# Patient Record
Sex: Male | Born: 1965
Health system: Southern US, Community
[De-identification: ages and names within clinical notes are randomized; demographics above are authoritative.]

## PROBLEM LIST (undated history)

## (undated) DIAGNOSIS — I2510 Atherosclerotic heart disease of native coronary artery without angina pectoris: Secondary | ICD-10-CM

## (undated) DIAGNOSIS — Z720 Tobacco use: Secondary | ICD-10-CM

## (undated) DIAGNOSIS — E119 Type 2 diabetes mellitus without complications: Secondary | ICD-10-CM

## (undated) DIAGNOSIS — E1169 Type 2 diabetes mellitus with other specified complication: Secondary | ICD-10-CM

## (undated) DIAGNOSIS — E785 Hyperlipidemia, unspecified: Secondary | ICD-10-CM

## (undated) HISTORY — DX: Type 2 diabetes mellitus without complications: E11.9

## (undated) HISTORY — DX: Type 2 diabetes mellitus with other specified complication: E11.69

## (undated) HISTORY — DX: Hyperlipidemia, unspecified: E78.5

---

## 2015-08-06 DIAGNOSIS — E785 Hyperlipidemia, unspecified: Secondary | ICD-10-CM

## 2015-08-06 DIAGNOSIS — E1169 Type 2 diabetes mellitus with other specified complication: Secondary | ICD-10-CM

## 2015-08-06 DIAGNOSIS — E119 Type 2 diabetes mellitus without complications: Secondary | ICD-10-CM

## 2015-08-06 HISTORY — DX: Type 2 diabetes mellitus without complications: E11.9

## 2015-08-06 HISTORY — DX: Type 2 diabetes mellitus with other specified complication: E11.69

## 2015-08-06 HISTORY — DX: Hyperlipidemia, unspecified: E78.5

## 2015-08-28 ENCOUNTER — Encounter (HOSPITAL_COMMUNITY)
Admission: EM | Disposition: A | Discharge status: Home / Self Care | Payer: Self-pay | Source: Home / Self Care | Attending: Cardiothoracic Surgery

## 2015-08-28 ENCOUNTER — Inpatient Hospital Stay (HOSPITAL_BASED_OUTPATIENT_CLINIC_OR_DEPARTMENT_OTHER)
Admission: EM | Admit: 2015-08-28 | Discharge: 2015-09-05 | DRG: 234 | Disposition: A | Discharge status: Home / Self Care | Payer: BLUE CROSS/BLUE SHIELD | Attending: Cardiothoracic Surgery | Admitting: Cardiothoracic Surgery

## 2015-08-28 ENCOUNTER — Inpatient Hospital Stay (HOSPITAL_COMMUNITY): Payer: BLUE CROSS/BLUE SHIELD

## 2015-08-28 ENCOUNTER — Encounter (HOSPITAL_BASED_OUTPATIENT_CLINIC_OR_DEPARTMENT_OTHER): Payer: Self-pay | Admitting: Emergency Medicine

## 2015-08-28 ENCOUNTER — Other Ambulatory Visit: Payer: Self-pay | Admitting: *Deleted

## 2015-08-28 ENCOUNTER — Emergency Department (HOSPITAL_BASED_OUTPATIENT_CLINIC_OR_DEPARTMENT_OTHER): Payer: BLUE CROSS/BLUE SHIELD

## 2015-08-28 DIAGNOSIS — J90 Pleural effusion, not elsewhere classified: Secondary | ICD-10-CM | POA: Diagnosis not present

## 2015-08-28 DIAGNOSIS — I209 Angina pectoris, unspecified: Secondary | ICD-10-CM

## 2015-08-28 DIAGNOSIS — R079 Chest pain, unspecified: Secondary | ICD-10-CM

## 2015-08-28 DIAGNOSIS — E8770 Fluid overload, unspecified: Secondary | ICD-10-CM | POA: Diagnosis not present

## 2015-08-28 DIAGNOSIS — D62 Acute posthemorrhagic anemia: Secondary | ICD-10-CM | POA: Diagnosis not present

## 2015-08-28 DIAGNOSIS — I2510 Atherosclerotic heart disease of native coronary artery without angina pectoris: Secondary | ICD-10-CM

## 2015-08-28 DIAGNOSIS — Z720 Tobacco use: Secondary | ICD-10-CM

## 2015-08-28 DIAGNOSIS — R0602 Shortness of breath: Secondary | ICD-10-CM | POA: Diagnosis not present

## 2015-08-28 DIAGNOSIS — R072 Precordial pain: Secondary | ICD-10-CM | POA: Diagnosis not present

## 2015-08-28 DIAGNOSIS — E119 Type 2 diabetes mellitus without complications: Secondary | ICD-10-CM | POA: Diagnosis present

## 2015-08-28 DIAGNOSIS — R0789 Other chest pain: Secondary | ICD-10-CM | POA: Diagnosis not present

## 2015-08-28 DIAGNOSIS — I080 Rheumatic disorders of both mitral and aortic valves: Secondary | ICD-10-CM | POA: Diagnosis not present

## 2015-08-28 DIAGNOSIS — Z8249 Family history of ischemic heart disease and other diseases of the circulatory system: Secondary | ICD-10-CM | POA: Diagnosis not present

## 2015-08-28 DIAGNOSIS — J9811 Atelectasis: Secondary | ICD-10-CM | POA: Diagnosis not present

## 2015-08-28 DIAGNOSIS — Z01818 Encounter for other preprocedural examination: Secondary | ICD-10-CM | POA: Diagnosis not present

## 2015-08-28 DIAGNOSIS — I200 Unstable angina: Secondary | ICD-10-CM | POA: Insufficient documentation

## 2015-08-28 DIAGNOSIS — E1165 Type 2 diabetes mellitus with hyperglycemia: Secondary | ICD-10-CM

## 2015-08-28 DIAGNOSIS — E785 Hyperlipidemia, unspecified: Secondary | ICD-10-CM | POA: Diagnosis not present

## 2015-08-28 DIAGNOSIS — F17210 Nicotine dependence, cigarettes, uncomplicated: Secondary | ICD-10-CM | POA: Diagnosis not present

## 2015-08-28 DIAGNOSIS — J449 Chronic obstructive pulmonary disease, unspecified: Secondary | ICD-10-CM | POA: Diagnosis present

## 2015-08-28 DIAGNOSIS — IMO0002 Reserved for concepts with insufficient information to code with codable children: Secondary | ICD-10-CM

## 2015-08-28 DIAGNOSIS — I252 Old myocardial infarction: Secondary | ICD-10-CM

## 2015-08-28 DIAGNOSIS — Z951 Presence of aortocoronary bypass graft: Secondary | ICD-10-CM

## 2015-08-28 DIAGNOSIS — I214 Non-ST elevation (NSTEMI) myocardial infarction: Secondary | ICD-10-CM | POA: Diagnosis not present

## 2015-08-28 DIAGNOSIS — I517 Cardiomegaly: Secondary | ICD-10-CM | POA: Diagnosis not present

## 2015-08-28 DIAGNOSIS — I25110 Atherosclerotic heart disease of native coronary artery with unstable angina pectoris: Secondary | ICD-10-CM | POA: Diagnosis present

## 2015-08-28 HISTORY — DX: Tobacco use: Z72.0

## 2015-08-28 HISTORY — PX: CARDIAC CATHETERIZATION: SHX172

## 2015-08-28 LAB — COMPREHENSIVE METABOLIC PANEL
ALT: 20 U/L (ref 17–63)
AST: 23 U/L (ref 15–41)
Albumin: 3.5 g/dL (ref 3.5–5.0)
Alkaline Phosphatase: 87 U/L (ref 38–126)
Anion gap: 7 (ref 5–15)
BUN: 13 mg/dL (ref 6–20)
CHLORIDE: 106 mmol/L (ref 101–111)
CO2: 22 mmol/L (ref 22–32)
CREATININE: 0.85 mg/dL (ref 0.61–1.24)
Calcium: 9.2 mg/dL (ref 8.9–10.3)
GFR calc non Af Amer: >60 mL/min (ref >60)
Glucose, Bld: 243 mg/dL — ABNORMAL HIGH (ref 65–99)
POTASSIUM: 3.7 mmol/L (ref 3.5–5.1)
SODIUM: 135 mmol/L (ref 135–145)
Total Bilirubin: 0.7 mg/dL (ref 0.3–1.2)
Total Protein: 6.7 g/dL (ref 6.5–8.1)

## 2015-08-28 LAB — SURGICAL PCR SCREEN
MRSA, PCR: NEGATIVE
Staphylococcus aureus: NEGATIVE

## 2015-08-28 LAB — CBC WITH DIFFERENTIAL/PLATELET
Basophils Absolute: 0.1 10*3/uL (ref 0.0–0.1)
Basophils Relative: 1 %
EOS ABS: 0.5 10*3/uL (ref 0.0–0.7)
Eosinophils Relative: 5 %
HCT: 44.7 % (ref 39.0–52.0)
HEMOGLOBIN: 15.7 g/dL (ref 13.0–17.0)
LYMPHS ABS: 3.2 10*3/uL (ref 0.7–4.0)
LYMPHS PCT: 30 %
MCH: 30.7 pg (ref 26.0–34.0)
MCHC: 35.1 g/dL (ref 30.0–36.0)
MCV: 87.5 fL (ref 78.0–100.0)
MONOS PCT: 7 %
Monocytes Absolute: 0.7 10*3/uL (ref 0.1–1.0)
NEUTROS PCT: 57 %
Neutro Abs: 6.3 10*3/uL (ref 1.7–7.7)
Platelets: 362 10*3/uL (ref 150–400)
RBC: 5.11 MIL/uL (ref 4.22–5.81)
RDW: 13.6 % (ref 11.5–15.5)
WBC: 10.9 10*3/uL — AB (ref 4.0–10.5)

## 2015-08-28 LAB — APTT: APTT: 29 s (ref 24–37)

## 2015-08-28 LAB — ECHOCARDIOGRAM COMPLETE
Height: 65 in
Weight: 3120 oz

## 2015-08-28 LAB — TSH: TSH: 1.742 u[IU]/mL (ref 0.350–4.500)

## 2015-08-28 LAB — URINALYSIS, ROUTINE W REFLEX MICROSCOPIC
Bilirubin Urine: NEGATIVE
Glucose, UA: 500 mg/dL — AB
Hgb urine dipstick: NEGATIVE
Ketones, ur: NEGATIVE mg/dL
Leukocytes, UA: NEGATIVE
Nitrite: NEGATIVE
Protein, ur: NEGATIVE mg/dL
Specific Gravity, Urine: 1.021 (ref 1.005–1.030)
pH: 5.5 (ref 5.0–8.0)

## 2015-08-28 LAB — MAGNESIUM: MAGNESIUM: 1.7 mg/dL (ref 1.7–2.4)

## 2015-08-28 LAB — TROPONIN I
TROPONIN I: 0.31 ng/mL — AB (ref <0.031)
Troponin I: <0.03 ng/mL (ref <0.031)
Troponin I: 0.17 ng/mL — ABNORMAL HIGH (ref <0.031)
Troponin I: 0.18 ng/mL — ABNORMAL HIGH (ref <0.031)

## 2015-08-28 LAB — GLUCOSE, CAPILLARY
GLUCOSE-CAPILLARY: 203 mg/dL — AB (ref 65–99)
Glucose-Capillary: 125 mg/dL — ABNORMAL HIGH (ref 65–99)

## 2015-08-28 LAB — PROTIME-INR
INR: 1.02 (ref 0.00–1.49)
PROTHROMBIN TIME: 13.6 s (ref 11.6–15.2)

## 2015-08-28 LAB — MRSA PCR SCREENING: MRSA by PCR: NEGATIVE

## 2015-08-28 SURGERY — LEFT HEART CATH AND CORONARY ANGIOGRAPHY

## 2015-08-28 MED ORDER — MORPHINE SULFATE (PF) 2 MG/ML IV SOLN: 2.0000 mg | INTRAVENOUS | Status: DC | PRN

## 2015-08-28 MED ORDER — SODIUM CHLORIDE 0.9 % IV SOLN
INTRAVENOUS | Status: DC
  Administered 2015-08-28: 15:00:00 via INTRAVENOUS

## 2015-08-28 MED ORDER — ATORVASTATIN CALCIUM 80 MG PO TABS
80.0000 mg | ORAL_TABLET | Freq: Every day | ORAL | Status: DC
  Administered 2015-08-28 – 2015-09-04 (×7): 80 mg via ORAL
  Filled 2015-08-28 (×7): qty 1

## 2015-08-28 MED ORDER — HEPARIN (PORCINE) IN NACL 100-0.45 UNIT/ML-% IJ SOLN
1600.0000 [IU]/h | INTRAMUSCULAR | Status: DC
  Administered 2015-08-28: 950 [IU]/h via INTRAVENOUS
  Administered 2015-08-29: 1150 [IU]/h via INTRAVENOUS
  Administered 2015-08-30: 1600 [IU]/h via INTRAVENOUS
  Filled 2015-08-28 (×2): qty 250

## 2015-08-28 MED ORDER — SODIUM CHLORIDE 0.9 % IV SOLN
INTRAVENOUS | Status: DC | PRN
  Administered 2015-08-28: 75 mL/h via INTRAVENOUS
  Administered 2015-08-28: 250 mL

## 2015-08-28 MED ORDER — HEPARIN SODIUM (PORCINE) 1000 UNIT/ML IJ SOLN
INTRAMUSCULAR | Status: DC | PRN
  Administered 2015-08-28: 4000 [IU] via INTRAVENOUS

## 2015-08-28 MED ORDER — SODIUM CHLORIDE 0.9% FLUSH
3.0000 mL | Freq: Two times a day (BID) | INTRAVENOUS | Status: DC
  Administered 2015-08-28 – 2015-08-29 (×3): 3 mL via INTRAVENOUS

## 2015-08-28 MED ORDER — ALPRAZOLAM 0.5 MG PO TABS
1.0000 mg | ORAL_TABLET | Freq: Three times a day (TID) | ORAL | Status: DC | PRN
  Administered 2015-08-28 – 2015-08-29 (×2): 1 mg via ORAL
  Filled 2015-08-28 (×3): qty 2

## 2015-08-28 MED ORDER — VERAPAMIL HCL 2.5 MG/ML IV SOLN
INTRA_ARTERIAL | Status: DC | PRN
  Administered 2015-08-28: 3 mL via INTRA_ARTERIAL

## 2015-08-28 MED ORDER — ALBUTEROL SULFATE (2.5 MG/3ML) 0.083% IN NEBU
3.0000 mL | INHALATION_SOLUTION | RESPIRATORY_TRACT | Status: DC
  Administered 2015-08-28 – 2015-08-30 (×8): 3 mL via RESPIRATORY_TRACT
  Filled 2015-08-28 (×8): qty 3

## 2015-08-28 MED ORDER — FENTANYL CITRATE (PF) 100 MCG/2ML IJ SOLN
INTRAMUSCULAR | Status: DC | PRN
  Administered 2015-08-28: 25 ug via INTRAVENOUS

## 2015-08-28 MED ORDER — ACETAMINOPHEN 325 MG PO TABS
650.0000 mg | ORAL_TABLET | Freq: Once | ORAL | Status: AC
  Administered 2015-08-28: 650 mg via ORAL

## 2015-08-28 MED ORDER — SODIUM CHLORIDE 0.9 % IV BOLUS (SEPSIS)
1000.0000 mL | Freq: Once | INTRAVENOUS | Status: AC
  Administered 2015-08-28: 1000 mL via INTRAVENOUS

## 2015-08-28 MED ORDER — FLUTICASONE FUROATE-VILANTEROL 200-25 MCG/INH IN AEPB
1.0000 | INHALATION_SPRAY | Freq: Every day | RESPIRATORY_TRACT | Status: DC
  Administered 2015-08-29: 1 via RESPIRATORY_TRACT
  Filled 2015-08-28 (×2): qty 28

## 2015-08-28 MED ORDER — NITROGLYCERIN 0.4 MG SL SUBL: 0.4000 mg | SUBLINGUAL_TABLET | SUBLINGUAL | Status: DC | PRN

## 2015-08-28 MED ORDER — SODIUM CHLORIDE 0.9% FLUSH: 3.0000 mL | INTRAVENOUS | Status: DC | PRN

## 2015-08-28 MED ORDER — HEPARIN BOLUS VIA INFUSION
4000.0000 [IU] | Freq: Once | INTRAVENOUS | Status: AC
Start: 2015-08-28 — End: 2015-08-28
  Administered 2015-08-28: 4000 [IU] via INTRAVENOUS

## 2015-08-28 MED ORDER — NITROGLYCERIN IN D5W 200-5 MCG/ML-% IV SOLN
0.0000 ug/min | Freq: Once | INTRAVENOUS | Status: AC
  Administered 2015-08-28: 5 ug/min via INTRAVENOUS
  Filled 2015-08-28: qty 250

## 2015-08-28 MED ORDER — ACETAMINOPHEN 325 MG PO TABS
ORAL_TABLET | ORAL | Status: AC
  Filled 2015-08-28: qty 2

## 2015-08-28 MED ORDER — INSULIN ASPART 100 UNIT/ML ~~LOC~~ SOLN
0.0000 [IU] | Freq: Every day | SUBCUTANEOUS | Status: DC
  Administered 2015-08-28: 2 [IU] via SUBCUTANEOUS

## 2015-08-28 MED ORDER — ONDANSETRON HCL 4 MG/2ML IJ SOLN: 4.0000 mg | Freq: Four times a day (QID) | INTRAMUSCULAR | Status: DC | PRN

## 2015-08-28 MED ORDER — ASPIRIN EC 81 MG PO TBEC
81.0000 mg | DELAYED_RELEASE_TABLET | Freq: Every day | ORAL | Status: DC
  Administered 2015-08-29: 81 mg via ORAL
  Filled 2015-08-28: qty 1

## 2015-08-28 MED ORDER — NITROGLYCERIN 0.4 MG SL SUBL
0.4000 mg | SUBLINGUAL_TABLET | SUBLINGUAL | Status: DC | PRN
  Administered 2015-08-28 (×2): 0.4 mg via SUBLINGUAL
  Filled 2015-08-28: qty 1

## 2015-08-28 MED ORDER — HEPARIN SODIUM (PORCINE) 1000 UNIT/ML IJ SOLN
INTRAMUSCULAR | Status: AC
  Filled 2015-08-28: qty 1

## 2015-08-28 MED ORDER — VERAPAMIL HCL 2.5 MG/ML IV SOLN
INTRAVENOUS | Status: AC
  Filled 2015-08-28: qty 2

## 2015-08-28 MED ORDER — MIDAZOLAM HCL 2 MG/2ML IJ SOLN
INTRAMUSCULAR | Status: DC | PRN
  Administered 2015-08-28: 1 mg via INTRAVENOUS

## 2015-08-28 MED ORDER — HEPARIN (PORCINE) IN NACL 2-0.9 UNIT/ML-% IJ SOLN
INTRAMUSCULAR | Status: DC | PRN
  Administered 2015-08-28: 1500 mL

## 2015-08-28 MED ORDER — NITROGLYCERIN IN D5W 200-5 MCG/ML-% IV SOLN: 0.0000 ug/min | INTRAVENOUS | Status: DC

## 2015-08-28 MED ORDER — ACETAMINOPHEN 325 MG PO TABS: 650.0000 mg | ORAL_TABLET | ORAL | Status: DC | PRN

## 2015-08-28 MED ORDER — HEPARIN (PORCINE) IN NACL 100-0.45 UNIT/ML-% IJ SOLN
950.0000 [IU]/h | INTRAMUSCULAR | Status: DC
  Administered 2015-08-28: 950 [IU]/h via INTRAVENOUS

## 2015-08-28 MED ORDER — HEPARIN (PORCINE) IN NACL 2-0.9 UNIT/ML-% IJ SOLN
INTRAMUSCULAR | Status: AC
  Filled 2015-08-28: qty 1500

## 2015-08-28 MED ORDER — INSULIN ASPART 100 UNIT/ML ~~LOC~~ SOLN
0.0000 [IU] | Freq: Three times a day (TID) | SUBCUTANEOUS | Status: DC
  Administered 2015-08-29 (×3): 3 [IU] via SUBCUTANEOUS

## 2015-08-28 MED ORDER — ALUM & MAG HYDROXIDE-SIMETH 200-200-20 MG/5ML PO SUSP: 30.0000 mL | ORAL | Status: DC | PRN

## 2015-08-28 MED ORDER — SODIUM CHLORIDE 0.9 % WEIGHT BASED INFUSION
1.0000 mL/kg/h | INTRAVENOUS | Status: DC
  Administered 2015-08-28: 1 mL/kg/h via INTRAVENOUS

## 2015-08-28 MED ORDER — NICOTINE 14 MG/24HR TD PT24
14.0000 mg | MEDICATED_PATCH | Freq: Every day | TRANSDERMAL | Status: DC
  Administered 2015-08-28 – 2015-09-04 (×7): 14 mg via TRANSDERMAL
  Filled 2015-08-28 (×8): qty 1

## 2015-08-28 MED ORDER — MIDAZOLAM HCL 2 MG/2ML IJ SOLN
INTRAMUSCULAR | Status: AC
  Filled 2015-08-28: qty 2

## 2015-08-28 MED ORDER — ASPIRIN 81 MG PO CHEW: 81.0000 mg | CHEWABLE_TABLET | ORAL | Status: DC

## 2015-08-28 MED ORDER — HEPARIN (PORCINE) IN NACL 100-0.45 UNIT/ML-% IJ SOLN
INTRAMUSCULAR | Status: AC
  Filled 2015-08-28: qty 250

## 2015-08-28 MED ORDER — ACETAMINOPHEN 325 MG PO TABS
650.0000 mg | ORAL_TABLET | ORAL | Status: DC | PRN
  Administered 2015-08-28 – 2015-08-29 (×5): 650 mg via ORAL
  Filled 2015-08-28 (×5): qty 2

## 2015-08-28 MED ORDER — LIDOCAINE HCL (PF) 1 % IJ SOLN
INTRAMUSCULAR | Status: AC
  Filled 2015-08-28: qty 30

## 2015-08-28 MED ORDER — SODIUM CHLORIDE 0.9 % IV SOLN: 250.0000 mL | INTRAVENOUS | Status: DC | PRN

## 2015-08-28 MED ORDER — IOPAMIDOL (ISOVUE-370) INJECTION 76%
INTRAVENOUS | Status: AC
  Filled 2015-08-28: qty 100

## 2015-08-28 MED ORDER — SODIUM CHLORIDE 0.9% FLUSH
3.0000 mL | INTRAVENOUS | Status: DC | PRN
Start: 2015-08-28 — End: 2015-08-30

## 2015-08-28 MED ORDER — SODIUM CHLORIDE 0.9 % IV SOLN
250.0000 mL | INTRAVENOUS | Status: DC | PRN
Start: 2015-08-28 — End: 2015-08-30

## 2015-08-28 MED ORDER — FENTANYL CITRATE (PF) 100 MCG/2ML IJ SOLN
INTRAMUSCULAR | Status: AC
  Filled 2015-08-28: qty 2

## 2015-08-28 MED ORDER — ATORVASTATIN CALCIUM 80 MG PO TABS: 80.0000 mg | ORAL_TABLET | Freq: Every day | ORAL | Status: DC

## 2015-08-28 MED ORDER — SODIUM CHLORIDE 0.9% FLUSH: 3.0000 mL | Freq: Two times a day (BID) | INTRAVENOUS | Status: DC

## 2015-08-28 MED ORDER — ASPIRIN 81 MG PO CHEW: 81.0000 mg | CHEWABLE_TABLET | Freq: Every day | ORAL | Status: DC

## 2015-08-28 MED ORDER — ASPIRIN 81 MG PO CHEW
324.0000 mg | CHEWABLE_TABLET | Freq: Once | ORAL | Status: AC
  Administered 2015-08-28: 324 mg via ORAL
  Filled 2015-08-28: qty 4

## 2015-08-28 MED ORDER — IOPAMIDOL (ISOVUE-370) INJECTION 76%
INTRAVENOUS | Status: DC | PRN
  Administered 2015-08-28: 60 mL via INTRAVENOUS

## 2015-08-28 MED ORDER — NITROGLYCERIN 1 MG/10 ML FOR IR/CATH LAB
INTRA_ARTERIAL | Status: AC
  Filled 2015-08-28: qty 10

## 2015-08-28 SURGICAL SUPPLY — 12 items
CATH INFINITI 5 FR JL3.5 (CATHETERS) ×1 IMPLANT
CATH INFINITI 5FR ANG PIGTAIL (CATHETERS) ×1 IMPLANT
CATH OPTITORQUE TIG 4.0 5F (CATHETERS) ×1 IMPLANT
DEVICE RAD COMP TR BAND LRG (VASCULAR PRODUCTS) ×1 IMPLANT
GLIDESHEATH SLEND A-KIT 6F 22G (SHEATH) ×1 IMPLANT
KIT HEART LEFT (KITS) ×2 IMPLANT
PACK CARDIAC CATHETERIZATION (CUSTOM PROCEDURE TRAY) ×2 IMPLANT
SYR MEDRAD MARK V 150ML (SYRINGE) ×2 IMPLANT
TRANSDUCER W/STOPCOCK (MISCELLANEOUS) ×2 IMPLANT
TUBING CIL FLEX 10 FLL-RA (TUBING) ×2 IMPLANT
WIRE HI TORQ VERSACORE-J 145CM (WIRE) ×1 IMPLANT
WIRE SAFE-T 1.5MM-J .035X260CM (WIRE) ×1 IMPLANT

## 2015-08-28 NOTE — ED Notes (Signed)
Justin StanleyLisa (Wife) DESIGNATED health care surrogate :  Cell : [phone]   2nd Benito MccreedyWanda Edmonds (mother in law)\ Cell : [phone]

## 2015-08-28 NOTE — Progress Notes (Signed)
ANTICOAGULATION CONSULT NOTE - Initial Consult  Pharmacy Consult for heparin Indication: chest pain/ACS  No Known Allergies  Patient Measurements: Height: 5\' 4"  (162.6 cm) Weight: 195 lb (88.451 kg) IBW/kg (Calculated) : 59.2 Heparin Dosing Weight: 78.3 kg  Vital Signs: Temp: 97.8 F (36.6 C) (05/23 0733) Temp Source: Oral (05/23 0733) BP: 124/81 mmHg (05/23 0733) Pulse Rate: 71 (05/23 0733)  Labs: No results for input(s): HGB, HCT, PLT, APTT, LABPROT, INR, HEPARINUNFRC, HEPRLOWMOCWT, CREATININE, CKTOTAL, CKMB, TROPONINI in the last 72 hours.  CrCl cannot be calculated (Patient has no serum creatinine result on file.).   Medical History: History reviewed. No pertinent past medical history.   Assessment: 49 yom with CP. Pharmacy consulted to dose heparin for ACS. CBC wnl, no bleed documented. [facility].  Goal of Therapy:  Heparin level 0.3-0.7 units/ml Monitor platelets by anticoagulation protocol: Yes   Plan:  Heparin 4000 unit bolus Start heparin at 950 units/h 6h HL Daily HL/CBC Mon s/sx bleeding   Babs BertinHaley Del Wiseman, PharmD, [facility] CenterBCPS Clinical Pharmacist Pager [phone] 08/28/2015 7:58 AM

## 2015-08-28 NOTE — ED Notes (Signed)
[facility], Dr. Clydene PughKnott is sending and Dr. Allyson SabalBerry is receiving.  [facility] is aware of bed and preparing for transporting patient to [facility].

## 2015-08-28 NOTE — Interval H&P Note (Signed)
Cath Lab Visit (complete for each Cath Lab visit)  Clinical Evaluation Leading to the Procedure:   ACS: Yes.    Non-ACS:    Anginal Classification: CCS III  Anti-ischemic medical therapy: No Therapy  Non-Invasive Test Results: No non-invasive testing performed  Prior CABG: No previous CABG      History and Physical Interval Note:  08/28/2015 3:04 PM  Justin Olson  has presented today for surgery, with the diagnosis of cp  The various methods of treatment have been discussed with the patient and family. After consideration of risks, benefits and other options for treatment, the patient has consented to  Procedure(s): Left Heart Cath and Coronary Angiography (N/A) as a surgical intervention .  The patient's history has been reviewed, patient examined, no change in status, stable for surgery.  I have reviewed the patient's chart and labs.  Questions were answered to the patient's satisfaction.     [name], [name]

## 2015-08-28 NOTE — ED Notes (Signed)
Called Robin--bed placement --no beds.  Recalled Cardio on patient for Dr. Clydene PughKnott

## 2015-08-28 NOTE — Progress Notes (Signed)
Gave the patient the postoperative cardiac surgery booklet.

## 2015-08-28 NOTE — ED Provider Notes (Addendum)
CSN: [id number]     Arrival date & time 08/28/15  4098 History   First MD Initiated Contact with Patient 08/28/15 [phone]     Chief Complaint  Patient presents with  . Chest Pain     (Consider location/radiation/quality/duration/timing/severity/associated sxs/prior Treatment) Patient is a 50 y.o. male presenting with chest pain. The history is provided by the patient.  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 No pertinent past medical history. History reviewed. No pertinent past surgical history. History reviewed. No pertinent family history. Social History  Substance Use Topics  . Smoking status: Current Every Day Smoker -- 1.00 packs/day    Types: Cigarettes  . Smokeless tobacco: None  . Alcohol Use: Yes     Comment: only weekends 3-4 beers entire weekend    Review of Systems  Constitutional: Positive for diaphoresis. Negative for fever.  Respiratory: Positive for shortness of breath (towards end of episode).   Cardiovascular: Positive for chest pain.  Gastrointestinal: Positive for nausea (with gagging). Negative for abdominal pain.  Musculoskeletal: Negative for back pain.  All other systems reviewed and are negative.     Allergies   Review of patient's allergies indicates no known allergies.  Home Medications   Prior to Admission medications   Not on File   BP 124/81 mmHg  Pulse 71  Temp(Src) 97.8 F (36.6 C) (Oral)  Resp 16  Ht 5\' 4"  (1.626 m)  Wt 195 lb (88.451 kg)  BMI 33.46 kg/m2  SpO2 95% Physical Exam  Constitutional: He is oriented to person, place, and time. He appears well-developed and well-nourished. No distress.  HENT:  Head: Normocephalic and atraumatic.  Eyes: Conjunctivae are normal.  Neck: Neck supple. No tracheal deviation present.  Cardiovascular: Normal rate, regular rhythm and normal heart sounds.  Exam reveals no gallop and no friction rub.   No murmur heard. Pulmonary/Chest: Effort normal and breath sounds normal. No respiratory distress. He has no wheezes. He has no rales.  Abdominal: Soft. He exhibits no distension. There is no tenderness.  Neurological: He is alert and oriented to person, place, and time.  Skin: Skin is warm and dry.  Psychiatric: He has a normal mood and affect.  Vitals reviewed.   ED Course  Procedures (including critical care time)  CRITICAL CARE Performed by: Lyndal Pulley Total critical care time: 30 minutes Critical care time was exclusive of separately billable procedures and treating other patients. Critical care was necessary to treat or prevent imminent or life-threatening deterioration. Critical care was time spent personally by me on the following activities: development of treatment plan with patient and/or surrogate as well as nursing, discussions with consultants, evaluation of patient's response to treatment, examination of patient, obtaining history from patient or surrogate, ordering and  performing treatments and interventions, ordering and review of laboratory studies, ordering and review of radiographic studies, pulse oximetry and re-evaluation of patient's condition.   Emergency Focused Ultrasound Exam Limited Ultrasound of the Heart and  Pericardium  Performed and interpreted by Dr. Clydene PughKnott Indication: chest pain Multiple views of the heart, pericardium, and IVC are obtained with a multi frequency probe.  Findings: nml contractility, no anechoic fluid, complete IVC collapse Interpretation: nml ejection fraction, no pericardial effusion, depressed CVP Images archived electronically.  CPT Code: 1610993308   Labs Review Labs Reviewed  COMPREHENSIVE METABOLIC PANEL - Abnormal; Notable for the following:    Glucose, Bld 243 (*)    All other components within normal limits  CBC WITH DIFFERENTIAL/PLATELET - Abnormal; Notable for the following:    WBC 10.9 (*)    All other components within normal limits  TROPONIN I  MAGNESIUM  APTT  PROTIME-INR  TSH  HEPARIN LEVEL (UNFRACTIONATED)    Imaging Review Dg Chest Port 1 View  08/28/2015  CLINICAL DATA:  Chest pain and pressure, heaviness, shortness of breath, numbness LEFT arm, smoker EXAM: PORTABLE CHEST 1 VIEW COMPARISON:  Portable exam 0734 hours without priors for comparison FINDINGS: Normal heart size, mediastinal contours and pulmonary vascularity. Lungs grossly clear. No pleural effusion or pneumothorax. Bones unremarkable. IMPRESSION: No definite acute abnormalities. Electronically Signed   By: Ulyses SouthwardMark  Boles M.D.   On: 08/28/2015 08:01   I have personally reviewed and evaluated these images and lab results as part of my medical decision-making.   EKG Interpretation   Date/Time:  Tuesday Aug 28 2015 07:32:44 EDT Ventricular Rate:  66 PR Interval:  147 QRS Duration: 95 QT Interval:  390 QTC Calculation: 409 R Axis:   83 Text Interpretation:  Sinus rhythm nonspecific repolarization  abnormalities Inferolateral leads Inferior Q waves noted No previous  tracing Confirmed by Dajour Pierpoint MD, Cloria Ciresi [phone]([id number]) on 08/28/2015 7:37:21 AM    EKG Interpretation  Date/Time:  Tuesday Aug 28 2015 08:19:39 EDT Ventricular Rate:  60 PR Interval:  156 QRS Duration: 93 QT  Interval:  417 QTC Calculation: 417 R Axis:   64 Text Interpretation:  Sinus rhythm repolarization abnormalities and inversion in III improved from previous deepening q waves inferiorly compared to previous Confirmed by Hayde Kilgour MD, Johnathin Vanderschaaf ([id number]) on 08/28/2015 8:34:26 AM        MDM   Final diagnoses:  Unstable angina (HCC)    50 y.o. 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 It was described as severe substernal crushing pressure with left arm numbness and radiation to the left neck. He became diaphoretic and starting gagging with nausea. He then became short of breath and when pains persisted he went over to his boss to tell him he did not feel well. After a short rest his symptoms subsided and on arrival he has pain down to a level of 1. Initial EKG is concerning for nonspecific findings without previous EKG available for review. Patient was given aspirin and heparinized with high suspicion for unstable angina pattern and ACS. Initial troponin is negative. After 2 rounds of nitroglycerin he has some dynamic changes on his EKG suggesting recent myocardial injury pattern, cardiology was consulted and accepted the patient in transfer to stepdown on titratable IV nitroglycerin for further evaluation of ACS.  Second troponin is positive at 2 hours of ED course, patient will likely benefit from urgent catheterization.   Lyndal Pulleyaniel Eldon Zietlow, MD  08/28/15 0846  Delay in transport secondary to lack of bed availability. Patient re-triaged to ICU bed to help expedite time to emergent cardiology evaluation.   Lyndal Pulley, MD 08/28/15 [phone]  Prior to transfer  EKG Interpretation  Date/Time:  Tuesday Aug 28 2015 10:12:11 EDT Ventricular Rate:  50 PR Interval:  161 QRS Duration: 107 QT Interval:  439 QTC Calculation: 400 R Axis:   72 Text Interpretation:  Sinus rhythm repolarization abnormalities resolved from previous  Inferior infarct evident from worsening inferior Q waves Confirmed by Samika Vetsch MD, Asriel Westrup ([id number]) on 08/28/2015 10:15:52 AM        Lyndal Pulley, MD 08/28/15 1018  Lyndal Pulley, MD 08/28/15 1021

## 2015-08-28 NOTE — Progress Notes (Signed)
Day of Surgery Procedure(s) (LRB): Left Heart Cath and Coronary Angiography (N/A) Subjective: Patient examined and coronary angiograms reviewed and counseled with patient Stable on iv NTG with heparin to start soon Echo shows good LV fx Carotid dopplers pending Objective: Vital signs in last 24 hours: Temp:  [97.3 F (36.3 C)-97.8 F (36.6 C)] 97.6 F (36.4 C) (05/23 1551) Pulse Rate:  [43-71] 61 (05/23 1900) Cardiac Rhythm:  [-] Sinus bradycardia (05/23 1600) Resp:  [10-21] 19 (05/23 1830) BP: (97-131)/(60-86) 111/75 mmHg (05/23 1900) SpO2:  [92 %-100 %] 97 % (05/23 1900) Weight:  [188 lb 0.8 oz (85.3 kg)-195 lb (88.451 kg)] 188 lb 0.8 oz (85.3 kg) (05/23 1100)  Hemodynamic parameters for last 24 hours:  nsr  Intake/Output from previous day:   Intake/Output this shift:         Exam    General- alert and comfortable   Lungs- clear without rales, wheezes   Cor- regular rate and rhythm, no murmur , gallop   Abdomen- soft, non-tender   Extremities - warm, non-tender, minimal edema   Neuro- oriented, appropriate, no focal weakness   Lab Results:  Recent Labs  08/28/15 0744  WBC 10.9*  HGB 15.7  HCT 44.7  PLT 362   BMET:  Recent Labs  08/28/15 0744  NA 135  K 3.7  CL 106  CO2 22  GLUCOSE 243*  BUN 13  CREATININE 0.85  CALCIUM 9.2    PT/INR:  Recent Labs  08/28/15 0744  LABPROT 13.6  INR 1.02   ABG No results found for: PHART, HCO3, TCO2, ACIDBASEDEF, O2SAT CBG (last 3)   Recent Labs  08/28/15 1117  GLUCAP 125*    Assessment/Plan: S/P Procedure(s) (LRB): Left Heart Cath and Coronary Angiography (N/A) CABG 08-30-15  0700 Procedure discussed with patient  LOS: 0 days    Kathlee Nationseter Van Trigt III 08/28/2015

## 2015-08-28 NOTE — H&P (Signed)
Patient ID: Justin Olson MRN: [id number], DOB/AGE: 08-May-1965   Admit date: 08/28/2015   Primary Physician: No primary care provider on file. Primary Cardiologist: New (Dr. Allyson Sabal)  Pt. Profile:  50 year old Caucasian male with past medical history of heavy tobacco use and family history of CAD but no other known risk factors, transferred from [facility] for unstable angina/non-STEMI.  Problem List  Past Medical History  Diagnosis Date  . Tobacco abuse     History reviewed. No pertinent past surgical history.   Allergies  No Known Allergies  HPI  50 year old Caucasian male with past medical history of heavy tobacco use and family history of CAD but no other known risk factors transferred for [facility] for unstable angina/non-STEMI.   He does not seek routine preventative care. He is not followed by a PCP and has not seen a provider in several years. He is not on any prescription medications. His family history is notable for CAD. His father died from a myocardial infarction. His sister also has CAD and is status post CABG 5. He has been smoking since the age 50. He smokes on average a pack per day.  He was in his usual state of health until earlier this morning when he developed severe substernal chest discomfort. Occurred while he was at work. He was lifting heavy boxes into at truck. He describes the sensation as heaviness/pressure. It felt like "5 elephant sitting on my chest". This was accompanied by diffuse diaphoresis and nausea. Also mild dyspnea. The pain was severe prompting him to present to [facility]. EKG showed nonspecific ST abnormalities. Initial troponin was abnormal at 0.17. Subsequently he was started on IV heparin and IV nitroglycerin and transferred to [facility].  On arrival he is currently chest pain-free. Blood pressure is stable. Telemetry shows sinus bradycardia with a heart rate in the 40s however he is asymptomatic.  He is not on a beta blocker.   Home Medications  Prior to Admission medications   Not on File    Family History  Family History  Problem Relation Age of Onset  . Coronary artery disease Father     MI  . Coronary artery disease Sister     s/p CABG x5    Social History  Social History   Social History  . Marital Status: Single    Spouse Name: N/A  . Number of Children: N/A  . Years of Education: N/A   Occupational History  . Not on file.   Social History Main Topics  . Smoking status: Current Every Day Smoker -- 1.00 packs/day    Types: Cigarettes  . Smokeless tobacco: Not on file  . Alcohol Use: Yes     Comment: only weekends 3-4 beers entire weekend  . Drug Use: No  . Sexual Activity: Not on file   Other Topics Concern  . Not on file   Social History Narrative  . No narrative on file     Review of Systems General:  No chills, fever, night sweats or weight changes.  Cardiovascular:  No chest pain, dyspnea on exertion, edema, orthopnea, palpitations, paroxysmal nocturnal dyspnea. Dermatological: No rash, lesions/masses Respiratory: No cough, dyspnea Urologic: No hematuria, dysuria Abdominal:   No nausea, vomiting, diarrhea, bright red blood per rectum, melena, or hematemesis Neurologic:  No visual changes, wkns, changes in mental status. All other systems reviewed and are otherwise negative except as noted above.  Physical Exam  Blood  pressure 111/86, pulse 50, temperature 97.8 F (36.6 C), temperature source Oral, resp. rate 12, height  (1.626 m), weight 195 lb (88.451 kg), SpO2 99 %.  General: Pleasant, NAD Psych: Normal affect. Neuro: Alert and oriented X 3. Moves all extremities spontaneously. HEENT: Normal  Neck: Supple without bruits or JVD. Lungs:  Resp regular and unlabored, CTA. Heart: RRR no s3, s4, or murmurs. Abdomen: Soft, non-tender, non-distended, BS + x 4.  Extremities: No clubbing, cyanosis or edema. DP/PT/Radials 2+ and equal  bilaterally.  Labs  Troponin (Point of Care Test) No results for input(s): TROPIPOC in the last 72 hours.  Recent Labs  08/28/15 0744 08/28/15 0940  TROPONINI <0.03 0.17*   Lab Results  Component Value Date   WBC 10.9* 08/28/2015   HGB 15.7 08/28/2015   HCT 44.7 08/28/2015   MCV 87.5 08/28/2015   PLT 362 08/28/2015     Recent Labs Lab 08/28/15 0744  NA 135  K 3.7  CL 106  CO2 22  BUN 13  CREATININE 0.85  CALCIUM 9.2  PROT 6.7  BILITOT 0.7  ALKPHOS 87  ALT 20  AST 23  GLUCOSE 243*   No results found for: CHOL, HDL, LDLCALC, TRIG No results found for: DDIMER   Radiology/Studies  Dg Chest Port 1 View  08/28/2015  CLINICAL DATA:  Chest pain and pressure, heaviness, shortness of breath, numbness LEFT arm, smoker EXAM: PORTABLE CHEST 1 VIEW COMPARISON:  Portable exam 0734 hours without priors for comparison FINDINGS: Normal heart size, mediastinal contours and pulmonary vascularity. Lungs grossly clear. No pleural effusion or pneumothorax. Bones unremarkable. IMPRESSION: No definite acute abnormalities. Electronically Signed   By: Ulyses Southward M.D.   On: 08/28/2015 08:01    ECG  NSR with nonspecific ST abnormalities.     ASSESSMENT AND PLAN  1. ACS: Unstable Angina/NSTEMI. Tropoinin is minimally elevated at 0.17. He is currently chest pain-free with IV heparin and IV nitroglycerin. His symptomatology is consistent with acute coronary syndrome and he has multiple cardiac risk factors. We will plan for definitive left heart catheterization. We will try to complete this today. We will make him NPO. If unable to do cardiac catheterization today due to scheduling, we will plan to do in the a.m. Continue IV heparin and IV nitroglycerin. Start high intensity statin with Lipitor 80 mg/nightly. Check fasting lipid panel in the a.m. for baseline assessment of lipids. His heart rate will not tolerate addition of a beta blocker. He is currently sinus bradycardia with heart rate  in the 40s but he is asymptomatic. Blood pressure is also too soft at 106/65 to start ACE inhibitor or ARB. We will also screen for diabetes with a hemoglobin A1c. Smoking cessation was strongly advised. He will need further counseling on this. He will also need to establish care with a PCP following discharge. We will obtain a 2-D echocardiogram to assess LV function wall motion and valve anatomy.   Signed, Robbie Lis, PA-C 08/28/2015, 12:17 PM Agree with note written by Boyce Medici  PAC  50 y/o MWM with + CRF, 50 py tob and fam hx who had symptoms c/w [place] this AM. [facility] to [facility]. Rx with IV hep/NTG. Currently pain free. EKG w/o acute changes. Trop mildly elevated .17. Exam benign. Plan cor angio radially this afternoon. I have reviewed the risks, indications, and alternatives to cardiac catheterization, possible angioplasty, and stenting with the patient. Risks include but are not limited to bleeding, infection, vascular injury, stroke, myocardial  infection, arrhythmia, kidney injury, radiation-related injury in the case of prolonged fluoroscopy use, emergency cardiac surgery, and death. The patient understands the risks of serious complication is 1-2 in 1000 with diagnostic cardiac cath and 1-2% or less with angioplasty/stenting.    [name], Neeya Prigmore 08/28/2015 12:40 PM

## 2015-08-28 NOTE — Progress Notes (Signed)
Echocardiogram 2D Echocardiogram has been performed.  Nolon RodBrown, Tony 08/28/2015, 3:45 PM

## 2015-08-28 NOTE — ED Notes (Signed)
[age] yom smoker presents with c/c chest heaviness with numbness radiating to left arm. 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 He became sweatiness, nauseas and developed shortness of breath with cough.   Symptoms partially resolved after rest. No hx of similar discomfort.

## 2015-08-28 NOTE — Progress Notes (Signed)
   08/28/15 1600  Clinical Encounter Type  Visited With Patient;Family;Patient and family together;Health care provider  Visit Type Initial;Spiritual support;Social support;Critical Care  Referral From Nurse  Spiritual Encounters  Spiritual Needs Prayer;Emotional  Ch was requested by RN to visit pt and family as pt just found out about need for heart surgery; pt has anxiety and is scared and family is worried; CH available to follow-up as needed. Erline LevineMichael I Jammy Stlouis 4:21 PM

## 2015-08-28 NOTE — Progress Notes (Signed)
ANTICOAGULATION CONSULT NOTE - Follow Up Consult  Pharmacy Consult for Heparin Indication: Left main stenosis pending CABG  No Known Allergies  Patient Measurements: Height: 5\' 5"  (165.1 cm) Weight: 188 lb 0.8 oz (85.3 kg) IBW/kg (Calculated) : 61.5 Heparin Dosing Weight: 78kg  Vital Signs: Temp: 97.6 F (36.4 C) (05/23 1551) Temp Source: Oral (05/23 1551) BP: 107/63 mmHg (05/23 1400) Pulse Rate: 54 (05/23 1400)  Labs:  Recent Labs  08/28/15 0744 08/28/15 0940 08/28/15 1558  HGB 15.7  --   --   HCT 44.7  --   --   PLT 362  --   --   APTT 29  --   --   LABPROT 13.6  --   --   INR 1.02  --   --   CREATININE 0.85  --   --   TROPONINI <0.03 0.17* 0.31*    Estimated Creatinine Clearance: 105.6 mL/min (by C-G formula based on Cr of 0.85).  Assessment: 49yom started on IV heparin earlier today for chest pain. Now s/p cath found to have 95% left main stenosis. CVTS consulted for CABG. Heparin to be resumed 2 hours post TR band removal. TR band removed ~1745 per RN. No bleeding complications.  Goal of Therapy:  Heparin level 0.3-0.7 units/ml Monitor platelets by anticoagulation protocol: Yes   Plan:  1) At ~2000, resume heparin at 950 units/hr with no bolus 2) Check 6 hour heparin level 3) Daily heparin level and CBC 4) Follow up CABG plans  Justin Olson, Justin Olson 08/28/2015,6:09 PM

## 2015-08-28 NOTE — Care Management Note (Signed)
Case Management Note  Patient Details  Name: Justin Olson MRN: [id number] Date of Birth: 07-09-1965  Subjective/Objective:      Adm w nstemi              Action/Plan: lives w fam per chart   Expected Discharge Date:                  Expected Discharge Plan:  Home/Self Care  In-House Referral:     Discharge planning Services     Post Acute Care Choice:    Choice offered to:     DME Arranged:    DME Agency:     HH Arranged:    HH Agency:     Status of Service:     Medicare Important Message Given:    Date Medicare IM Given:    Medicare IM give by:    Date Additional Medicare IM Given:    Additional Medicare Important Message give by:     If discussed at Long Length of Stay Meetings, dates discussed:    Additional Comments: ur review done  Hanley HaysDowell, Andie Mortimer T, RN 08/28/2015, 2:33 PM

## 2015-08-29 ENCOUNTER — Encounter (HOSPITAL_COMMUNITY): Payer: Self-pay | Admitting: Cardiovascular Disease

## 2015-08-29 ENCOUNTER — Inpatient Hospital Stay (HOSPITAL_COMMUNITY): Payer: BLUE CROSS/BLUE SHIELD

## 2015-08-29 DIAGNOSIS — I214 Non-ST elevation (NSTEMI) myocardial infarction: Principal | ICD-10-CM

## 2015-08-29 DIAGNOSIS — E119 Type 2 diabetes mellitus without complications: Secondary | ICD-10-CM

## 2015-08-29 DIAGNOSIS — I2510 Atherosclerotic heart disease of native coronary artery without angina pectoris: Secondary | ICD-10-CM

## 2015-08-29 DIAGNOSIS — E1165 Type 2 diabetes mellitus with hyperglycemia: Secondary | ICD-10-CM

## 2015-08-29 DIAGNOSIS — E785 Hyperlipidemia, unspecified: Secondary | ICD-10-CM

## 2015-08-29 DIAGNOSIS — IMO0002 Reserved for concepts with insufficient information to code with codable children: Secondary | ICD-10-CM

## 2015-08-29 LAB — SPIROMETRY WITH GRAPH
FEF 25-75 Pre: 0.96 L/sec
FEF2575-%Pred-Pre: 31 %
FEV1-%Pred-Pre: 47 %
FEV1-Pre: 1.57 L
FEV1FVC-%Pred-Pre: 78 %
FEV6-%Pred-Pre: 61 %
FEV6-Pre: 2.52 L
FEV6FVC-%Pred-Pre: 102 %
FVC-%Pred-Pre: 60 %
FVC-Pre: 2.57 L
Pre FEV1/FVC ratio: 61 %
Pre FEV6/FVC Ratio: 98 %

## 2015-08-29 LAB — GLUCOSE, CAPILLARY
GLUCOSE-CAPILLARY: 172 mg/dL — AB (ref 65–99)
GLUCOSE-CAPILLARY: 163 mg/dL — AB (ref 65–99)
Glucose-Capillary: 156 mg/dL — ABNORMAL HIGH (ref 65–99)
Glucose-Capillary: 163 mg/dL — ABNORMAL HIGH (ref 65–99)

## 2015-08-29 LAB — BASIC METABOLIC PANEL
ANION GAP: 5 (ref 5–15)
BUN: 7 mg/dL (ref 6–20)
CALCIUM: 8.6 mg/dL — AB (ref 8.9–10.3)
CHLORIDE: 110 mmol/L (ref 101–111)
CO2: 24 mmol/L (ref 22–32)
Creatinine, Ser: 0.85 mg/dL (ref 0.61–1.24)
GFR calc non Af Amer: >60 mL/min (ref >60)
Glucose, Bld: 112 mg/dL — ABNORMAL HIGH (ref 65–99)
Potassium: 3.6 mmol/L (ref 3.5–5.1)
SODIUM: 139 mmol/L (ref 135–145)

## 2015-08-29 LAB — CBC
HEMATOCRIT: 39.5 % (ref 39.0–52.0)
HEMOGLOBIN: 13.2 g/dL (ref 13.0–17.0)
MCH: 29.6 pg (ref 26.0–34.0)
MCHC: 33.4 g/dL (ref 30.0–36.0)
MCV: 88.6 fL (ref 78.0–100.0)
Platelets: 328 10*3/uL (ref 150–400)
RBC: 4.46 MIL/uL (ref 4.22–5.81)
RDW: 13.4 % (ref 11.5–15.5)
WBC: 11.7 10*3/uL — AB (ref 4.0–10.5)

## 2015-08-29 LAB — LIPID PANEL
CHOLESTEROL: 189 mg/dL (ref 0–200)
HDL: 30 mg/dL — AB (ref >40)
LDL CALC: 124 mg/dL — AB (ref 0–99)
Total CHOL/HDL Ratio: 6.3 RATIO
Triglycerides: 175 mg/dL — ABNORMAL HIGH (ref <150)
VLDL: 35 mg/dL (ref 0–40)

## 2015-08-29 LAB — COMPREHENSIVE METABOLIC PANEL
ALT: 19 U/L (ref 17–63)
AST: 20 U/L (ref 15–41)
Albumin: 3.1 g/dL — ABNORMAL LOW (ref 3.5–5.0)
Alkaline Phosphatase: 78 U/L (ref 38–126)
Anion gap: 6 (ref 5–15)
BUN: 6 mg/dL (ref 6–20)
CO2: 24 mmol/L (ref 22–32)
Calcium: 8.9 mg/dL (ref 8.9–10.3)
Chloride: 109 mmol/L (ref 101–111)
Creatinine, Ser: 0.82 mg/dL (ref 0.61–1.24)
GFR calc Af Amer: >60 mL/min (ref >60)
GFR calc non Af Amer: >60 mL/min (ref >60)
Glucose, Bld: 175 mg/dL — ABNORMAL HIGH (ref 65–99)
Potassium: 3.7 mmol/L (ref 3.5–5.1)
Sodium: 139 mmol/L (ref 135–145)
Total Bilirubin: 0.4 mg/dL (ref 0.3–1.2)
Total Protein: 6 g/dL — ABNORMAL LOW (ref 6.5–8.1)

## 2015-08-29 LAB — POCT I-STAT 3, ART BLOOD GAS (G3+)
ACID-BASE DEFICIT: 3 mmol/L — AB (ref 0.0–2.0)
BICARBONATE: 21.4 meq/L (ref 20.0–24.0)
O2 SAT: 95 %
PH ART: 7.403 (ref 7.350–7.450)
PO2 ART: 74 mmHg — AB (ref 80.0–100.0)
Patient temperature: 97.5
TCO2: 22 mmol/L (ref 0–100)
pCO2 arterial: 34.1 mmHg — ABNORMAL LOW (ref 35.0–45.0)

## 2015-08-29 LAB — TSH: TSH: 2.224 u[IU]/mL (ref 0.350–4.500)

## 2015-08-29 LAB — HEPARIN LEVEL (UNFRACTIONATED)
Heparin Unfractionated: <0.1 IU/mL — ABNORMAL LOW (ref 0.30–0.70)
Heparin Unfractionated: 0.12 IU/mL — ABNORMAL LOW (ref 0.30–0.70)

## 2015-08-29 LAB — TROPONIN I: Troponin I: 0.09 ng/mL — ABNORMAL HIGH (ref <0.031)

## 2015-08-29 LAB — HEMOGLOBIN A1C
Hgb A1c MFr Bld: 7.9 % — ABNORMAL HIGH (ref 4.8–5.6)
Mean Plasma Glucose: 180 mg/dL

## 2015-08-29 LAB — PREPARE RBC (CROSSMATCH)

## 2015-08-29 LAB — ABO/RH: ABO/RH(D): A POS

## 2015-08-29 MED ORDER — MAGNESIUM SULFATE 50 % IJ SOLN
40.0000 meq | INTRAMUSCULAR | Status: DC
  Filled 2015-08-29: qty 10

## 2015-08-29 MED ORDER — CHLORHEXIDINE GLUCONATE 4 % EX LIQD
60.0000 mL | Freq: Once | CUTANEOUS | Status: AC
  Administered 2015-08-29: 4 via TOPICAL
  Filled 2015-08-29: qty 60

## 2015-08-29 MED ORDER — DEXMEDETOMIDINE HCL IN NACL 400 MCG/100ML IV SOLN
0.1000 ug/kg/h | INTRAVENOUS | Status: AC
  Administered 2015-08-30: .2 ug/kg/h via INTRAVENOUS
  Filled 2015-08-29: qty 100

## 2015-08-29 MED ORDER — PHENYLEPHRINE HCL 10 MG/ML IJ SOLN
30.0000 ug/min | INTRAVENOUS | Status: DC
  Filled 2015-08-29: qty 2

## 2015-08-29 MED ORDER — CHLORHEXIDINE GLUCONATE 4 % EX LIQD
60.0000 mL | Freq: Once | CUTANEOUS | Status: AC
Start: 2015-08-30 — End: 2015-08-30
  Administered 2015-08-30: 4 via TOPICAL
  Filled 2015-08-29: qty 60

## 2015-08-29 MED ORDER — DOPAMINE-DEXTROSE 3.2-5 MG/ML-% IV SOLN
0.0000 ug/kg/min | INTRAVENOUS | Status: DC
  Filled 2015-08-29: qty 250

## 2015-08-29 MED ORDER — DEXTROSE 5 % IV SOLN
750.0000 mg | INTRAVENOUS | Status: DC
  Filled 2015-08-29 (×2): qty 750

## 2015-08-29 MED ORDER — SODIUM CHLORIDE 0.9 % IV SOLN
INTRAVENOUS | Status: DC
  Filled 2015-08-29: qty 30

## 2015-08-29 MED ORDER — POTASSIUM CHLORIDE 2 MEQ/ML IV SOLN
80.0000 meq | INTRAVENOUS | Status: DC
  Filled 2015-08-29: qty 40

## 2015-08-29 MED ORDER — DEXTROSE 5 % IV SOLN
1.5000 g | INTRAVENOUS | Status: AC
  Administered 2015-08-30: .75 g via INTRAVENOUS
  Administered 2015-08-30: 1.5 g via INTRAVENOUS
  Filled 2015-08-29: qty 1.5

## 2015-08-29 MED ORDER — NITROGLYCERIN IN D5W 200-5 MCG/ML-% IV SOLN
2.0000 ug/min | INTRAVENOUS | Status: DC
Start: 2015-08-30 — End: 2015-08-30
  Filled 2015-08-29: qty 250

## 2015-08-29 MED ORDER — BISACODYL 5 MG PO TBEC: 5.0000 mg | DELAYED_RELEASE_TABLET | Freq: Once | ORAL | Status: DC

## 2015-08-29 MED ORDER — VANCOMYCIN HCL 10 G IV SOLR
1500.0000 mg | INTRAVENOUS | Status: AC
  Administered 2015-08-30: 1500 mg via INTRAVENOUS
  Filled 2015-08-29: qty 1500

## 2015-08-29 MED ORDER — SODIUM CHLORIDE 0.9 % IV SOLN
INTRAVENOUS | Status: AC
  Administered 2015-08-30: 69 mL/h via INTRAVENOUS
  Filled 2015-08-29: qty 40

## 2015-08-29 MED ORDER — METOPROLOL TARTRATE 12.5 MG HALF TABLET
12.5000 mg | ORAL_TABLET | Freq: Once | ORAL | Status: AC
  Administered 2015-08-30: 12.5 mg via ORAL
  Filled 2015-08-29: qty 1

## 2015-08-29 MED ORDER — SODIUM CHLORIDE 0.9 % IV SOLN
INTRAVENOUS | Status: AC
  Administered 2015-08-30: 1.6 [IU]/h via INTRAVENOUS
  Filled 2015-08-29: qty 2.5

## 2015-08-29 MED ORDER — TEMAZEPAM 15 MG PO CAPS: 15.0000 mg | ORAL_CAPSULE | Freq: Once | ORAL | Status: DC | PRN

## 2015-08-29 MED ORDER — PLASMA-LYTE 148 IV SOLN
INTRAVENOUS | Status: AC
  Administered 2015-08-30: 500 mL
  Filled 2015-08-29: qty 2.5

## 2015-08-29 MED ORDER — HEPARIN BOLUS VIA INFUSION
2500.0000 [IU] | Freq: Once | INTRAVENOUS | Status: DC
  Filled 2015-08-29: qty 2500

## 2015-08-29 MED ORDER — EPINEPHRINE HCL 1 MG/ML IJ SOLN
0.0000 ug/min | INTRAMUSCULAR | Status: DC
  Filled 2015-08-29: qty 4

## 2015-08-29 MED ORDER — CHLORHEXIDINE GLUCONATE 0.12 % MT SOLN
15.0000 mL | Freq: Once | OROMUCOSAL | Status: AC
  Administered 2015-08-30: 15 mL via OROMUCOSAL
  Filled 2015-08-29: qty 15

## 2015-08-29 MED ORDER — DIAZEPAM 5 MG PO TABS
5.0000 mg | ORAL_TABLET | Freq: Once | ORAL | Status: AC
  Administered 2015-08-30: 5 mg via ORAL
  Filled 2015-08-29: qty 1

## 2015-08-29 NOTE — Progress Notes (Signed)
Pre-op Cardiac Surgery  Carotid Findings:  Bilateral: No significant (1-39%) ICA stenosis. Antegrade vertebral flow.    Upper Extremity Right Left  Brachial Pressures N/A restricted arm 110  Radial Waveforms Tri Tri  Ulnar Waveforms Tri Tri  Palmar Arch (Allen's Test) Decreases >50% with radial compression, normal with ulnar compression Decreases >50% with radial compression, normal with ulnar compression   Findings:  Pedal artery waveforms within normal limits.  Farrel DemarkJill Eunice, RDMS, RVT 08/29/2015

## 2015-08-29 NOTE — Progress Notes (Signed)
ANTICOAGULATION CONSULT NOTE - Follow Up Consult  Pharmacy Consult for Heparin Indication: chest pain/ACS   Assessment: 50yo male on heparin, for CABG 5/25.  Heparin level subtherapeutic at 0.12 on 1350 units/hr.  Per d/w RN, there have been no issues with the pump and it is on the correct rate.  No s/s of bleeding.  Goal of Therapy:  Heparin level 0.3-0.7 units/ml Monitor platelets by anticoagulation protocol: Yes   Plan:  Increase heparin to 1600 units/hr (3 units/kg/hr) Repeat heparin level 6hr   Marisue HumbleKendra Dorian Duval, PharmD Clinical Pharmacist  System- [facility]

## 2015-08-29 NOTE — Progress Notes (Addendum)
ANTICOAGULATION CONSULT NOTE - Follow Up Consult  Pharmacy Consult for heparin Indication: Left main stenosis pending CABG  No Known Allergies  Patient Measurements: Height: 5\' 5"  (165.1 cm) Weight: 188 lb 0.8 oz (85.3 kg) IBW/kg (Calculated) : 61.5 Heparin Dosing Weight: 78 kg  Vital Signs: Temp: 97.5 F (36.4 C) (05/24 0700) Temp Source: Oral (05/24 0700) BP: 91/64 mmHg (05/24 0700) Pulse Rate: 55 (05/24 0700)  Labs:  Recent Labs  08/28/15 0744  08/28/15 1558 08/28/15 2000 08/29/15 0210 08/29/15 0911  HGB 15.7  --   --   --  13.2  --   HCT 44.7  --   --   --  39.5  --   PLT 362  --   --   --  328  --   APTT 29  --   --   --   --   --   LABPROT 13.6  --   --   --   --   --   INR 1.02  --   --   --   --   --   HEPARINUNFRC  --   --   --   --  <0.10* <0.10*  CREATININE 0.85  --   --   --  0.85 0.82  TROPONINI <0.03  < > 0.31* 0.18* 0.09*  --   < > = values in this interval not displayed.  Estimated Creatinine Clearance: 109.4 mL/min (by C-G formula based on Cr of 0.82).  Assessment:  50 y/o M started on IV heparin for CP. S/p cath found to have 95% left main stenosis now awaiting CABG planned for 5/25. Heparin level undetectable after rate increase. No issues with gtt per RN. CBC stable.   Goal of Therapy:  Heparin level 0.3-0.7 units/ml Monitor platelets by anticoagulation protocol: Yes   Plan:  Inc heparin gtt to 1350 units/hr 6 hr HL Daily HL, CBC Monitor for S&S of bleed  Sandi CarneNick Sathvik Tiedt, PharmD Pharmacy Resident Pager: [phone] 08/29/2015,10:39 AM

## 2015-08-29 NOTE — Progress Notes (Signed)
CARDIAC REHAB PHASE I   Pt for surgery tomorrow, significant left main disease, will hold ambulation at this time. Cardiac surgery pre-op education completed with pt, pt son, and wife at bedside. Reviewed IS, sternal precautions, activity progression, cardiac surgery booklet and cardiac surgery guidelines. RN to play cardiac surgery videos (pt in ICU-has no access to room phone). Pt and family verbalized understanding. Pt in bed, call bell within reach. Will follow post-op.  [id number] Joylene GrapesEmily C Berniece Abid, RN, BSN 08/29/2015 11:14 AM

## 2015-08-29 NOTE — Progress Notes (Signed)
Inpatient Diabetes Program Recommendations  AACE/ADA: New Consensus Statement on Inpatient Glycemic Control (2015)  Target Ranges:  Prepandial:   less than 140 mg/dL      Peak postprandial:   less than 180 mg/dL (1-2 hours)      Critically ill patients:  140 - 180 mg/dL   Review of Glycemic Control  Diabetes history: None Outpatient Diabetes medications: None Current orders for Inpatient glycemic control: Novolog moderate tidwc and hs HgbA1C - 7.9%. New diagnosis of DM. For CABG on 5/25. Will be on GlucoStabilizer after surgery Will order Living Well With DM book and diabetes videos.   Will follow and continue to educate on new diagnosis of DM. Will order OP Diabetes Education consult.  Thank you. Ailene Ardshonda Kajuana Shareef, RD, LDN, CDE Inpatient Diabetes Coordinator [phone]

## 2015-08-29 NOTE — Anesthesia Preprocedure Evaluation (Addendum)
Anesthesia Evaluation  Patient identified by MRN, date of birth, ID band Patient awake    Reviewed: Allergy & Precautions, NPO status , Patient's Chart, lab work & pertinent test results  Airway Mallampati: II  TM Distance: >3 FB Neck ROM: Full    Dental  (+) Dental Advisory Given   Pulmonary Current Smoker,    breath sounds clear to auscultation       Cardiovascular + angina + CAD and + Past MI   Rhythm:Regular Rate:Normal     Neuro/Psych negative neurological ROS     GI/Hepatic negative GI ROS, Neg liver ROS,   Endo/Other  diabetes, Type 2  Renal/GU negative Renal ROS     Musculoskeletal   Abdominal   Peds  Hematology negative hematology ROS (+)   Anesthesia Other Findings   Reproductive/Obstetrics                            Lab Results  Component Value Date   WBC 11.7* 08/29/2015   HGB 13.2 08/29/2015   HCT 39.5 08/29/2015   MCV 88.6 08/29/2015   PLT 328 08/29/2015   Lab Results  Component Value Date   CREATININE 0.82 08/29/2015   BUN 6 08/29/2015   NA 139 08/29/2015   K 3.7 08/29/2015   CL 109 08/29/2015   CO2 24 08/29/2015    Anesthesia Physical Anesthesia Plan  ASA: IV  Anesthesia Plan: General   Post-op Pain Management:    Induction: Intravenous  Airway Management Planned: Oral ETT  Additional Equipment: Arterial line, PA Cath, CVP, TEE and Ultrasound Guidance Line Placement  Intra-op Plan:   Post-operative Plan: Post-operative intubation/ventilation  Informed Consent: I have reviewed the patients History and Physical, chart, labs and discussed the procedure including the risks, benefits and alternatives for the proposed anesthesia with the patient or authorized representative who has indicated his/her understanding and acceptance.   Dental advisory given  Plan Discussed with: CRNA  Anesthesia Plan Comments:        Anesthesia Quick Evaluation

## 2015-08-29 NOTE — Progress Notes (Signed)
ANTICOAGULATION CONSULT NOTE - Follow Up Consult  Pharmacy Consult for Heparin Indication: Left main stenosis pending CABG  No Known Allergies  Patient Measurements: Height: 5\' 5"  (165.1 cm) Weight: 188 lb 0.8 oz (85.3 kg) IBW/kg (Calculated) : 61.5 Heparin Dosing Weight: 78kg  Vital Signs: Temp: 98.3 F (36.8 C) (05/24 0000) Temp Source: Oral (05/24 0000) BP: 107/65 mmHg (05/24 0200) Pulse Rate: 56 (05/24 0200)  Labs:  Recent Labs  08/28/15 0744 08/28/15 0940 08/28/15 1558 08/28/15 2000 08/29/15 0210  HGB 15.7  --   --   --  13.2  HCT 44.7  --   --   --  39.5  PLT 362  --   --   --  328  APTT 29  --   --   --   --   LABPROT 13.6  --   --   --   --   INR 1.02  --   --   --   --   HEPARINUNFRC  --   --   --   --  <0.10*  CREATININE 0.85  --   --   --   --   TROPONINI <0.03 0.17* 0.31* 0.18*  --     Estimated Creatinine Clearance: 105.6 mL/min (by C-G formula based on Cr of 0.85).  Assessment: 49yom started on IV heparin earlier today for chest pain. Now s/p cath found to have 95% left main stenosis. CVTS consulted for CABG. Heparin to be resumed 2 hours post TR band removal. TR band removed ~1745 per RN. No bleeding complications.  HL is undetectable on heparin 950 units/hr. Nurse reports no issues with infusion or bleeding.  Goal of Therapy:  Heparin level 0.3-0.7 units/ml Monitor platelets by anticoagulation protocol: Yes   Plan:  Increase heparin to 1150 units/hr Check 6 hour heparin level Daily heparin level and CBC Follow up CABG plans  Arlean Hoppingorey M. Newman PiesBall, PharmD, BCPS Clinical Pharmacist Pager [phone] 08/29/2015,3:08 AM

## 2015-08-29 NOTE — Progress Notes (Signed)
Patient Name: Justin Olson Date of Encounter: 08/29/2015  Hospital Problem List     Principal Problem:   NSTEMI (non-ST elevated myocardial infarction) [facility](HCC) Active Problems:   CAD (coronary artery disease)   Tobacco abuse   Type II diabetes mellitus (HCC)   Family history of early CAD   Dyslipidemia    Subjective   No c/p or sob overnight.  R wrist healing well.  Has h/a from IV NTG.  Inpatient Medications    . albuterol  3 mL Inhalation Q4H  . aspirin EC  81 mg Oral Daily  . atorvastatin  80 mg Oral q1800  . fluticasone furoate-vilanterol  1 puff Inhalation Daily  . insulin aspart  0-15 Units Subcutaneous TID WC  . insulin aspart  0-5 Units Subcutaneous QHS  . nicotine  14 mg Transdermal Daily  . sodium chloride flush  3 mL Intravenous Q12H    Vital Signs    Filed Vitals:   08/29/15 0500 08/29/15 0600 08/29/15 0700 08/29/15 0724  BP: 100/66 110/71 91/64   Pulse: 54 47 55   Temp:    97.5 F (36.4 C)  TempSrc:    Oral  Resp: 14 15 15    Height:      Weight:      SpO2: 92% 94% 92%     Intake/Output Summary (Last 24 hours) at 08/29/15 0756 Last data filed at 08/29/15 0700  Gross per 24 hour  Intake 1628.32 ml  Output   2420 ml  Net -791.68 ml   Filed Weights   08/28/15 0733 08/28/15 1100  Weight: 195 lb (88.451 kg) 188 lb 0.8 oz (85.3 kg)    Physical Exam    General: Pleasant, NAD. Neuro: Alert and oriented X 3. Moves all extremities spontaneously. Psych: Normal affect. HEENT:  Normal  Neck: Supple without bruits or JVD. Lungs:  Resp regular and unlabored, CTA. Heart: RRR no s3, s4, or murmurs. Abdomen: Soft, non-tender, non-distended, BS + x 4.  Extremities: No clubbing, cyanosis or edema. DP/PT/Radials 2+ and equal bilaterally.  R wrist w/o bleeding/bruit/hematoma.  Labs    CBC  Recent Labs  08/28/15 0744 08/29/15 0210  WBC 10.9* 11.7*  NEUTROABS 6.3  --   HGB 15.7 13.2  HCT 44.7 39.5  MCV 87.5 88.6  PLT 362 328   Basic Metabolic  Panel  Recent Labs  16/01/9604/23/17 0744 08/29/15 0210  NA 135 139  K 3.7 3.6  CL 106 110  CO2 22 24  GLUCOSE 243* 112*  BUN 13 7  CREATININE 0.85 0.85  CALCIUM 9.2 8.6*  MG 1.7  --    Liver Function Tests  Recent Labs  08/28/15 0744  AST 23  ALT 20  ALKPHOS 87  BILITOT 0.7  PROT 6.7  ALBUMIN 3.5   Cardiac Enzymes  Recent Labs  08/28/15 1558 08/28/15 2000 08/29/15 0210  TROPONINI 0.31* 0.18* 0.09*   Hemoglobin A1C  Recent Labs  08/28/15 1558  HGBA1C 7.9*   Fasting Lipid Panel  Recent Labs  08/29/15 0210  CHOL 189  HDL 30*  LDLCALC 124*  TRIG 175*  CHOLHDL 6.3   Thyroid Function Tests  Recent Labs  08/29/15 0210  TSH 2.224    Telemetry    Sinus brady    Radiology    Dg Chest 2 View  08/29/2015  CLINICAL DATA:  Preoperative evaluation for open heart surgery, angina pectoris, smoker EXAM: CHEST  2 VIEW COMPARISON:  08/28/2015 FINDINGS: Enlargement of cardiac silhouette. Mediastinal contours  and pulmonary vascularity normal. Bibasilar opacities favoring atelectasis though infiltrate in LEFT lower lobe not excluded. Blunting of posterior costophrenic angles by tiny pleural effusions. Remaining lungs clear. No pneumothorax. Bones unremarkable. IMPRESSION: Enlargement of cardiac silhouette. Probable bibasilar atelectasis and tiny pleural effusions though cannot completely exclude LEFT lower lobe infiltrate. Electronically Signed   By: Ulyses Southward M.D.   On: 08/29/2015 07:53   Dg Chest Port 1 View  08/28/2015  CLINICAL DATA:  Chest pain and pressure, heaviness, shortness of breath, numbness LEFT arm, smoker EXAM: PORTABLE CHEST 1 VIEW COMPARISON:  Portable exam 0734 hours without priors for comparison FINDINGS: Normal heart size, mediastinal contours and pulmonary vascularity. Lungs grossly clear. No pleural effusion or pneumothorax. Bones unremarkable. IMPRESSION: No definite acute abnormalities. Electronically Signed   By: Ulyses Southward M.D.   On: 08/28/2015  08:01  _____________   2D Echocardiogram 5.23.2017  Study Conclusions   - Left ventricle: The cavity size was normal. Wall thickness was   normal. Systolic function was normal. The estimated ejection   fraction was in the range of 50% to 55%. Wall motion was normal;   there were no regional wall motion abnormalities. - Left atrium: The atrium was mildly dilated. - Right ventricle: The cavity size was mildly dilated. Systolic   function was mildly reduced. - Right atrium: The atrium was mildly dilated.   Assessment & Plan    1.  NSTEMI/CAD:  Pt presented from [facility] with c/p and elevated trop.  Cath 5/23 revealed 95% LM dzs and otw nl cors.  Echo showed nl EF.  Seen by TCTS with plan for CABG 5/25.  Cont ASA/high potency statin.  No  blocker 2/2 baseline bradycardia.  Consider P2Y12 inhibitor post-op given presentation with ACS.  2.  Tob Abuse:  Cessation advised.  3.  Type II DM:  New dx.  A1c 7.9.  Cont SSI.  Will need diabetes education post-op.  4.  Dyslipidemia:  LDL 124.  Nl LFT's.  Cont lipitor 80.  Signed, Nicolasa Ducking NP  Agree with note by Ward Givens RNP  POD #1 Cath revealing LM Dx. Trop low. On IV hep/NTG. No CP. Labs OK. For CABG 1st case tomorrow AM with DR PVT.   Runell Gess, M.D., FACP, [facility], [facility] [address] [place], [place]  [address]  [phone] 08/29/2015 8:51 AM

## 2015-08-30 ENCOUNTER — Inpatient Hospital Stay (HOSPITAL_COMMUNITY): Payer: BLUE CROSS/BLUE SHIELD | Admitting: Anesthesiology

## 2015-08-30 ENCOUNTER — Inpatient Hospital Stay (HOSPITAL_COMMUNITY): Payer: BLUE CROSS/BLUE SHIELD

## 2015-08-30 ENCOUNTER — Encounter (HOSPITAL_COMMUNITY)
Admission: EM | Disposition: A | Discharge status: Home / Self Care | Payer: Self-pay | Source: Home / Self Care | Attending: Cardiothoracic Surgery

## 2015-08-30 DIAGNOSIS — I25110 Atherosclerotic heart disease of native coronary artery with unstable angina pectoris: Secondary | ICD-10-CM

## 2015-08-30 HISTORY — PX: TEE WITHOUT CARDIOVERSION: SHX5443

## 2015-08-30 HISTORY — PX: CORONARY ARTERY BYPASS GRAFT: SHX141

## 2015-08-30 LAB — BASIC METABOLIC PANEL
Anion gap: 7 (ref 5–15)
BUN: 7 mg/dL (ref 6–20)
CALCIUM: 9.2 mg/dL (ref 8.9–10.3)
CO2: 26 mmol/L (ref 22–32)
Chloride: 108 mmol/L (ref 101–111)
Creatinine, Ser: 0.74 mg/dL (ref 0.61–1.24)
GFR calc Af Amer: >60 mL/min (ref >60)
GLUCOSE: 146 mg/dL — AB (ref 65–99)
Potassium: 3.5 mmol/L (ref 3.5–5.1)
SODIUM: 141 mmol/L (ref 135–145)

## 2015-08-30 LAB — CBC
HCT: 41.3 % (ref 39.0–52.0)
HCT: 36.9 % — ABNORMAL LOW (ref 39.0–52.0)
HEMATOCRIT: 39.1 % (ref 39.0–52.0)
HEMOGLOBIN: 12.9 g/dL — AB (ref 13.0–17.0)
Hemoglobin: 13.8 g/dL (ref 13.0–17.0)
Hemoglobin: 12.2 g/dL — ABNORMAL LOW (ref 13.0–17.0)
MCH: 29.2 pg (ref 26.0–34.0)
MCH: 29 pg (ref 26.0–34.0)
MCH: 29.2 pg (ref 26.0–34.0)
MCHC: 33.4 g/dL (ref 30.0–36.0)
MCHC: 33 g/dL (ref 30.0–36.0)
MCHC: 33.1 g/dL (ref 30.0–36.0)
MCV: 87.3 fL (ref 78.0–100.0)
MCV: 87.9 fL (ref 78.0–100.0)
MCV: 88.3 fL (ref 78.0–100.0)
PLATELETS: 343 10*3/uL (ref 150–400)
Platelets: 199 10*3/uL (ref 150–400)
Platelets: 227 10*3/uL (ref 150–400)
RBC: 4.73 MIL/uL (ref 4.22–5.81)
RBC: 4.45 MIL/uL (ref 4.22–5.81)
RBC: 4.18 MIL/uL — ABNORMAL LOW (ref 4.22–5.81)
RDW: 13.1 % (ref 11.5–15.5)
RDW: 13.3 % (ref 11.5–15.5)
RDW: 13.6 % (ref 11.5–15.5)
WBC: 12.3 10*3/uL — AB (ref 4.0–10.5)
WBC: 21.7 10*3/uL — ABNORMAL HIGH (ref 4.0–10.5)
WBC: 21 10*3/uL — ABNORMAL HIGH (ref 4.0–10.5)

## 2015-08-30 LAB — RAPID URINE DRUG SCREEN, HOSP PERFORMED
AMPHETAMINES: NOT DETECTED
BARBITURATES: NOT DETECTED
BENZODIAZEPINES: POSITIVE — AB
Cocaine: NOT DETECTED
Opiates: POSITIVE — AB
TETRAHYDROCANNABINOL: NOT DETECTED

## 2015-08-30 LAB — POCT I-STAT, CHEM 8
BUN: 5 mg/dL — ABNORMAL LOW (ref 6–20)
BUN: 4 mg/dL — ABNORMAL LOW (ref 6–20)
BUN: 5 mg/dL — ABNORMAL LOW (ref 6–20)
BUN: 4 mg/dL — ABNORMAL LOW (ref 6–20)
BUN: 5 mg/dL — AB (ref 6–20)
BUN: 5 mg/dL — AB (ref 6–20)
CALCIUM ION: 1.22 mmol/L (ref 1.12–1.23)
CALCIUM ION: 1.22 mmol/L (ref 1.12–1.23)
CHLORIDE: 102 mmol/L (ref 101–111)
CHLORIDE: 105 mmol/L (ref 101–111)
CHLORIDE: 102 mmol/L (ref 101–111)
CHLORIDE: 102 mmol/L (ref 101–111)
CREATININE: 0.5 mg/dL — AB (ref 0.61–1.24)
CREATININE: 0.7 mg/dL (ref 0.61–1.24)
Calcium, Ion: 1.07 mmol/L — ABNORMAL LOW (ref 1.12–1.23)
Calcium, Ion: 1.26 mmol/L — ABNORMAL HIGH (ref 1.12–1.23)
Calcium, Ion: 1.07 mmol/L — ABNORMAL LOW (ref 1.12–1.23)
Calcium, Ion: 1.12 mmol/L (ref 1.12–1.23)
Chloride: 105 mmol/L (ref 101–111)
Chloride: 108 mmol/L (ref 101–111)
Creatinine, Ser: 0.5 mg/dL — ABNORMAL LOW (ref 0.61–1.24)
Creatinine, Ser: 0.6 mg/dL — ABNORMAL LOW (ref 0.61–1.24)
Creatinine, Ser: 0.5 mg/dL — ABNORMAL LOW (ref 0.61–1.24)
Creatinine, Ser: 0.7 mg/dL (ref 0.61–1.24)
GLUCOSE: 141 mg/dL — AB (ref 65–99)
GLUCOSE: 135 mg/dL — AB (ref 65–99)
GLUCOSE: 153 mg/dL — AB (ref 65–99)
GLUCOSE: 192 mg/dL — AB (ref 65–99)
Glucose, Bld: 123 mg/dL — ABNORMAL HIGH (ref 65–99)
Glucose, Bld: 135 mg/dL — ABNORMAL HIGH (ref 65–99)
HCT: 39 % (ref 39.0–52.0)
HCT: 39 % (ref 39.0–52.0)
HEMATOCRIT: 33 % — AB (ref 39.0–52.0)
HEMATOCRIT: 31 % — AB (ref 39.0–52.0)
HEMATOCRIT: 34 % — AB (ref 39.0–52.0)
HEMATOCRIT: 36 % — AB (ref 39.0–52.0)
HEMOGLOBIN: 13.3 g/dL (ref 13.0–17.0)
HEMOGLOBIN: 11.2 g/dL — AB (ref 13.0–17.0)
HEMOGLOBIN: 13.3 g/dL (ref 13.0–17.0)
HEMOGLOBIN: 11.6 g/dL — AB (ref 13.0–17.0)
HEMOGLOBIN: 12.2 g/dL — AB (ref 13.0–17.0)
Hemoglobin: 10.5 g/dL — ABNORMAL LOW (ref 13.0–17.0)
POTASSIUM: 4 mmol/L (ref 3.5–5.1)
POTASSIUM: 4 mmol/L (ref 3.5–5.1)
POTASSIUM: 4.7 mmol/L (ref 3.5–5.1)
POTASSIUM: 3.9 mmol/L (ref 3.5–5.1)
Potassium: 4.2 mmol/L (ref 3.5–5.1)
Potassium: 4.7 mmol/L (ref 3.5–5.1)
SODIUM: 142 mmol/L (ref 135–145)
SODIUM: 141 mmol/L (ref 135–145)
Sodium: 142 mmol/L (ref 135–145)
Sodium: 142 mmol/L (ref 135–145)
Sodium: 136 mmol/L (ref 135–145)
Sodium: 140 mmol/L (ref 135–145)
TCO2: 24 mmol/L (ref 0–100)
TCO2: 26 mmol/L (ref 0–100)
TCO2: 28 mmol/L (ref 0–100)
TCO2: 23 mmol/L (ref 0–100)
TCO2: 23 mmol/L (ref 0–100)
TCO2: 21 mmol/L (ref 0–100)

## 2015-08-30 LAB — GLUCOSE, CAPILLARY
GLUCOSE-CAPILLARY: 80 mg/dL (ref 65–99)
GLUCOSE-CAPILLARY: 130 mg/dL — AB (ref 65–99)
GLUCOSE-CAPILLARY: 126 mg/dL — AB (ref 65–99)
GLUCOSE-CAPILLARY: 138 mg/dL — AB (ref 65–99)
GLUCOSE-CAPILLARY: 139 mg/dL — AB (ref 65–99)
Glucose-Capillary: 95 mg/dL (ref 65–99)
Glucose-Capillary: 114 mg/dL — ABNORMAL HIGH (ref 65–99)
Glucose-Capillary: 120 mg/dL — ABNORMAL HIGH (ref 65–99)
Glucose-Capillary: 144 mg/dL — ABNORMAL HIGH (ref 65–99)

## 2015-08-30 LAB — POCT I-STAT 3, ART BLOOD GAS (G3+)
Acid-base deficit: 1 mmol/L (ref 0.0–2.0)
Acid-base deficit: 3 mmol/L — ABNORMAL HIGH (ref 0.0–2.0)
Acid-base deficit: 4 mmol/L — ABNORMAL HIGH (ref 0.0–2.0)
Acid-base deficit: 4 mmol/L — ABNORMAL HIGH (ref 0.0–2.0)
Acid-base deficit: 5 mmol/L — ABNORMAL HIGH (ref 0.0–2.0)
BICARBONATE: 26.4 meq/L — AB (ref 20.0–24.0)
BICARBONATE: 23.2 meq/L (ref 20.0–24.0)
BICARBONATE: 21.5 meq/L (ref 20.0–24.0)
BICARBONATE: 20.2 meq/L (ref 20.0–24.0)
Bicarbonate: 22.7 mEq/L (ref 20.0–24.0)
O2 SAT: 93 %
O2 SAT: 97 %
O2 Saturation: 100 %
O2 Saturation: 98 %
O2 Saturation: 89 %
PCO2 ART: 54.2 mmHg — AB (ref 35.0–45.0)
PCO2 ART: 47 mmHg — AB (ref 35.0–45.0)
PCO2 ART: 40.5 mmHg (ref 35.0–45.0)
PCO2 ART: 37.7 mmHg (ref 35.0–45.0)
PH ART: 7.296 — AB (ref 7.350–7.450)
PH ART: 7.293 — AB (ref 7.350–7.450)
PH ART: 7.291 — AB (ref 7.350–7.450)
PO2 ART: 409 mmHg — AB (ref 80.0–100.0)
PO2 ART: 112 mmHg — AB (ref 80.0–100.0)
PO2 ART: 96 mmHg (ref 80.0–100.0)
PO2 ART: 60 mmHg — AB (ref 80.0–100.0)
Patient temperature: 36.8
Patient temperature: 37.2
Patient temperature: 37.2
TCO2: 28 mmol/L (ref 0–100)
TCO2: 25 mmol/L (ref 0–100)
TCO2: 24 mmol/L (ref 0–100)
TCO2: 23 mmol/L (ref 0–100)
TCO2: 21 mmol/L (ref 0–100)
pCO2 arterial: 48 mmHg — ABNORMAL HIGH (ref 35.0–45.0)
pH, Arterial: 7.334 — ABNORMAL LOW (ref 7.350–7.450)
pH, Arterial: 7.338 — ABNORMAL LOW (ref 7.350–7.450)
pO2, Arterial: 76 mmHg — ABNORMAL LOW (ref 80.0–100.0)

## 2015-08-30 LAB — MAGNESIUM: Magnesium: 2.4 mg/dL (ref 1.7–2.4)

## 2015-08-30 LAB — HEMOGLOBIN AND HEMATOCRIT, BLOOD
HCT: 31.2 % — ABNORMAL LOW (ref 39.0–52.0)
Hemoglobin: 10.5 g/dL — ABNORMAL LOW (ref 13.0–17.0)

## 2015-08-30 LAB — PLATELET COUNT: Platelets: 254 10*3/uL (ref 150–400)

## 2015-08-30 LAB — HEPARIN LEVEL (UNFRACTIONATED): Heparin Unfractionated: 0.44 IU/mL (ref 0.30–0.70)

## 2015-08-30 LAB — HEMOGLOBIN A1C
Hgb A1c MFr Bld: 7.9 % — ABNORMAL HIGH (ref 4.8–5.6)
Mean Plasma Glucose: 180 mg/dL

## 2015-08-30 LAB — POCT I-STAT 4, (NA,K, GLUC, HGB,HCT)
Glucose, Bld: 117 mg/dL — ABNORMAL HIGH (ref 65–99)
HCT: 39 % (ref 39.0–52.0)
HEMOGLOBIN: 13.3 g/dL (ref 13.0–17.0)
POTASSIUM: 3.5 mmol/L (ref 3.5–5.1)
Sodium: 143 mmol/L (ref 135–145)

## 2015-08-30 LAB — CREATININE, SERUM
Creatinine, Ser: 0.83 mg/dL (ref 0.61–1.24)
GFR calc Af Amer: >60 mL/min (ref >60)
GFR calc non Af Amer: >60 mL/min (ref >60)

## 2015-08-30 LAB — PROTIME-INR
INR: 1.3 (ref 0.00–1.49)
Prothrombin Time: 16.3 seconds — ABNORMAL HIGH (ref 11.6–15.2)

## 2015-08-30 LAB — APTT: APTT: 30 s (ref 24–37)

## 2015-08-30 SURGERY — CORONARY ARTERY BYPASS GRAFTING (CABG)
Anesthesia: General | Site: Chest

## 2015-08-30 MED ORDER — HEPARIN SODIUM (PORCINE) 1000 UNIT/ML IJ SOLN
INTRAMUSCULAR | Status: AC
  Filled 2015-08-30: qty 1

## 2015-08-30 MED ORDER — SODIUM CHLORIDE 0.9 % IV SOLN: 250.0000 mL | INTRAVENOUS | Status: DC

## 2015-08-30 MED ORDER — PROTAMINE SULFATE 10 MG/ML IV SOLN
INTRAVENOUS | Status: DC | PRN
  Administered 2015-08-30: 10 mg via INTRAVENOUS
  Administered 2015-08-30 (×2): 100 mg via INTRAVENOUS
  Administered 2015-08-30: 90 mg via INTRAVENOUS

## 2015-08-30 MED ORDER — FENTANYL CITRATE (PF) 250 MCG/5ML IJ SOLN
INTRAMUSCULAR | Status: DC | PRN
  Administered 2015-08-30 (×2): 250 ug via INTRAVENOUS
  Administered 2015-08-30: 100 ug via INTRAVENOUS
  Administered 2015-08-30: 650 ug via INTRAVENOUS

## 2015-08-30 MED ORDER — NITROGLYCERIN IN D5W 200-5 MCG/ML-% IV SOLN: 0.0000 ug/min | INTRAVENOUS | Status: DC

## 2015-08-30 MED ORDER — DOCUSATE SODIUM 100 MG PO CAPS
200.0000 mg | ORAL_CAPSULE | Freq: Every day | ORAL | Status: DC
  Administered 2015-08-31 – 2015-09-05 (×4): 200 mg via ORAL
  Filled 2015-08-30 (×5): qty 2

## 2015-08-30 MED ORDER — BISACODYL 10 MG RE SUPP
10.0000 mg | Freq: Every day | RECTAL | Status: DC
Start: 2015-08-31 — End: 2015-09-05

## 2015-08-30 MED ORDER — FENTANYL CITRATE (PF) 250 MCG/5ML IJ SOLN
INTRAMUSCULAR | Status: AC
  Filled 2015-08-30: qty 25

## 2015-08-30 MED ORDER — HEMOSTATIC AGENTS (NO CHARGE) OPTIME
TOPICAL | Status: DC | PRN
  Administered 2015-08-30: 1 via TOPICAL

## 2015-08-30 MED ORDER — METOPROLOL TARTRATE 12.5 MG HALF TABLET
12.5000 mg | ORAL_TABLET | Freq: Two times a day (BID) | ORAL | Status: DC
  Administered 2015-09-01: 12.5 mg via ORAL
  Filled 2015-08-30: qty 1

## 2015-08-30 MED ORDER — METOPROLOL TARTRATE 5 MG/5ML IV SOLN: 2.5000 mg | INTRAVENOUS | Status: DC | PRN

## 2015-08-30 MED ORDER — DEXTROSE 5 % IV SOLN
1.5000 g | Freq: Two times a day (BID) | INTRAVENOUS | Status: AC
  Administered 2015-08-30 – 2015-09-01 (×4): 1.5 g via INTRAVENOUS
  Filled 2015-08-30 (×4): qty 1.5

## 2015-08-30 MED ORDER — ANTISEPTIC ORAL RINSE SOLUTION (CORINZ)
7.0000 mL | Freq: Four times a day (QID) | OROMUCOSAL | Status: DC
  Administered 2015-08-30 – 2015-09-01 (×5): 7 mL via OROMUCOSAL

## 2015-08-30 MED ORDER — ACETAMINOPHEN 160 MG/5ML PO SOLN: 650.0000 mg | Freq: Once | ORAL | Status: AC

## 2015-08-30 MED ORDER — HEPARIN SODIUM (PORCINE) 1000 UNIT/ML IJ SOLN
INTRAMUSCULAR | Status: DC | PRN
  Administered 2015-08-30: 3000 [IU] via INTRAVENOUS
  Administered 2015-08-30: 32000 [IU] via INTRAVENOUS
  Administered 2015-08-30: 2000 [IU] via INTRAVENOUS

## 2015-08-30 MED ORDER — POTASSIUM CHLORIDE 10 MEQ/50ML IV SOLN
10.0000 meq | INTRAVENOUS | Status: AC
  Administered 2015-08-30 (×3): 10 meq via INTRAVENOUS

## 2015-08-30 MED ORDER — PROPOFOL 10 MG/ML IV BOLUS
INTRAVENOUS | Status: AC
  Filled 2015-08-30: qty 20

## 2015-08-30 MED ORDER — POTASSIUM CHLORIDE 10 MEQ/50ML IV SOLN
10.0000 meq | Freq: Once | INTRAVENOUS | Status: AC
  Administered 2015-08-30: 10 meq via INTRAVENOUS

## 2015-08-30 MED ORDER — SODIUM CHLORIDE 0.9 % IV SOLN
INTRAVENOUS | Status: DC
  Administered 2015-08-30: 20:00:00 via INTRAVENOUS
  Filled 2015-08-30 (×2): qty 2.5

## 2015-08-30 MED ORDER — ROCURONIUM BROMIDE 50 MG/5ML IV SOLN
INTRAVENOUS | Status: AC
  Filled 2015-08-30: qty 1

## 2015-08-30 MED ORDER — MAGNESIUM SULFATE 4 GM/100ML IV SOLN
4.0000 g | Freq: Once | INTRAVENOUS | Status: AC
  Administered 2015-08-30: 4 g via INTRAVENOUS
  Filled 2015-08-30: qty 100

## 2015-08-30 MED ORDER — ONDANSETRON HCL 4 MG/2ML IJ SOLN: 4.0000 mg | Freq: Four times a day (QID) | INTRAMUSCULAR | Status: DC | PRN

## 2015-08-30 MED ORDER — BUDESONIDE 0.25 MG/2ML IN SUSP
0.2500 mg | Freq: Two times a day (BID) | RESPIRATORY_TRACT | Status: DC
  Administered 2015-08-30 – 2015-09-05 (×12): 0.25 mg via RESPIRATORY_TRACT
  Filled 2015-08-30 (×12): qty 2

## 2015-08-30 MED ORDER — SODIUM CHLORIDE 0.45 % IV SOLN
INTRAVENOUS | Status: DC | PRN
  Administered 2015-08-31: 19:00:00 via INTRAVENOUS

## 2015-08-30 MED ORDER — PROPOFOL 10 MG/ML IV BOLUS
INTRAVENOUS | Status: DC | PRN
  Administered 2015-08-30: 50 mg via INTRAVENOUS

## 2015-08-30 MED ORDER — CHLORHEXIDINE GLUCONATE 0.12% ORAL RINSE (MEDLINE KIT)
15.0000 mL | Freq: Two times a day (BID) | OROMUCOSAL | Status: DC
  Administered 2015-08-30 (×2): 15 mL via OROMUCOSAL

## 2015-08-30 MED ORDER — ROCURONIUM BROMIDE 100 MG/10ML IV SOLN
INTRAVENOUS | Status: DC | PRN
  Administered 2015-08-30 (×4): 50 mg via INTRAVENOUS

## 2015-08-30 MED ORDER — MIDAZOLAM HCL 2 MG/2ML IJ SOLN: 2.0000 mg | INTRAMUSCULAR | Status: DC | PRN

## 2015-08-30 MED ORDER — LIDOCAINE 2% (20 MG/ML) 5 ML SYRINGE
INTRAMUSCULAR | Status: AC
  Filled 2015-08-30: qty 5

## 2015-08-30 MED ORDER — ESMOLOL HCL 100 MG/10ML IV SOLN
INTRAVENOUS | Status: AC
  Filled 2015-08-30: qty 10

## 2015-08-30 MED ORDER — ALBUMIN HUMAN 5 % IV SOLN
INTRAVENOUS | Status: DC | PRN
Start: 2015-08-30 — End: 2015-08-30
  Administered 2015-08-30: 12:00:00 via INTRAVENOUS

## 2015-08-30 MED ORDER — PHENYLEPHRINE HCL 10 MG/ML IJ SOLN
0.0000 ug/min | INTRAVENOUS | Status: DC
  Filled 2015-08-30: qty 2

## 2015-08-30 MED ORDER — SODIUM CHLORIDE 0.9 % IJ SOLN
OROMUCOSAL | Status: DC | PRN
  Administered 2015-08-30: 09:00:00 via TOPICAL

## 2015-08-30 MED ORDER — SUCCINYLCHOLINE CHLORIDE 200 MG/10ML IV SOSY
PREFILLED_SYRINGE | INTRAVENOUS | Status: AC
  Filled 2015-08-30: qty 10

## 2015-08-30 MED ORDER — LEVALBUTEROL HCL 1.25 MG/0.5ML IN NEBU
1.2500 mg | INHALATION_SOLUTION | Freq: Four times a day (QID) | RESPIRATORY_TRACT | Status: DC
  Administered 2015-08-30 – 2015-08-31 (×4): 1.25 mg via RESPIRATORY_TRACT
  Filled 2015-08-30 (×4): qty 0.5

## 2015-08-30 MED ORDER — MORPHINE SULFATE (PF) 2 MG/ML IV SOLN
1.0000 mg | INTRAVENOUS | Status: AC | PRN
  Administered 2015-08-30 (×3): 2 mg via INTRAVENOUS
  Filled 2015-08-30 (×3): qty 1

## 2015-08-30 MED ORDER — SODIUM CHLORIDE 0.9% FLUSH
3.0000 mL | Freq: Two times a day (BID) | INTRAVENOUS | Status: DC
  Administered 2015-08-31: 10:00:00 via INTRAVENOUS
  Administered 2015-09-01: 10 mL via INTRAVENOUS
  Administered 2015-09-01 – 2015-09-02 (×2): 3 mL via INTRAVENOUS

## 2015-08-30 MED ORDER — ASPIRIN EC 325 MG PO TBEC
325.0000 mg | DELAYED_RELEASE_TABLET | Freq: Every day | ORAL | Status: DC
  Administered 2015-08-31 – 2015-09-04 (×5): 325 mg via ORAL
  Filled 2015-08-30 (×6): qty 1

## 2015-08-30 MED ORDER — FAMOTIDINE IN NACL 20-0.9 MG/50ML-% IV SOLN
20.0000 mg | Freq: Two times a day (BID) | INTRAVENOUS | Status: AC
  Administered 2015-08-30: 20 mg via INTRAVENOUS

## 2015-08-30 MED ORDER — ACETAMINOPHEN 500 MG PO TABS
1000.0000 mg | ORAL_TABLET | Freq: Four times a day (QID) | ORAL | Status: AC
  Administered 2015-08-30 – 2015-09-04 (×20): 1000 mg via ORAL
  Filled 2015-08-30 (×19): qty 2

## 2015-08-30 MED ORDER — GLYCOPYRROLATE 0.2 MG/ML IV SOSY
PREFILLED_SYRINGE | INTRAVENOUS | Status: AC
  Filled 2015-08-30: qty 3

## 2015-08-30 MED ORDER — MIDAZOLAM HCL 10 MG/2ML IJ SOLN
INTRAMUSCULAR | Status: AC
Start: 2015-08-30 — End: 2015-08-30
  Filled 2015-08-30: qty 2

## 2015-08-30 MED ORDER — DOPAMINE-DEXTROSE 3.2-5 MG/ML-% IV SOLN: 0.0000 ug/kg/min | INTRAVENOUS | Status: DC

## 2015-08-30 MED ORDER — SODIUM CHLORIDE 0.9 % IV SOLN
INTRAVENOUS | Status: DC
  Administered 2015-08-30: 20 mL/h via INTRAVENOUS

## 2015-08-30 MED ORDER — MORPHINE SULFATE (PF) 2 MG/ML IV SOLN: 2.0000 mg | INTRAVENOUS | Status: DC | PRN

## 2015-08-30 MED ORDER — VANCOMYCIN HCL IN DEXTROSE 1-5 GM/200ML-% IV SOLN
1000.0000 mg | Freq: Once | INTRAVENOUS | Status: DC
  Filled 2015-08-30: qty 200

## 2015-08-30 MED ORDER — MORPHINE SULFATE (PF) 2 MG/ML IV SOLN
2.0000 mg | INTRAVENOUS | Status: DC | PRN
  Administered 2015-08-30 – 2015-08-31 (×5): 2 mg via INTRAVENOUS
  Administered 2015-09-01: 4 mg via INTRAVENOUS
  Administered 2015-09-01 (×3): 2 mg via INTRAVENOUS
  Filled 2015-08-30: qty 1
  Filled 2015-08-30: qty 2
  Filled 2015-08-30 (×2): qty 1
  Filled 2015-08-30: qty 2
  Filled 2015-08-30 (×7): qty 1

## 2015-08-30 MED ORDER — ALBUMIN HUMAN 5 % IV SOLN
250.0000 mL | INTRAVENOUS | Status: AC | PRN
Start: 2015-08-30 — End: 2015-08-31
  Administered 2015-08-30 (×2): 250 mL via INTRAVENOUS

## 2015-08-30 MED ORDER — LACTATED RINGERS IV SOLN: INTRAVENOUS | Status: DC

## 2015-08-30 MED ORDER — DEXTROSE 5 % IV SOLN
10.0000 mg | INTRAVENOUS | Status: DC | PRN
  Administered 2015-08-30: 25 ug/min via INTRAVENOUS

## 2015-08-30 MED ORDER — PANTOPRAZOLE SODIUM 40 MG PO TBEC
40.0000 mg | DELAYED_RELEASE_TABLET | Freq: Every day | ORAL | Status: DC
  Administered 2015-09-01 – 2015-09-05 (×5): 40 mg via ORAL
  Filled 2015-08-30 (×5): qty 1

## 2015-08-30 MED ORDER — BISACODYL 5 MG PO TBEC
10.0000 mg | DELAYED_RELEASE_TABLET | Freq: Every day | ORAL | Status: DC
  Administered 2015-08-31 – 2015-09-05 (×2): 10 mg via ORAL
  Filled 2015-08-30 (×3): qty 2

## 2015-08-30 MED ORDER — VANCOMYCIN HCL IN DEXTROSE 1-5 GM/200ML-% IV SOLN
1000.0000 mg | Freq: Two times a day (BID) | INTRAVENOUS | Status: AC
  Administered 2015-08-30 – 2015-08-31 (×3): 1000 mg via INTRAVENOUS
  Filled 2015-08-30 (×3): qty 200

## 2015-08-30 MED ORDER — CHLORHEXIDINE GLUCONATE 0.12 % MT SOLN
15.0000 mL | OROMUCOSAL | Status: AC
  Administered 2015-08-30: 15 mL via OROMUCOSAL

## 2015-08-30 MED ORDER — ACETAMINOPHEN 160 MG/5ML PO SOLN: 1000.0000 mg | Freq: Four times a day (QID) | ORAL | Status: AC

## 2015-08-30 MED ORDER — INSULIN REGULAR BOLUS VIA INFUSION
0.0000 [IU] | Freq: Three times a day (TID) | INTRAVENOUS | Status: DC
  Filled 2015-08-30: qty 10

## 2015-08-30 MED ORDER — LACTATED RINGERS IV SOLN: 500.0000 mL | Freq: Once | INTRAVENOUS | Status: DC | PRN

## 2015-08-30 MED ORDER — GLYCOPYRROLATE 0.2 MG/ML IJ SOLN
INTRAMUSCULAR | Status: DC | PRN
  Administered 2015-08-30: 0.2 mg via INTRAVENOUS

## 2015-08-30 MED ORDER — ASPIRIN 81 MG PO CHEW
324.0000 mg | CHEWABLE_TABLET | Freq: Every day | ORAL | Status: DC
Start: 2015-08-31 — End: 2015-09-05
  Administered 2015-09-05: 324 mg
  Filled 2015-08-30: qty 4

## 2015-08-30 MED ORDER — LACTATED RINGERS IV SOLN
INTRAVENOUS | Status: DC | PRN
  Administered 2015-08-30 (×5): via INTRAVENOUS

## 2015-08-30 MED ORDER — TRAMADOL HCL 50 MG PO TABS
50.0000 mg | ORAL_TABLET | ORAL | Status: DC | PRN
  Administered 2015-09-01 – 2015-09-04 (×9): 100 mg via ORAL
  Filled 2015-08-30 (×9): qty 2

## 2015-08-30 MED ORDER — MIDAZOLAM HCL 5 MG/5ML IJ SOLN
INTRAMUSCULAR | Status: DC | PRN
  Administered 2015-08-30 (×5): 2 mg via INTRAVENOUS

## 2015-08-30 MED ORDER — SUCCINYLCHOLINE CHLORIDE 20 MG/ML IJ SOLN
INTRAMUSCULAR | Status: DC | PRN
  Administered 2015-08-30: 100 mg via INTRAVENOUS

## 2015-08-30 MED ORDER — SODIUM CHLORIDE 0.9% FLUSH
3.0000 mL | INTRAVENOUS | Status: DC | PRN
  Administered 2015-08-31 (×2): 3 mL via INTRAVENOUS
  Filled 2015-08-30 (×2): qty 3

## 2015-08-30 MED ORDER — DEXMEDETOMIDINE HCL IN NACL 200 MCG/50ML IV SOLN
0.0000 ug/kg/h | INTRAVENOUS | Status: DC
  Filled 2015-08-30 (×2): qty 50

## 2015-08-30 MED ORDER — OXYCODONE HCL 5 MG PO TABS
5.0000 mg | ORAL_TABLET | ORAL | Status: DC | PRN
  Administered 2015-08-31: 5 mg via ORAL
  Administered 2015-08-31: 10 mg via ORAL
  Administered 2015-08-31 (×2): 5 mg via ORAL
  Administered 2015-08-31 – 2015-09-01 (×3): 10 mg via ORAL
  Administered 2015-09-01 (×2): 5 mg via ORAL
  Administered 2015-09-01 – 2015-09-02 (×7): 10 mg via ORAL
  Administered 2015-09-03: 5 mg via ORAL
  Administered 2015-09-03 (×2): 10 mg via ORAL
  Administered 2015-09-03: 5 mg via ORAL
  Administered 2015-09-03 – 2015-09-05 (×6): 10 mg via ORAL
  Filled 2015-08-30 (×6): qty 2
  Filled 2015-08-30: qty 1
  Filled 2015-08-30: qty 2
  Filled 2015-08-30: qty 1
  Filled 2015-08-30 (×2): qty 2
  Filled 2015-08-30: qty 1
  Filled 2015-08-30 (×5): qty 2
  Filled 2015-08-30 (×2): qty 1
  Filled 2015-08-30: qty 2
  Filled 2015-08-30 (×2): qty 1
  Filled 2015-08-30: qty 2
  Filled 2015-08-30: qty 1
  Filled 2015-08-30 (×2): qty 2
  Filled 2015-08-30: qty 1

## 2015-08-30 MED ORDER — EPHEDRINE SULFATE 50 MG/ML IJ SOLN
INTRAMUSCULAR | Status: DC | PRN
  Administered 2015-08-30: 10 mg via INTRAVENOUS

## 2015-08-30 MED ORDER — 0.9 % SODIUM CHLORIDE (POUR BTL) OPTIME
TOPICAL | Status: DC | PRN
  Administered 2015-08-30: 6000 mL
  Administered 2015-08-30: 1000 mL

## 2015-08-30 MED ORDER — HEPARIN SODIUM (PORCINE) 1000 UNIT/ML IJ SOLN
INTRAMUSCULAR | Status: AC
  Filled 2015-08-30: qty 3

## 2015-08-30 MED ORDER — METOPROLOL TARTRATE 25 MG/10 ML ORAL SUSPENSION: 12.5000 mg | Freq: Two times a day (BID) | ORAL | Status: DC

## 2015-08-30 MED ORDER — ACETAMINOPHEN 650 MG RE SUPP
650.0000 mg | Freq: Once | RECTAL | Status: AC
  Administered 2015-08-30: 650 mg via RECTAL

## 2015-08-30 MED FILL — Heparin Sodium (Porcine) Inj 1000 Unit/ML: INTRAMUSCULAR | Qty: 30 | Status: AC

## 2015-08-30 MED FILL — Sodium Bicarbonate IV Soln 8.4%: INTRAVENOUS | Qty: 50 | Status: AC

## 2015-08-30 MED FILL — Potassium Chloride Inj 2 mEq/ML: INTRAVENOUS | Qty: 40 | Status: AC

## 2015-08-30 MED FILL — Lidocaine HCl IV Inj 20 MG/ML: INTRAVENOUS | Qty: 5 | Status: AC

## 2015-08-30 MED FILL — Magnesium Sulfate Inj 50%: INTRAMUSCULAR | Qty: 10 | Status: AC

## 2015-08-30 MED FILL — Electrolyte-R (PH 7.4) Solution: INTRAVENOUS | Qty: 4000 | Status: AC

## 2015-08-30 MED FILL — Mannitol IV Soln 20%: INTRAVENOUS | Qty: 500 | Status: AC

## 2015-08-30 MED FILL — Heparin Sodium (Porcine) Inj 1000 Unit/ML: INTRAMUSCULAR | Qty: 20 | Status: AC

## 2015-08-30 MED FILL — Sodium Chloride IV Soln 0.9%: INTRAVENOUS | Qty: 2000 | Status: AC

## 2015-08-30 SURGICAL SUPPLY — 104 items
ADAPTER CARDIO PERF ANTE/RETRO (ADAPTER) ×4 IMPLANT
ADH SKN CLS APL DERMABOND .7 (GAUZE/BANDAGES/DRESSINGS) ×2
ADPR PRFSN 84XANTGRD RTRGD (ADAPTER) ×2
APL SKNCLS STERI-STRIP NONHPOA (GAUZE/BANDAGES/DRESSINGS) ×2
BAG DECANTER FOR FLEXI CONT (MISCELLANEOUS) ×4 IMPLANT
BANDAGE ELASTIC 4 VELCRO ST LF (GAUZE/BANDAGES/DRESSINGS) ×4 IMPLANT
BANDAGE ELASTIC 6 VELCRO ST LF (GAUZE/BANDAGES/DRESSINGS) ×4 IMPLANT
BASKET HEART  (ORDER IN 25'S) (MISCELLANEOUS) ×1
BASKET HEART (ORDER IN 25'S) (MISCELLANEOUS) ×1
BASKET HEART (ORDER IN 25S) (MISCELLANEOUS) ×2 IMPLANT
BENZOIN TINCTURE PRP APPL 2/3 (GAUZE/BANDAGES/DRESSINGS) ×2 IMPLANT
BLADE STERNUM SYSTEM 6 (BLADE) ×4 IMPLANT
BLADE SURG 11 STRL SS (BLADE) ×2 IMPLANT
BLADE SURG 12 STRL SS (BLADE) ×4 IMPLANT
BLADE SURG ROTATE 9660 (MISCELLANEOUS) ×2 IMPLANT
BNDG GAUZE ELAST 4 BULKY (GAUZE/BANDAGES/DRESSINGS) ×4 IMPLANT
CANISTER SUCTION 2500CC (MISCELLANEOUS) ×4 IMPLANT
CANNULA GUNDRY RCSP 15FR (MISCELLANEOUS) ×4 IMPLANT
CATH CPB KIT VANTRIGT (MISCELLANEOUS) ×4 IMPLANT
CATH ROBINSON RED A/P 18FR (CATHETERS) ×12 IMPLANT
CATH THORACIC 36FR RT ANG (CATHETERS) ×4 IMPLANT
CLIP TI WIDE RED SMALL 24 (CLIP) ×4 IMPLANT
COVER SURGICAL LIGHT HANDLE (MISCELLANEOUS) ×2 IMPLANT
CRADLE DONUT ADULT HEAD (MISCELLANEOUS) ×4 IMPLANT
DERMABOND ADVANCED (GAUZE/BANDAGES/DRESSINGS) ×2
DERMABOND ADVANCED .7 DNX12 (GAUZE/BANDAGES/DRESSINGS) IMPLANT
DRAIN CHANNEL 32F RND 10.7 FF (WOUND CARE) ×4 IMPLANT
DRAPE CARDIOVASCULAR INCISE (DRAPES) ×4
DRAPE SLUSH/WARMER DISC (DRAPES) ×4 IMPLANT
DRAPE SRG 135X102X78XABS (DRAPES) ×2 IMPLANT
DRSG AQUACEL AG ADV 3.5X14 (GAUZE/BANDAGES/DRESSINGS) ×4 IMPLANT
ELECT BLADE 4.0 EZ CLEAN MEGAD (MISCELLANEOUS) ×4
ELECT BLADE 6.5 EXT (BLADE) ×4 IMPLANT
ELECT CAUTERY BLADE 6.4 (BLADE) ×4 IMPLANT
ELECT REM PT RETURN 9FT ADLT (ELECTROSURGICAL) ×8
ELECTRODE BLDE 4.0 EZ CLN MEGD (MISCELLANEOUS) ×2 IMPLANT
ELECTRODE REM PT RTRN 9FT ADLT (ELECTROSURGICAL) ×4 IMPLANT
FELT TEFLON 1X6 (MISCELLANEOUS) ×4 IMPLANT
GAUZE SPONGE 4X4 12PLY STRL (GAUZE/BANDAGES/DRESSINGS) ×8 IMPLANT
GLOVE BIO SURGEON STRL SZ 6 (GLOVE) ×4 IMPLANT
GLOVE BIO SURGEON STRL SZ 6.5 (GLOVE) ×2 IMPLANT
GLOVE BIO SURGEON STRL SZ7.5 (GLOVE) ×12 IMPLANT
GLOVE BIO SURGEONS STRL SZ 6.5 (GLOVE) ×2
GLOVE BIOGEL PI IND STRL 6 (GLOVE) IMPLANT
GLOVE BIOGEL PI IND STRL 7.5 (GLOVE) IMPLANT
GLOVE BIOGEL PI INDICATOR 6 (GLOVE) ×6
GLOVE BIOGEL PI INDICATOR 7.5 (GLOVE) ×6
GOWN STRL REUS W/ TWL LRG LVL3 (GOWN DISPOSABLE) ×8 IMPLANT
GOWN STRL REUS W/TWL LRG LVL3 (GOWN DISPOSABLE) ×32
HEMOSTAT POWDER SURGIFOAM 1G (HEMOSTASIS) ×12 IMPLANT
HEMOSTAT SURGICEL 2X14 (HEMOSTASIS) ×4 IMPLANT
INSERT FOGARTY XLG (MISCELLANEOUS) ×2 IMPLANT
KIT BASIN OR (CUSTOM PROCEDURE TRAY) ×4 IMPLANT
KIT ROOM TURNOVER OR (KITS) ×4 IMPLANT
KIT SUCTION CATH 14FR (SUCTIONS) ×6 IMPLANT
KIT VASOVIEW 6 PRO VH 2400 (KITS) ×4 IMPLANT
LEAD PACING MYOCARDI (MISCELLANEOUS) ×4 IMPLANT
MARKER GRAFT CORONARY BYPASS (MISCELLANEOUS) ×12 IMPLANT
NS IRRIG 1000ML POUR BTL (IV SOLUTION) ×22 IMPLANT
PACK OPEN HEART (CUSTOM PROCEDURE TRAY) ×4 IMPLANT
PAD ARMBOARD 7.5X6 YLW CONV (MISCELLANEOUS) ×8 IMPLANT
PAD ELECT DEFIB RADIOL ZOLL (MISCELLANEOUS) ×4 IMPLANT
PENCIL BUTTON HOLSTER BLD 10FT (ELECTRODE) ×4 IMPLANT
PUNCH AORTIC ROTATE  4.5MM 8IN (MISCELLANEOUS) ×2 IMPLANT
PUNCH AORTIC ROTATE 4.0MM (MISCELLANEOUS) IMPLANT
PUNCH AORTIC ROTATE 4.5MM 8IN (MISCELLANEOUS) IMPLANT
PUNCH AORTIC ROTATE 5MM 8IN (MISCELLANEOUS) IMPLANT
SET CARDIOPLEGIA MPS 5001102 (MISCELLANEOUS) ×2 IMPLANT
SURGIFLO W/THROMBIN 8M KIT (HEMOSTASIS) ×4 IMPLANT
SUT BONE WAX W31G (SUTURE) ×4 IMPLANT
SUT MNCRL AB 4-0 PS2 18 (SUTURE) ×2 IMPLANT
SUT PROLENE 3 0 SH DA (SUTURE) ×4 IMPLANT
SUT PROLENE 3 0 SH1 36 (SUTURE) IMPLANT
SUT PROLENE 4 0 RB 1 (SUTURE) ×4
SUT PROLENE 4 0 SH DA (SUTURE) ×4 IMPLANT
SUT PROLENE 4-0 RB1 .5 CRCL 36 (SUTURE) ×2 IMPLANT
SUT PROLENE 5 0 C 1 36 (SUTURE) IMPLANT
SUT PROLENE 6 0 C 1 30 (SUTURE) ×2 IMPLANT
SUT PROLENE 6 0 CC (SUTURE) ×12 IMPLANT
SUT PROLENE 8 0 BV175 6 (SUTURE) ×4 IMPLANT
SUT PROLENE BLUE 7 0 (SUTURE) ×4 IMPLANT
SUT PROLENE POLY MONO (SUTURE) ×2 IMPLANT
SUT SILK  1 MH (SUTURE)
SUT SILK 1 MH (SUTURE) IMPLANT
SUT SILK 2 0 SH CR/8 (SUTURE) IMPLANT
SUT SILK 3 0 SH CR/8 (SUTURE) ×2 IMPLANT
SUT STEEL 6MS V (SUTURE) ×10 IMPLANT
SUT STEEL SZ 6 DBL 3X14 BALL (SUTURE) ×4 IMPLANT
SUT VIC AB 1 CTX 36 (SUTURE) ×8
SUT VIC AB 1 CTX36XBRD ANBCTR (SUTURE) ×4 IMPLANT
SUT VIC AB 2-0 CT1 27 (SUTURE) ×4
SUT VIC AB 2-0 CT1 TAPERPNT 27 (SUTURE) IMPLANT
SUT VIC AB 2-0 CTX 27 (SUTURE) IMPLANT
SUT VIC AB 3-0 X1 27 (SUTURE) IMPLANT
SUTURE E-PAK OPEN HEART (SUTURE) ×4 IMPLANT
SYSTEM SAHARA CHEST DRAIN ATS (WOUND CARE) ×4 IMPLANT
TAPE CLOTH SURG 4X10 WHT LF (GAUZE/BANDAGES/DRESSINGS) ×2 IMPLANT
TAPE PAPER 2X10 WHT MICROPORE (GAUZE/BANDAGES/DRESSINGS) ×2 IMPLANT
TOWEL OR 17X24 6PK STRL BLUE (TOWEL DISPOSABLE) ×8 IMPLANT
TOWEL OR 17X26 10 PK STRL BLUE (TOWEL DISPOSABLE) ×8 IMPLANT
TRAY FOLEY IC TEMP SENS 16FR (CATHETERS) ×4 IMPLANT
TUBING INSUFFLATION (TUBING) ×4 IMPLANT
UNDERPAD 30X30 INCONTINENT (UNDERPADS AND DIAPERS) ×4 IMPLANT
WATER STERILE IRR 1000ML POUR (IV SOLUTION) ×8 IMPLANT

## 2015-08-30 NOTE — Procedures (Signed)
Extubation Procedure Note  Patient Details:   Name: Justin HongRandy L Proia DOB: September 19, 1965 MRN: [id number]   Airway Documentation:     Evaluation  O2 sats: stable throughout Complications: No apparent complications Patient did tolerate procedure well. Bilateral Breath Sounds: Diminished   Yes   Patient weaned and extubated per SICU protocol. NIF -20 VC 0.95L. Patient able to vocalize and clear secretions. RT to monitor as needed  Lurlean LeydenDick, Lavetta Geier Bailey 08/30/2015, 5:32 PM

## 2015-08-30 NOTE — Brief Op Note (Signed)
08/28/2015 - 08/30/2015  11:31 AM  PATIENT:  Justin Olson  50 y.o. male  PRE-OPERATIVE DIAGNOSIS:  CAD (left main stenosis included)  POST-OPERATIVE DIAGNOSIS:  CAD (left main stenosis included)   PROCEDURE: TRANSESOPHAGEAL ECHOCARDIOGRAM (TEE), MEDIAN STERNOTOMY for CORONARY ARTERY BYPASS GRAFTING (CABG) x 2 (LIMA to LAD, SVG to RAMUS INTERMEDIATE) using GREATER SAPHENOUS VEIN from RIGHT THIGH and LEFT INTERNAL MAMMARY ARTERY   SURGEON:  Surgeon(s) and Role:    * Kerin PernaPeter Van Trigt, MD - Primary  PHYSICIAN ASSISTANT: Doree Fudgeonielle Zimmerman PA-C  ANESTHESIA:   general  EBL:  Total I/O In: 1000 [I.V.:1000] Out: 695 [Urine:695]  DRAINS: Chest tubes placed in the mediastinal and pleural spaces   COUNTS:  YES  DICTATION: .Dragon Dictation  PLAN OF CARE: Admit to inpatient   PATIENT DISPOSITION:  ICU - intubated and hemodynamically stable.   Delay start of Pharmacological VTE agent (>24hrs) due to surgical blood loss or risk of bleeding: yes  BASELINE WEIGHT: 85 kg

## 2015-08-30 NOTE — Anesthesia Procedure Notes (Addendum)
Procedure Name: Intubation Date/Time: 08/30/2015 7:48 AM Performed by: Marena ChancyBECKNER, ZACHARY S Pre-anesthesia Checklist: Patient identified, Emergency Drugs available, Suction available and Patient being monitored Patient Re-evaluated:Patient Re-evaluated prior to inductionOxygen Delivery Method: Circle System Utilized Preoxygenation: Pre-oxygenation with 100% oxygen Intubation Type: IV induction Ventilation: Oral airway inserted - appropriate to patient size and Mask ventilation without difficulty Laryngoscope Size: Glidescope Grade View: Grade I Tube type: Oral Tube size: 8.0 mm Number of attempts: 1 Airway Equipment and Method: Stylet and Oral airway Placement Confirmation: ETT inserted through vocal cords under direct vision,  positive ETCO2 and breath sounds checked- equal and bilateral Secured at: 23 cm Tube secured with: Tape Dental Injury: Teeth and Oropharynx as per pre-operative assessment    Central Venous Catheter Insertion Performed by: anesthesiologist Patient location: Pre-op. Preanesthetic checklist: patient identified, IV checked, site marked, risks and benefits discussed, surgical consent, monitors and equipment checked, pre-op evaluation, timeout performed and anesthesia consent Position: Trendelenburg Lidocaine 1% used for infiltration Landmarks identified and Seldinger technique used Catheter size: 8.5 Fr Central line and PA cath was placed.Sheath introducer Swan type and PA catheter depth:thermodilation and 47PA Cath depth:47 Procedure performed using ultrasound guided technique. Attempts: 1 Following insertion, line sutured and dressing applied. Post procedure assessment: blood return through all ports, free fluid flow and no air. Patient tolerated the procedure well with no immediate complications.

## 2015-08-30 NOTE — Progress Notes (Signed)
  Echocardiogram 2D Echocardiogram has been performed.  Leta JunglingCooper, Fay Swider M 08/30/2015, 8:16 AM

## 2015-08-30 NOTE — Progress Notes (Signed)
ANTICOAGULATION CONSULT NOTE - Follow Up Consult  Pharmacy Consult for Heparin Indication: chest pain/ACS  Assessment: 50yo male on heparin, for CABG 5/25.  Heparin level subtherapeutic at 0.12 on 1350 units/hr.  Per d/w RN, there have been no issues with the pump and it is on the correct rate.  No s/s of bleeding.  Repeat HL is now therapeutic at 0.44 on heparin 1600 units/hr. No issues with infusion or bleeding noted.  Goal of Therapy:  Heparin level 0.3-0.7 units/ml Monitor platelets by anticoagulation protocol: Yes   Plan:  Continue heparin 1600 units/hr Daily HL/CBC Monitor s/sx of bleeding   Arlean Hoppingorey M. Newman PiesBall, PharmD, BCPS Clinical Pharmacist Pager [phone]

## 2015-08-30 NOTE — Anesthesia Postprocedure Evaluation (Signed)
Anesthesia Post Note  Patient: Justin MillersRandy L Dittmar  Procedure(s) Performed: Procedure(s) (LRB): CORONARY ARTERY BYPASS GRAFTING (CABG) x2 using left internal mammary artery and right greater saphenous vein.  (N/A) TRANSESOPHAGEAL ECHOCARDIOGRAM (TEE) (N/A)  Patient location during evaluation: SICU Anesthesia Type: General Level of consciousness: sedated Pain management: pain level controlled Vital Signs Assessment: post-procedure vital signs reviewed and stable Respiratory status: patient remains intubated per anesthesia plan Cardiovascular status: stable Anesthetic complications: no    Last Vitals:  Filed Vitals:   08/30/15 0600 08/30/15 1319  BP: 136/77 118/61  Pulse: 72   Temp:    Resp: 18     Last Pain:  Filed Vitals:   08/30/15 1319  PainSc: 2                  Kennieth RadFitzgerald, Mylee Falin E

## 2015-08-30 NOTE — Progress Notes (Signed)
The patient was examined and preop studies reviewed. There has been no change from the prior exam and the patient is ready for surgery.  plan   CABG on R Mcchesney

## 2015-08-30 NOTE — OR Nursing (Signed)
1st call to SICU to charge nurse @1203 

## 2015-08-30 NOTE — Transfer of Care (Signed)
Immediate Anesthesia Transfer of Care Note  Patient: Justin Olson  Procedure(s) Performed: Procedure(s): CORONARY ARTERY BYPASS GRAFTING (CABG) x2 using left internal mammary artery and right greater saphenous vein.  (N/A) TRANSESOPHAGEAL ECHOCARDIOGRAM (TEE) (N/A)  Patient Location: SICU  Anesthesia Type:General  Level of Consciousness: sedated and unresponsive  Airway & Oxygen Therapy: Patient remains intubated per anesthesia plan and Patient placed on Ventilator (see vital sign flow sheet for setting)  Post-op Assessment: Report given to RN and Post -op Vital signs reviewed and stable  Post vital signs: Reviewed and stable  Last Vitals:  Filed Vitals:   08/30/15 0600 08/30/15 1319  BP: 136/77 118/61  Pulse: 72   Temp:    Resp: 18     Last Pain:  Filed Vitals:   08/30/15 1319  PainSc: 2       Patients Stated Pain Goal: 3 (08/29/15 0600)  Complications: No apparent anesthesia complications

## 2015-08-30 NOTE — Progress Notes (Signed)
Patient ID: Irean HongRandy L Olson, male   DOB: [birth year], 50 y.o.   MRN: [id number]   SICU Evening Rounds:   Hemodynamically stable on low dose neo CI = 2.1  Extubated  Urine output good  CT output low  CBC    Component Value Date/Time   WBC 21.7* 08/30/2015 1315   RBC 4.45 08/30/2015 1315   HGB 13.3 08/30/2015 1322   HCT 39.0 08/30/2015 1322   PLT 199 08/30/2015 1315   MCV 87.9 08/30/2015 1315   MCH 29.0 08/30/2015 1315   MCHC 33.0 08/30/2015 1315   RDW 13.3 08/30/2015 1315   LYMPHSABS 3.2 08/28/2015 0744   MONOABS 0.7 08/28/2015 0744   EOSABS 0.5 08/28/2015 0744   BASOSABS 0.1 08/28/2015 0744     BMET    Component Value Date/Time   NA 143 08/30/2015 1322   K 3.5 08/30/2015 1322   CL 102 08/30/2015 1208   CO2 26 08/30/2015 0109   GLUCOSE 117* 08/30/2015 1322   BUN 5* 08/30/2015 1208   CREATININE 0.70 08/30/2015 1208   CALCIUM 9.2 08/30/2015 0109   GFRNONAA >60 08/30/2015 0109   GFRAA >60 08/30/2015 0109     A/P:  Stable postop course. Continue current plans

## 2015-08-31 ENCOUNTER — Inpatient Hospital Stay (HOSPITAL_COMMUNITY): Payer: BLUE CROSS/BLUE SHIELD

## 2015-08-31 ENCOUNTER — Encounter (HOSPITAL_COMMUNITY): Payer: Self-pay | Admitting: Cardiothoracic Surgery

## 2015-08-31 DIAGNOSIS — Z720 Tobacco use: Secondary | ICD-10-CM

## 2015-08-31 DIAGNOSIS — Z951 Presence of aortocoronary bypass graft: Secondary | ICD-10-CM

## 2015-08-31 DIAGNOSIS — I2510 Atherosclerotic heart disease of native coronary artery without angina pectoris: Secondary | ICD-10-CM

## 2015-08-31 LAB — GLUCOSE, CAPILLARY
GLUCOSE-CAPILLARY: 113 mg/dL — AB (ref 65–99)
GLUCOSE-CAPILLARY: 110 mg/dL — AB (ref 65–99)
GLUCOSE-CAPILLARY: 118 mg/dL — AB (ref 65–99)
GLUCOSE-CAPILLARY: 132 mg/dL — AB (ref 65–99)
GLUCOSE-CAPILLARY: 131 mg/dL — AB (ref 65–99)
GLUCOSE-CAPILLARY: 111 mg/dL — AB (ref 65–99)
GLUCOSE-CAPILLARY: 131 mg/dL — AB (ref 65–99)
GLUCOSE-CAPILLARY: 113 mg/dL — AB (ref 65–99)
GLUCOSE-CAPILLARY: 168 mg/dL — AB (ref 65–99)
Glucose-Capillary: 107 mg/dL — ABNORMAL HIGH (ref 65–99)
Glucose-Capillary: 113 mg/dL — ABNORMAL HIGH (ref 65–99)
Glucose-Capillary: 116 mg/dL — ABNORMAL HIGH (ref 65–99)
Glucose-Capillary: 120 mg/dL — ABNORMAL HIGH (ref 65–99)
Glucose-Capillary: 129 mg/dL — ABNORMAL HIGH (ref 65–99)
Glucose-Capillary: 130 mg/dL — ABNORMAL HIGH (ref 65–99)
Glucose-Capillary: 138 mg/dL — ABNORMAL HIGH (ref 65–99)
Glucose-Capillary: 143 mg/dL — ABNORMAL HIGH (ref 65–99)

## 2015-08-31 LAB — CREATININE, SERUM
Creatinine, Ser: 0.83 mg/dL (ref 0.61–1.24)
GFR calc Af Amer: >60 mL/min (ref >60)
GFR calc non Af Amer: >60 mL/min (ref >60)

## 2015-08-31 LAB — POCT I-STAT, CHEM 8
BUN: 7 mg/dL (ref 6–20)
CALCIUM ION: 1.26 mmol/L — AB (ref 1.12–1.23)
CREATININE: 0.7 mg/dL (ref 0.61–1.24)
Chloride: 102 mmol/L (ref 101–111)
Glucose, Bld: 122 mg/dL — ABNORMAL HIGH (ref 65–99)
HEMATOCRIT: 33 % — AB (ref 39.0–52.0)
Hemoglobin: 11.2 g/dL — ABNORMAL LOW (ref 13.0–17.0)
POTASSIUM: 3.6 mmol/L (ref 3.5–5.1)
SODIUM: 137 mmol/L (ref 135–145)
TCO2: 21 mmol/L (ref 0–100)

## 2015-08-31 LAB — CBC
HCT: 35.6 % — ABNORMAL LOW (ref 39.0–52.0)
HEMATOCRIT: 34.4 % — AB (ref 39.0–52.0)
HEMOGLOBIN: 11.4 g/dL — AB (ref 13.0–17.0)
Hemoglobin: 11.7 g/dL — ABNORMAL LOW (ref 13.0–17.0)
MCH: 29 pg (ref 26.0–34.0)
MCH: 29.3 pg (ref 26.0–34.0)
MCHC: 32.9 g/dL (ref 30.0–36.0)
MCHC: 33.1 g/dL (ref 30.0–36.0)
MCV: 88.1 fL (ref 78.0–100.0)
MCV: 88.4 fL (ref 78.0–100.0)
Platelets: 218 10*3/uL (ref 150–400)
Platelets: 198 10*3/uL (ref 150–400)
RBC: 4.04 MIL/uL — ABNORMAL LOW (ref 4.22–5.81)
RBC: 3.89 MIL/uL — ABNORMAL LOW (ref 4.22–5.81)
RDW: 13.6 % (ref 11.5–15.5)
RDW: 13.6 % (ref 11.5–15.5)
WBC: 19.4 10*3/uL — ABNORMAL HIGH (ref 4.0–10.5)
WBC: 17.7 10*3/uL — ABNORMAL HIGH (ref 4.0–10.5)

## 2015-08-31 LAB — BASIC METABOLIC PANEL
Anion gap: 7 (ref 5–15)
BUN: 6 mg/dL (ref 6–20)
CHLORIDE: 109 mmol/L (ref 101–111)
CO2: 22 mmol/L (ref 22–32)
CREATININE: 0.8 mg/dL (ref 0.61–1.24)
Calcium: 8.5 mg/dL — ABNORMAL LOW (ref 8.9–10.3)
GFR calc non Af Amer: >60 mL/min (ref >60)
Glucose, Bld: 119 mg/dL — ABNORMAL HIGH (ref 65–99)
POTASSIUM: 3.9 mmol/L (ref 3.5–5.1)
SODIUM: 138 mmol/L (ref 135–145)

## 2015-08-31 LAB — MAGNESIUM
MAGNESIUM: 2.1 mg/dL (ref 1.7–2.4)
Magnesium: 2 mg/dL (ref 1.7–2.4)

## 2015-08-31 LAB — TYPE AND SCREEN
ABO/RH(D): A POS
Antibody Screen: NEGATIVE
Unit division: 0
Unit division: 0

## 2015-08-31 MED ORDER — CHLORHEXIDINE GLUCONATE 0.12 % MT SOLN
OROMUCOSAL | Status: AC
  Administered 2015-08-31: 15 mL
  Filled 2015-08-31: qty 15

## 2015-08-31 MED ORDER — FUROSEMIDE 10 MG/ML IJ SOLN
20.0000 mg | Freq: Two times a day (BID) | INTRAMUSCULAR | Status: DC
  Administered 2015-08-31 (×2): 20 mg via INTRAVENOUS
  Filled 2015-08-31 (×2): qty 2

## 2015-08-31 MED ORDER — MORPHINE SULFATE (PF) 2 MG/ML IV SOLN
1.0000 mg | INTRAVENOUS | Status: AC | PRN
  Administered 2015-08-31 (×6): 2 mg via INTRAVENOUS
  Filled 2015-08-31 (×4): qty 1

## 2015-08-31 MED ORDER — POTASSIUM CHLORIDE 10 MEQ/50ML IV SOLN
10.0000 meq | INTRAVENOUS | Status: AC
  Administered 2015-08-31 (×3): 10 meq via INTRAVENOUS
  Filled 2015-08-31 (×3): qty 50

## 2015-08-31 MED ORDER — INSULIN DETEMIR 100 UNIT/ML ~~LOC~~ SOLN
12.0000 [IU] | Freq: Two times a day (BID) | SUBCUTANEOUS | Status: DC
  Administered 2015-08-31 (×2): 12 [IU] via SUBCUTANEOUS
  Filled 2015-08-31 (×3): qty 0.12

## 2015-08-31 MED ORDER — INSULIN ASPART 100 UNIT/ML ~~LOC~~ SOLN
0.0000 [IU] | SUBCUTANEOUS | Status: DC
  Administered 2015-08-31: 4 [IU] via SUBCUTANEOUS
  Administered 2015-08-31 (×2): 2 [IU] via SUBCUTANEOUS

## 2015-08-31 MED ORDER — GUAIFENESIN ER 600 MG PO TB12
600.0000 mg | ORAL_TABLET | Freq: Two times a day (BID) | ORAL | Status: DC
  Administered 2015-08-31 – 2015-09-05 (×11): 600 mg via ORAL
  Filled 2015-08-31 (×10): qty 1

## 2015-08-31 MED ORDER — LEVALBUTEROL HCL 1.25 MG/0.5ML IN NEBU
1.2500 mg | INHALATION_SOLUTION | Freq: Three times a day (TID) | RESPIRATORY_TRACT | Status: DC
  Administered 2015-09-01 – 2015-09-03 (×7): 1.25 mg via RESPIRATORY_TRACT
  Filled 2015-08-31 (×7): qty 0.5

## 2015-08-31 MED ORDER — KETOROLAC TROMETHAMINE 15 MG/ML IJ SOLN
15.0000 mg | Freq: Four times a day (QID) | INTRAMUSCULAR | Status: DC
  Administered 2015-08-31 – 2015-09-01 (×7): 15 mg via INTRAVENOUS
  Filled 2015-08-31 (×7): qty 1

## 2015-08-31 MED ORDER — POTASSIUM CHLORIDE 10 MEQ/50ML IV SOLN
10.0000 meq | INTRAVENOUS | Status: AC
  Administered 2015-08-31 (×2): 10 meq via INTRAVENOUS

## 2015-08-31 MED ORDER — INSULIN ASPART 100 UNIT/ML ~~LOC~~ SOLN
3.0000 [IU] | Freq: Three times a day (TID) | SUBCUTANEOUS | Status: DC
  Administered 2015-09-01 – 2015-09-02 (×5): 3 [IU] via SUBCUTANEOUS

## 2015-08-31 NOTE — Progress Notes (Signed)
      [address]       [place] [id number]             [phone]      POD # 1 CABG x 2  Visiting with friends  BP 105/62 mmHg  Pulse 80  Temp(Src) 98.3 F (36.8 C) (Oral)  Resp 10  Ht 5\' 5"  (1.651 m)  Wt 194 lb 0.1 oz (88 kg)  BMI 32.28 kg/m2  SpO2 95%  Intake/Output Summary (Last 24 hours) at 08/31/15 1811 Last data filed at 08/31/15 1600  Gross per 24 hour  Intake 1616.5 ml  Output   2375 ml  Net -758.5 ml  CBG well controlled K= 3.6- supplement Hct= 34  Doing well POD # 1  Darwin Rothlisberger C. Dorris FetchHendrickson, MD [facility] [phone]

## 2015-08-31 NOTE — Progress Notes (Signed)
1 Day Post-Op Procedure(s) (LRB): CORONARY ARTERY BYPASS GRAFTING (CABG) x2 using left internal mammary artery and right greater saphenous vein.  (N/A) TRANSESOPHAGEAL ECHOCARDIOGRAM (TEE) (N/A) Subjective: Unstable angina with new dx DM CODP Extubated, nsr Objective: Vital signs in last 24 hours: Temp:  [97.9 F (36.6 C)-99.1 F (37.3 C)] 98.2 F (36.8 C) (05/26 0715) Pulse Rate:  [64-100] 80 (05/26 0730) Cardiac Rhythm:  [-] Atrial paced (05/26 0700) Resp:  [9-23] 13 (05/26 0730) BP: (89-144)/(54-83) 106/65 mmHg (05/26 0600) SpO2:  [88 %-100 %] 92 % (05/26 0730) Arterial Line BP: (86-126)/(50-90) 97/50 mmHg (05/26 0730) FiO2 (%):  [40 %-50 %] 40 % (05/25 1704) Weight:  [194 lb 0.1 oz (88 kg)] 194 lb 0.1 oz (88 kg) (05/26 0400)  Hemodynamic parameters for last 24 hours: PAP: (21-71)/(11-44) 32/18 mmHg CO:  [4.1 L/min-6.6 L/min] 6.6 L/min CI:  [2.1 L/min/m2-3.4 L/min/m2] 3.4 L/min/m2  Intake/Output from previous day: 05/25 0701 - 05/26 0700 In: 4878.4 [I.V.:3003.4; Blood:525; IV Piggyback:1350] Out: 4345 [Urine:2850; Blood:1100; Chest Tube:395] Intake/Output this shift:         Exam    General- alert and comfortable   Lungs- clear without rales, wheezes   Cor- regular rate and rhythm, no murmur , gallop   Abdomen- soft, non-tender   Extremities - warm, non-tender, minimal edema   Neuro- oriented, appropriate, no focal weakness   Lab Results:  Recent Labs  08/30/15 2141 08/31/15 0405  WBC 21.0* 19.4*  HGB 12.2* 11.7*  HCT 36.9* 35.6*  PLT 227 218   BMET:  Recent Labs  08/30/15 0109  08/30/15 1953 08/30/15 2141 08/31/15 0405  NA 141  < > 141  --  138  K 3.5  < > 4.7  --  3.9  CL 108  < > 108  --  109  CO2 26  --   --   --  22  GLUCOSE 146*  < > 135*  --  119*  BUN 7  < > 5*  --  6  CREATININE 0.74  < > 0.70 0.83 0.80  CALCIUM 9.2  --   --   --  8.5*  < > = values in this interval not displayed.  PT/INR:  Recent Labs  08/30/15 1315  LABPROT  16.3*  INR 1.30   ABG    Component Value Date/Time   PHART 7.338* 08/30/2015 1838   HCO3 20.2 08/30/2015 1838   TCO2 21 08/30/2015 1953   ACIDBASEDEF 5.0* 08/30/2015 1838   O2SAT 89.0 08/30/2015 1838   CBG (last 3)   Recent Labs  08/30/15 2158 08/30/15 2300 08/31/15 0007  GLUCAP 144* 139* 120*    Assessment/Plan: S/P Procedure(s) (LRB): CORONARY ARTERY BYPASS GRAFTING (CABG) x2 using left internal mammary artery and right greater saphenous vein.  (N/A) TRANSESOPHAGEAL ECHOCARDIOGRAM (TEE) (N/A) Mobilize Diuresis Diabetes control See progression orders   LOS: 3 days    Justin Olson 08/31/2015

## 2015-08-31 NOTE — Progress Notes (Signed)
Subjective:  POD #1 CABG X2 with [place]--> LAD and SVG--> RI for LM Dz  Objective:  Temp:  [97.9 F (36.6 C)-99.1 F (37.3 C)] 98.4 F (36.9 C) (05/26 0830) Pulse Rate:  [64-100] 80 (05/26 0830) Resp:  [9-23] 17 (05/26 0830) BP: (89-144)/(49-83) 96/49 mmHg (05/26 0800) SpO2:  [88 %-100 %] 92 % (05/26 0830) Arterial Line BP: (86-126)/(50-90) 94/52 mmHg (05/26 0830) FiO2 (%):  [40 %-50 %] 40 % (05/25 1704) Weight:  [194 lb 0.1 oz (88 kg)] 194 lb 0.1 oz (88 kg) (05/26 0400) Weight change:   Intake/Output from previous day: 05/25 0701 - 05/26 0700 In: 4878.4 [I.V.:3003.4; Blood:525; IV [id number]:4696] Out: 2952 [Urine:2850; Blood:1100; Chest Tube:395]  Intake/Output from this shift: Total I/O In: 41.2 [I.V.:41.2] Out: 85 [Urine:65; Chest Tube:20]  Physical Exam: General appearance: alert and no distress Neck: no adenopathy, no carotid bruit, no JVD, supple, symmetrical, trachea midline and thyroid not enlarged, symmetric, no tenderness/mass/nodules Lungs: clear to auscultation bilaterally Heart: regular rate and rhythm, S1, S2 normal, no murmur, click, rub or gallop Extremities: extremities normal, atraumatic, no cyanosis or edema  Lab Results: Results for orders placed or performed during the hospital encounter of 08/28/15 (from the past 48 hour(s))  Heparin level (unfractionated)     Status: Abnormal   Collection Time: 08/29/15  9:11 AM  Result Value Ref Range   Heparin Unfractionated <0.10 (L) 0.30 - 0.70 IU/mL    Comment:        IF HEPARIN RESULTS ARE BELOW EXPECTED VALUES, AND PATIENT DOSAGE HAS BEEN CONFIRMED, SUGGEST FOLLOW UP TESTING OF ANTITHROMBIN III LEVELS. SPECIMEN CHECKED FOR CLOTS REPEATED TO VERIFY   Comprehensive metabolic panel     Status: Abnormal   Collection Time: 08/29/15  9:11 AM  Result Value Ref Range   Sodium 139 135 - 145 mmol/L   Potassium 3.7 3.5 - 5.1 mmol/L   Chloride 109 101 - 111 mmol/L   CO2 24 22 - 32 mmol/L   Glucose, Bld  175 (H) 65 - 99 mg/dL   BUN 6 6 - 20 mg/dL   Creatinine, Ser 0.82 0.61 - 1.24 mg/dL   Calcium 8.9 8.9 - 10.3 mg/dL   Total Protein 6.0 (L) 6.5 - 8.1 g/dL   Albumin 3.1 (L) 3.5 - 5.0 g/dL   AST 20 15 - 41 U/L   ALT 19 17 - 63 U/L   Alkaline Phosphatase 78 38 - 126 U/L   Total Bilirubin 0.4 0.3 - 1.2 mg/dL   GFR calc non Af Amer >60 >60 mL/min   GFR calc Af Amer >60 >60 mL/min    Comment: (NOTE) The eGFR has been calculated using the CKD EPI equation. This calculation has not been validated in all clinical situations. eGFR's persistently <60 mL/min signify possible Chronic Kidney Disease.    Anion gap 6 5 - 15  Type and screen [place]     Status: None (Preliminary result)   Collection Time: 08/29/15  9:19 AM  Result Value Ref Range   ABO/RH(D) A POS    Antibody Screen NEG    Sample Expiration 09/01/2015    Unit Number [id number]    Blood Component Type RED CELLS,LR    Unit division 00    Status of Unit ALLOCATED    Transfusion Status OK TO TRANSFUSE    Crossmatch Result Compatible    Unit Number [id number]    Blood Component Type RED CELLS,LR    Unit division 00  Status of Unit ALLOCATED    Transfusion Status OK TO TRANSFUSE    Crossmatch Result Compatible   Prepare RBC (crossmatch)     Status: None   Collection Time: 08/29/15  9:19 AM  Result Value Ref Range   Order Confirmation ORDER PROCESSED BY BLOOD BANK   ABO/Rh     Status: None   Collection Time: 08/29/15  9:19 AM  Result Value Ref Range   ABO/RH(D) A POS   I-STAT 3, arterial blood gas (G3+)     Status: Abnormal   Collection Time: 08/29/15  9:45 AM  Result Value Ref Range   pH, Arterial 7.403 7.350 - 7.450   pCO2 arterial 34.1 (L) 35.0 - 45.0 mmHg   pO2, Arterial 74.0 (L) 80.0 - 100.0 mmHg   Bicarbonate 21.4 20.0 - 24.0 mEq/L   TCO2 22 0 - 100 mmol/L   O2 Saturation 95.0 %   Acid-base deficit 3.0 (H) 0.0 - 2.0 mmol/L   Patient temperature 97.5 F    Collection site RADIAL,  ALLEN'S TEST ACCEPTABLE    Drawn by Operator    Sample type ARTERIAL   Glucose, capillary     Status: Abnormal   Collection Time: 08/29/15  1:12 PM  Result Value Ref Range   Glucose-Capillary 172 (H) 65 - 99 mg/dL   Comment 1 Notify RN   Glucose, capillary     Status: Abnormal   Collection Time: 08/29/15  3:56 PM  Result Value Ref Range   Glucose-Capillary 163 (H) 65 - 99 mg/dL   Comment 1 Capillary Specimen   Heparin level (unfractionated)     Status: Abnormal   Collection Time: 08/29/15  4:32 PM  Result Value Ref Range   Heparin Unfractionated 0.12 (L) 0.30 - 0.70 IU/mL    Comment:        IF HEPARIN RESULTS ARE BELOW EXPECTED VALUES, AND PATIENT DOSAGE HAS BEEN CONFIRMED, SUGGEST FOLLOW UP TESTING OF ANTITHROMBIN III LEVELS.   Glucose, capillary     Status: Abnormal   Collection Time: 08/29/15  9:47 PM  Result Value Ref Range   Glucose-Capillary 163 (H) 65 - 99 mg/dL  Heparin level (unfractionated)     Status: None   Collection Time: 08/30/15  1:09 AM  Result Value Ref Range   Heparin Unfractionated 0.44 0.30 - 0.70 IU/mL    Comment:        IF HEPARIN RESULTS ARE BELOW EXPECTED VALUES, AND PATIENT DOSAGE HAS BEEN CONFIRMED, SUGGEST FOLLOW UP TESTING OF ANTITHROMBIN III LEVELS.   CBC     Status: Abnormal   Collection Time: 08/30/15  1:09 AM  Result Value Ref Range   WBC 12.3 (H) 4.0 - 10.5 K/uL   RBC 4.73 4.22 - 5.81 MIL/uL   Hemoglobin 13.8 13.0 - 17.0 g/dL   HCT 41.3 39.0 - 52.0 %   MCV 87.3 78.0 - 100.0 fL   MCH 29.2 26.0 - 34.0 pg   MCHC 33.4 30.0 - 36.0 g/dL   RDW 13.1 11.5 - 15.5 %   Platelets 343 150 - 400 K/uL  Basic metabolic panel     Status: Abnormal   Collection Time: 08/30/15  1:09 AM  Result Value Ref Range   Sodium 141 135 - 145 mmol/L   Potassium 3.5 3.5 - 5.1 mmol/L   Chloride 108 101 - 111 mmol/L   CO2 26 22 - 32 mmol/L   Glucose, Bld 146 (H) 65 - 99 mg/dL   BUN 7 6 - 20 mg/dL  Creatinine, Ser 0.74 0.61 - 1.24 mg/dL   Calcium 9.2 8.9 -  10.3 mg/dL   GFR calc non Af Amer >60 >60 mL/min   GFR calc Af Amer >60 >60 mL/min    Comment: (NOTE) The eGFR has been calculated using the CKD EPI equation. This calculation has not been validated in all clinical situations. eGFR's persistently <60 mL/min signify possible Chronic Kidney Disease.    Anion gap 7 5 - 15  I-STAT, chem 8     Status: Abnormal   Collection Time: 08/30/15  8:01 AM  Result Value Ref Range   Sodium 142 135 - 145 mmol/L   Potassium 4.0 3.5 - 5.1 mmol/L   Chloride 105 101 - 111 mmol/L   BUN 5 (L) 6 - 20 mg/dL   Creatinine, Ser 0.50 (L) 0.61 - 1.24 mg/dL   Glucose, Bld 141 (H) 65 - 99 mg/dL   Calcium, Ion 1.22 1.12 - 1.23 mmol/L   TCO2 24 0 - 100 mmol/L   Hemoglobin 13.3 13.0 - 17.0 g/dL   HCT 39.0 39.0 - 52.0 %  I-STAT, chem 8     Status: Abnormal   Collection Time: 08/30/15  9:44 AM  Result Value Ref Range   Sodium 142 135 - 145 mmol/L   Potassium 4.2 3.5 - 5.1 mmol/L   Chloride 105 101 - 111 mmol/L   BUN 5 (L) 6 - 20 mg/dL   Creatinine, Ser 0.60 (L) 0.61 - 1.24 mg/dL   Glucose, Bld 135 (H) 65 - 99 mg/dL   Calcium, Ion 1.26 (H) 1.12 - 1.23 mmol/L   TCO2 26 0 - 100 mmol/L   Hemoglobin 13.3 13.0 - 17.0 g/dL   HCT 39.0 39.0 - 52.0 %  I-STAT 3, arterial blood gas (G3+)     Status: Abnormal   Collection Time: 08/30/15 10:03 AM  Result Value Ref Range   pH, Arterial 7.296 (L) 7.350 - 7.450   pCO2 arterial 54.2 (H) 35.0 - 45.0 mmHg   pO2, Arterial 409.0 (H) 80.0 - 100.0 mmHg   Bicarbonate 26.4 (H) 20.0 - 24.0 mEq/L   TCO2 28 0 - 100 mmol/L   O2 Saturation 100.0 %   Acid-base deficit 1.0 0.0 - 2.0 mmol/L   Patient temperature HIDE    Sample type ARTERIAL   I-STAT, chem 8     Status: Abnormal   Collection Time: 08/30/15 10:06 AM  Result Value Ref Range   Sodium 142 135 - 145 mmol/L   Potassium 4.0 3.5 - 5.1 mmol/L   Chloride 102 101 - 111 mmol/L   BUN 4 (L) 6 - 20 mg/dL   Creatinine, Ser 0.50 (L) 0.61 - 1.24 mg/dL   Glucose, Bld 123 (H) 65 - 99  mg/dL   Calcium, Ion 1.07 (L) 1.12 - 1.23 mmol/L   TCO2 28 0 - 100 mmol/L   Hemoglobin 11.2 (L) 13.0 - 17.0 g/dL   HCT 33.0 (L) 39.0 - 52.0 %  Hemoglobin and hematocrit, blood     Status: Abnormal   Collection Time: 08/30/15 10:45 AM  Result Value Ref Range   Hemoglobin 10.5 (L) 13.0 - 17.0 g/dL    Comment: REPEATED TO VERIFY   HCT 31.2 (L) 39.0 - 52.0 %  Platelet count     Status: None   Collection Time: 08/30/15 10:45 AM  Result Value Ref Range   Platelets 254 150 - 400 K/uL  I-STAT, chem 8     Status: Abnormal   Collection Time: 08/30/15 10:50  AM  Result Value Ref Range   Sodium 136 135 - 145 mmol/L   Potassium 4.7 3.5 - 5.1 mmol/L   Chloride 102 101 - 111 mmol/L   BUN 4 (L) 6 - 20 mg/dL   Creatinine, Ser 0.50 (L) 0.61 - 1.24 mg/dL   Glucose, Bld 153 (H) 65 - 99 mg/dL   Calcium, Ion 1.07 (L) 1.12 - 1.23 mmol/L   TCO2 23 0 - 100 mmol/L   Hemoglobin 10.5 (L) 13.0 - 17.0 g/dL   HCT 31.0 (L) 39.0 - 52.0 %  I-STAT 3, arterial blood gas (G3+)     Status: Abnormal   Collection Time: 08/30/15 12:05 PM  Result Value Ref Range   pH, Arterial 7.293 (L) 7.350 - 7.450   pCO2 arterial 48.0 (H) 35.0 - 45.0 mmHg   pO2, Arterial 112.0 (H) 80.0 - 100.0 mmHg   Bicarbonate 23.2 20.0 - 24.0 mEq/L   TCO2 25 0 - 100 mmol/L   O2 Saturation 98.0 %   Acid-base deficit 3.0 (H) 0.0 - 2.0 mmol/L   Patient temperature HIDE    Sample type ARTERIAL   I-STAT, chem 8     Status: Abnormal   Collection Time: 08/30/15 12:08 PM  Result Value Ref Range   Sodium 140 135 - 145 mmol/L   Potassium 3.9 3.5 - 5.1 mmol/L   Chloride 102 101 - 111 mmol/L   BUN 5 (L) 6 - 20 mg/dL   Creatinine, Ser 0.70 0.61 - 1.24 mg/dL   Glucose, Bld 192 (H) 65 - 99 mg/dL   Calcium, Ion 1.12 1.12 - 1.23 mmol/L   TCO2 23 0 - 100 mmol/L   Hemoglobin 11.6 (L) 13.0 - 17.0 g/dL   HCT 34.0 (L) 39.0 - 52.0 %  CBC     Status: Abnormal   Collection Time: 08/30/15  1:15 PM  Result Value Ref Range   WBC 21.7 (H) 4.0 - 10.5 K/uL    RBC 4.45 4.22 - 5.81 MIL/uL   Hemoglobin 12.9 (L) 13.0 - 17.0 g/dL   HCT 39.1 39.0 - 52.0 %   MCV 87.9 78.0 - 100.0 fL   MCH 29.0 26.0 - 34.0 pg   MCHC 33.0 30.0 - 36.0 g/dL   RDW 13.3 11.5 - 15.5 %   Platelets 199 150 - 400 K/uL  Protime-INR     Status: Abnormal   Collection Time: 08/30/15  1:15 PM  Result Value Ref Range   Prothrombin Time 16.3 (H) 11.6 - 15.2 seconds   INR 1.30 0.00 - 1.49  APTT     Status: None   Collection Time: 08/30/15  1:15 PM  Result Value Ref Range   aPTT 30 24 - 37 seconds  I-STAT 4, (NA,K, GLUC, HGB,HCT)     Status: Abnormal   Collection Time: 08/30/15  1:22 PM  Result Value Ref Range   Sodium 143 135 - 145 mmol/L   Potassium 3.5 3.5 - 5.1 mmol/L   Glucose, Bld 117 (H) 65 - 99 mg/dL   HCT 39.0 39.0 - 52.0 %   Hemoglobin 13.3 13.0 - 17.0 g/dL  I-STAT 3, arterial blood gas (G3+)     Status: Abnormal   Collection Time: 08/30/15  1:27 PM  Result Value Ref Range   pH, Arterial 7.291 (L) 7.350 - 7.450   pCO2 arterial 47.0 (H) 35.0 - 45.0 mmHg   pO2, Arterial 76.0 (L) 80.0 - 100.0 mmHg   Bicarbonate 22.7 20.0 - 24.0 mEq/L   TCO2 24 0 -  100 mmol/L   O2 Saturation 93.0 %   Acid-base deficit 4.0 (H) 0.0 - 2.0 mmol/L   Patient temperature 36.8 C    Collection site ARTERIAL LINE    Drawn by Nurse    Sample type ARTERIAL   Glucose, capillary     Status: None   Collection Time: 08/30/15  2:42 PM  Result Value Ref Range   Glucose-Capillary 95 65 - 99 mg/dL  Glucose, capillary     Status: None   Collection Time: 08/30/15  3:50 PM  Result Value Ref Range   Glucose-Capillary 80 65 - 99 mg/dL  Rapid urine drug screen (hospital performed)     Status: Abnormal   Collection Time: 08/30/15  4:17 PM  Result Value Ref Range   Opiates POSITIVE (A) NONE DETECTED   Cocaine NONE DETECTED NONE DETECTED   Benzodiazepines POSITIVE (A) NONE DETECTED   Amphetamines NONE DETECTED NONE DETECTED   Tetrahydrocannabinol NONE DETECTED NONE DETECTED   Barbiturates NONE  DETECTED NONE DETECTED    Comment:        DRUG SCREEN FOR MEDICAL PURPOSES ONLY.  IF CONFIRMATION IS NEEDED FOR ANY PURPOSE, NOTIFY LAB WITHIN 5 DAYS.        LOWEST DETECTABLE LIMITS FOR URINE DRUG SCREEN Drug Class       Cutoff (ng/mL) Amphetamine      1000 Barbiturate      200 Benzodiazepine   017 Tricyclics       510 Opiates          300 Cocaine          300 THC              50   Glucose, capillary     Status: Abnormal   Collection Time: 08/30/15  4:47 PM  Result Value Ref Range   Glucose-Capillary 114 (H) 65 - 99 mg/dL  I-STAT 3, arterial blood gas (G3+)     Status: Abnormal   Collection Time: 08/30/15  5:26 PM  Result Value Ref Range   pH, Arterial 7.334 (L) 7.350 - 7.450   pCO2 arterial 40.5 35.0 - 45.0 mmHg   pO2, Arterial 96.0 80.0 - 100.0 mmHg   Bicarbonate 21.5 20.0 - 24.0 mEq/L   TCO2 23 0 - 100 mmol/L   O2 Saturation 97.0 %   Acid-base deficit 4.0 (H) 0.0 - 2.0 mmol/L   Patient temperature 37.2 C    Collection site ARTERIAL LINE    Drawn by Operator    Sample type ARTERIAL   Glucose, capillary     Status: Abnormal   Collection Time: 08/30/15  5:47 PM  Result Value Ref Range   Glucose-Capillary 120 (H) 65 - 99 mg/dL  I-STAT 3, arterial blood gas (G3+)     Status: Abnormal   Collection Time: 08/30/15  6:38 PM  Result Value Ref Range   pH, Arterial 7.338 (L) 7.350 - 7.450   pCO2 arterial 37.7 35.0 - 45.0 mmHg   pO2, Arterial 60.0 (L) 80.0 - 100.0 mmHg   Bicarbonate 20.2 20.0 - 24.0 mEq/L   TCO2 21 0 - 100 mmol/L   O2 Saturation 89.0 %   Acid-base deficit 5.0 (H) 0.0 - 2.0 mmol/L   Patient temperature 37.2 C    Collection site RADIAL, ALLEN'S TEST ACCEPTABLE    Drawn by VP    Sample type ARTERIAL   Glucose, capillary     Status: Abnormal   Collection Time: 08/30/15  6:41 PM  Result Value Ref Range  Glucose-Capillary 130 (H) 65 - 99 mg/dL  Glucose, capillary     Status: Abnormal   Collection Time: 08/30/15  7:51 PM  Result Value Ref Range    Glucose-Capillary 126 (H) 65 - 99 mg/dL   Comment 1 Arterial Specimen   I-STAT, chem 8     Status: Abnormal   Collection Time: 08/30/15  7:53 PM  Result Value Ref Range   Sodium 141 135 - 145 mmol/L   Potassium 4.7 3.5 - 5.1 mmol/L   Chloride 108 101 - 111 mmol/L   BUN 5 (L) 6 - 20 mg/dL   Creatinine, Ser 0.70 0.61 - 1.24 mg/dL   Glucose, Bld 135 (H) 65 - 99 mg/dL   Calcium, Ion 1.22 1.12 - 1.23 mmol/L   TCO2 21 0 - 100 mmol/L   Hemoglobin 12.2 (L) 13.0 - 17.0 g/dL   HCT 36.0 (L) 39.0 - 52.0 %  Glucose, capillary     Status: Abnormal   Collection Time: 08/30/15  8:55 PM  Result Value Ref Range   Glucose-Capillary 138 (H) 65 - 99 mg/dL  CBC     Status: Abnormal   Collection Time: 08/30/15  9:41 PM  Result Value Ref Range   WBC 21.0 (H) 4.0 - 10.5 K/uL   RBC 4.18 (L) 4.22 - 5.81 MIL/uL   Hemoglobin 12.2 (L) 13.0 - 17.0 g/dL   HCT 36.9 (L) 39.0 - 52.0 %   MCV 88.3 78.0 - 100.0 fL   MCH 29.2 26.0 - 34.0 pg   MCHC 33.1 30.0 - 36.0 g/dL   RDW 13.6 11.5 - 15.5 %   Platelets 227 150 - 400 K/uL  Magnesium     Status: None   Collection Time: 08/30/15  9:41 PM  Result Value Ref Range   Magnesium 2.4 1.7 - 2.4 mg/dL  Creatinine, serum     Status: None   Collection Time: 08/30/15  9:41 PM  Result Value Ref Range   Creatinine, Ser 0.83 0.61 - 1.24 mg/dL   GFR calc non Af Amer >60 >60 mL/min   GFR calc Af Amer >60 >60 mL/min    Comment: (NOTE) The eGFR has been calculated using the CKD EPI equation. This calculation has not been validated in all clinical situations. eGFR's persistently <60 mL/min signify possible Chronic Kidney Disease.   Glucose, capillary     Status: Abnormal   Collection Time: 08/30/15  9:58 PM  Result Value Ref Range   Glucose-Capillary 144 (H) 65 - 99 mg/dL   Comment 1 Arterial Specimen   Glucose, capillary     Status: Abnormal   Collection Time: 08/30/15 11:00 PM  Result Value Ref Range   Glucose-Capillary 139 (H) 65 - 99 mg/dL   Comment 1 Arterial  Specimen   Glucose, capillary     Status: Abnormal   Collection Time: 08/31/15 12:07 AM  Result Value Ref Range   Glucose-Capillary 120 (H) 65 - 99 mg/dL  CBC     Status: Abnormal   Collection Time: 08/31/15  4:05 AM  Result Value Ref Range   WBC 19.4 (H) 4.0 - 10.5 K/uL   RBC 4.04 (L) 4.22 - 5.81 MIL/uL   Hemoglobin 11.7 (L) 13.0 - 17.0 g/dL   HCT 35.6 (L) 39.0 - 52.0 %   MCV 88.1 78.0 - 100.0 fL   MCH 29.0 26.0 - 34.0 pg   MCHC 32.9 30.0 - 36.0 g/dL   RDW 13.6 11.5 - 15.5 %   Platelets 218 150 - 400  K/uL  Basic metabolic panel     Status: Abnormal   Collection Time: 08/31/15  4:05 AM  Result Value Ref Range   Sodium 138 135 - 145 mmol/L   Potassium 3.9 3.5 - 5.1 mmol/L    Comment: DELTA CHECK NOTED   Chloride 109 101 - 111 mmol/L   CO2 22 22 - 32 mmol/L   Glucose, Bld 119 (H) 65 - 99 mg/dL   BUN 6 6 - 20 mg/dL   Creatinine, Ser 0.80 0.61 - 1.24 mg/dL   Calcium 8.5 (L) 8.9 - 10.3 mg/dL   GFR calc non Af Amer >60 >60 mL/min   GFR calc Af Amer >60 >60 mL/min    Comment: (NOTE) The eGFR has been calculated using the CKD EPI equation. This calculation has not been validated in all clinical situations. eGFR's persistently <60 mL/min signify possible Chronic Kidney Disease.    Anion gap 7 5 - 15  Magnesium     Status: None   Collection Time: 08/31/15  4:05 AM  Result Value Ref Range   Magnesium 2.1 1.7 - 2.4 mg/dL    Imaging: Imaging results have been reviewed  Tele- Paced rhythm   Assessment/Plan:   1. Principal Problem: 2.   NSTEMI (non-ST elevated myocardial infarction) ([place]) 3. Active Problems: 4.   Tobacco abuse 5.   Family history of early CAD 39.   CAD (coronary artery disease) 7.   Dyslipidemia 8.   Type II diabetes mellitus ([place]) 9.   Time Spent Directly with Patient:  15 minutes  Length of Stay:  LOS: 3 days   POD #1 CABG X2 for LM Dz and accelerated angina. Nl LV Fxn. Extubated. Alert. VSS. Rhythm paced. On no drips. :abs OK. I/O fairly even.  Nl progression. Will see again when on tele. F/U with me after D/C home.   Quay Burow 08/31/2015, 9:01 AM

## 2015-08-31 NOTE — Op Note (Signed)
Justin Olson, Justin Olson                   ACCOUNT NO.:  [id number]  MEDICAL RECORD NO.:  [id number]  LOCATION:  2S02C                        FACILITY:  [facility]  PHYSICIAN:  Kerin Perna, M.D.  DATE OF BIRTH:  [birth year]  DATE OF PROCEDURE:  08/30/2015 DATE OF DISCHARGE:                              OPERATIVE REPORT   OPERATION: 1. Coronary artery bypass grafting x2 (left internal mammary artery to     LAD, saphenous vein graft to ramus intermediate). 2. Endoscopic harvest of right leg greater saphenous vein.  SURGEON:  Kerin Perna, M.D.  ASSISTANT:  Doree Fudge, PA-C.  ANESTHESIA:  General.  PREOPERATIVE DIAGNOSIS:  Unstable angina, 95% left main stenosis.  POSTOPERATIVE DIAGNOSIS:  Unstable angina, 95% left main stenosis.  CLINICAL NOTE:  The patient is a 50 year old Caucasian male, smoker, with diabetes untreated, who presents with unstable angina.  Cardiac catheterization demonstrates 95% left main stenosis with a large dominant right coronary with minimal disease.  Overall LV function appears to be well maintained.  The patient was placed on IV heparin and nitroglycerin.  Surgical consultation was requested.  I examined the patient in his hospital room and reviewed the results of the cardiac catheterization and reviewed the images and discussed those and counseled those with the patient and his wife.  I agreed with recommendation by Cardiology for surgical revascularization of his 95% left main stenosis. I discussed the details of surgery with the patient including the use of general anesthesia and cardiopulmonary bypass, the location of the surgical incisions, and the expected postoperative hospital recovery.  I discussed with the patient, the major risks to him of coronary artery bypass surgery including the risks of stroke, MI, bleeding, blood transfusion requirement, postoperative infection, postoperative pulmonary problems including pleural effusion, and  death.  After reviewing these issues, he demonstrated his understanding and agrees to proceed with surgery under what I felt was an informed consent.  FINDINGS: 1. Adequate conduit. 2. Very small LAD and circumflex targets, suboptimal. 3. Preserved LV systolic function after successful CABG x2 after     separation of cardiopulmonary bypass and TEE assessment of LV     function.  OPERATIVE PROCEDURE:  The patient was brought to the operating room and placed supine on the operating table.  General anesthesia was induced under invasive hemodynamic monitoring.  The chest, abdomen, and legs were prepped with Betadine and draped as a sterile field.  A proper time- out was performed.  A sternal incision was made as the saphenous vein was harvested endoscopically from the right leg.  The left internal mammary artery was harvested as a pedicle graft from its origin at the subclavian vessels.  It was 1.4-mm vessel with good flow.  The sternal retractor was placed.  The pericardium was opened and suspended. Pursestrings were placed in the ascending aorta and right atrium and heparin was administered.  When the ACT was documented as being therapeutic, the patient was cannulated and placed on cardiopulmonary bypass.  The coronaries were identified for grafting.  They were poor targets.  They were small.  Cardioplegia cannulas were placed both antegrade and retrograde cold blood cardioplegia and the patient was cooled  to 50 degrees.  Aortic crossclamp was applied.  One liter of cold blood cardioplegia was delivered in split doses between the antegrade aortic and retrograde coronary sinus catheters.  There was good cardioplegic arrest, and septal temperature dropped less than 12 degrees.  Cardioplegia was delivered every 20 minutes.  The distal coronary anastomoses were performed.  The first distal anastomosis was the ramus intermediate branch of the left coronary. This was 1.4-mm vessel.  A  reverse saphenous vein was sewn end-to-side with running 7-0 Prolene.  There was good flow through the graft. Cardioplegia was redosed.  The second distal anastomosis was to the proximal LAD.  Distally, it was very small and non-graftable.  The anastomosis was placed at the bifurcation with the first diagonal branch.  Here the vessel was becoming intramyocardial.  It was 1.4 mm in diameter.  The left IMA pedicle was brought through an opening in the left lateral pericardium, was brought down onto the LAD and sewn end-to-side with running 8-0 Prolene.  There was good flow through the anastomosis after briefly releasing the pedicle bulldog on the mammary artery.  The bulldog clamp was replaced and the pedicle was secured with 6-0 Prolene.  Cardioplegia was redosed.  With the crossclamp still in place, the proximal vein anastomosis was performed using a 4.5 mm punch and running 6-0 Prolene.  Prior to removing the crossclamp, air was vented from the coronaries with a dose of retrograde warm blood cardioplegia.  The crossclamp was removed.  The heart resumed a spontaneous rhythm. The vein graft was de-aired and opened.  Hemostasis was documented at the proximal and distal coronary anastomoses.  Temporary pacing wires were applied.  The patient was rewarmed and reperfused.  The lungs were expanded and ventilator was resumed.  It should be noted that the patient had very severe pulmonary disease from smoking and also the appearance of possible cocaine smoking and inhalation.  The patient was then weaned off cardiopulmonary bypass under monitoring with transesophageal echo.  Global LV function was preserved. Hemodynamics were stable.  Protamine was administered.  The cannulas were removed.  There was no adverse reaction to protamine.  The superior pericardial fat was closed over the aorta.  Anterior mediastinal and left pleural chest tubes were placed and brought out through  separate incisions.  The sternum was closed with a wire.  The pectoralis fascia was closed with a running #1 Vicryl.  The subcutaneous and skin layers were closed using running Vicryl and sterile dressings were applied. Total cardiopulmonary bypass time was 110 minutes.     Kerin PernaPeter Van Trigt, M.D.    PV/MEDQ  D:  08/30/2015  T:  08/31/2015  Job:  [id number]

## 2015-08-31 NOTE — Discharge Instructions (Signed)
Coronary Artery Bypass Grafting, Care After °Refer to this sheet in the next few weeks. These instructions provide you with information on caring for yourself after your procedure. Your health care provider may also give you more specific instructions. Your treatment has been planned according to current medical practices, but problems sometimes occur. Call your health care provider if you have any problems or questions after your procedure. °WHAT TO EXPECT AFTER THE PROCEDURE °Recovery from surgery will be different for everyone. Some people feel well after 3 or 4 weeks, while for others it takes longer. After your procedure, it is typical to have the following: °· Nausea and a lack of appetite.   °· Constipation. °· Weakness and fatigue.   °· Depression or irritability.   °· Pain or discomfort at your incision site. °HOME CARE INSTRUCTIONS °· Take medicines only as directed by your health care provider. Do not stop taking medicines or start any new medicines without first checking with your health care provider. °· Take your pulse as directed by your health care provider. °· Perform deep breathing as directed by your health care provider. If you were given a device called an incentive spirometer, use it to practice deep breathing several times a day. Support your chest with a pillow or your arms when you take deep breaths or cough. °· Keep incision areas clean, dry, and protected. Remove or change any bandages (dressings) only as directed by your health care provider. You may have skin adhesive strips over the incision areas. Do not take the strips off. They will fall off on their own. °· Check incision areas daily for any swelling, redness, or drainage. °· If incisions were made in your legs, do the following: °¨ Avoid crossing your legs.   °¨ Avoid sitting for long periods of time. Change positions every 30 minutes.   °¨ Elevate your legs when you are sitting. °· Wear compression stockings as directed by your  health care provider. These stockings help keep blood clots from forming in your legs. °· Take showers once your health care provider approves. Until then, only take sponge baths. Pat incisions dry. Do not rub incisions with a washcloth or towel. Do not take baths, swim, or use a hot tub until your health care provider approves. °· Eat foods that are high in fiber, such as raw fruits and vegetables, whole grains, beans, and nuts. Meats should be lean cut. Avoid canned, processed, and fried foods. °· Drink enough fluid to keep your urine clear or pale yellow. °· Weigh yourself every day. This helps identify if you are retaining fluid that may make your heart and lungs work harder. °· Rest and limit activity as directed by your health care provider. You may be instructed to: °¨ Stop any activity at once if you have chest pain, shortness of breath, irregular heartbeats, or dizziness. Get help right away if you have any of these symptoms. °¨ Move around frequently for short periods or take short walks as directed by your health care provider. Increase your activities gradually. You may need physical therapy or cardiac rehabilitation to help strengthen your muscles and build your endurance. °¨ Avoid lifting, pushing, or pulling anything heavier than 10 lb (4.5 kg) for at least 6 weeks after surgery. °· Do not drive until your health care provider approves.  °· Ask your health care provider when you may return to work. °· Ask your health care provider when you may resume sexual activity. °· Keep all follow-up visits as directed by your health care   provider. This is important. °SEEK MEDICAL CARE IF: °· You have swelling, redness, increasing pain, or drainage at the site of an incision. °· You have a fever. °· You have swelling in your ankles or legs. °· You have pain in your legs.   °· You gain 2 or more pounds (0.9 kg) a day. °· You are nauseous or vomit. °· You have diarrhea.  °SEEK IMMEDIATE MEDICAL CARE IF: °· You have  chest pain that goes to your jaw or arms. °· You have shortness of breath.   °· You have a fast or irregular heartbeat.   °· You notice a "clicking" in your breastbone (sternum) when you move.   °· You have numbness or weakness in your arms or legs. °· You feel dizzy or light-headed.   °MAKE SURE YOU: °· Understand these instructions. °· Will watch your condition. °· Will get help right away if you are not doing well or get worse. °  °This information is not intended to replace advice given to you by your health care provider. Make sure you discuss any questions you have with your health care provider. °  °Document Released: 10/11/2004 Document Revised: 04/14/2014 Document Reviewed: 08/31/2012 °Elsevier Interactive Patient Education ©2016 [employer] ° °Endoscopic Saphenous Vein Harvesting, Care After °Refer to this sheet in the next few weeks. These instructions provide you with information on caring for yourself after your procedure. Your health care provider may also give you more specific instructions. Your treatment has been planned according to current medical practices, but problems sometimes occur. Call your health care provider if you have any problems or questions after your procedure. °HOME CARE INSTRUCTIONS °Medicine °· Take whatever pain medicine your surgeon prescribes. Follow the directions carefully. Do not take over-the-counter pain medicine unless your surgeon says it is okay. Some pain medicine can cause bleeding problems for several weeks after surgery. °· Follow your surgeon's instructions about driving. You will probably not be permitted to drive after heart surgery. °· Take any medicines your surgeon prescribes. Any medicines you took before your heart surgery should be checked with your health care provider before you start taking them again. °Wound care °· If your surgeon has prescribed an elastic bandage or stocking, ask how long you should wear it. °· Check the area around your surgical  cuts (incisions) whenever your bandages (dressings) are changed. Look for any redness or swelling. °· You will need to return to have the stitches (sutures) or staples taken out. Ask your surgeon when to do that. °· Ask your surgeon when you can shower or bathe. °Activity °· Try to keep your legs raised when you are sitting. °· Do any exercises your health care providers have given you. These may include deep breathing exercises, coughing, walking, or other exercises. °SEEK MEDICAL CARE IF: °· You have any questions about your medicines. °· You have more leg pain, especially if your pain medicine stops working. °· New or growing bruises develop on your leg. °· Your leg swells, feels tight, or becomes red. °· You have numbness in your leg. °SEEK IMMEDIATE MEDICAL CARE IF: °· Your pain gets much worse. °· Blood or fluid leaks from any of the incisions. °· Your incisions become warm, swollen, or red. °· You have chest pain. °· You have trouble breathing. °· You have a fever. °· You have more pain near your leg incision. °MAKE SURE YOU: °· Understand these instructions. °· Will watch your condition. °· Will get help right away if you are not doing well or   get worse. °  °This information is not intended to replace advice given to you by your health care provider. Make sure you discuss any questions you have with your health care provider. °  °Document Released: 12/04/2010 Document Revised: 04/14/2014 Document Reviewed: 12/04/2010 °Elsevier Interactive Patient Education ©2016 [employer] Inc[employer] ° ° °

## 2015-08-31 NOTE — Discharge Summary (Signed)
Physician Discharge Summary  Patient ID: Justin Olson MRN: [id number] DOB/AGE: 07/03/1965 50 y.o.  Admit date: 08/28/2015 Discharge date: 09/05/2015  Admission Diagnoses:  Patient Active Problem List   Diagnosis Date Noted  . S/P CABG x 4   . S/P CABG x 2 08/31/2015  . CAD (coronary artery disease) 08/29/2015  . Dyslipidemia 08/29/2015  . Type II diabetes mellitus (HCC) 08/29/2015  . Unstable angina (HCC) 08/28/2015  . Tobacco abuse 08/28/2015  . Family history of early CAD 08/28/2015  . NSTEMI (non-ST elevated myocardial infarction) (HCC) 08/28/2015   Discharge Diagnoses:   Patient Active Problem List   Diagnosis Date Noted  . S/P CABG x 4   . S/P CABG x 2 08/31/2015  . CAD (coronary artery disease) 08/29/2015  . Dyslipidemia 08/29/2015  . Type II diabetes mellitus (HCC) 08/29/2015  . Unstable angina (HCC) 08/28/2015  . Tobacco abuse 08/28/2015  . Family history of early CAD 08/28/2015  . NSTEMI (non-ST elevated myocardial infarction) (HCC) 08/28/2015   Discharged Condition: good  History of Present Illness:  Justin Olson is a 50 yo male with known heavy tobacco abuse and family history of CAD.  He presented to OSH with complaints of chest heaviness with numbness of his left arm.  This was associated with diaphoresis, nausea, and shortness of breath with cough. He was ruled in for NSTEMI and transferred to Justin Olson for further care.  He was taken to the cardiac catheterization lab by Dr. Allyson Olson who found the patient to have severe LM disease.  He was placed on a Heparin and Nitroglycerin drip and TCTS consult was obtained.  Hospital Course:   He remained chest pain free during hospitalization.  He was evaluated by Justin Olson who was in agreement the patient would require coronary bypass grafting.  The risks and benefits of the procedure were explained to the patient and he was agreeable to proceed.  He was taken to the operating room on 08/30/2015.  He underwent CABG x  2 utilizing LIMA to LAD, and SVG to Ramus intermediate.  He also underwent endoscopic harvest of the greater saphenous vein from his right thigh.  He tolerated the procedure without difficulty and was taken to the SICU in stable condition.  He was extubated the evening of surgery.  During his stay in the SICU the patient was weaned off Neo Synephrine drip as tolerated.  His chest tubes and arterial lines were removed without difficulty.  He was maintaining NSR.          Significant Diagnostic Studies: angiography:   Dominance: Right   Left Main   . Ost LM lesion, 95% stenosed.     Treatments: surgery:   1. Coronary artery bypass grafting x2 (left internal mammary artery to LAD, saphenous vein graft to ramus intermediate). 2. Endoscopic harvest of right leg greater saphenous vein.  Disposition: Home  Discharge medications:      Discharge Instructions    Amb Referral to Cardiac Rehabilitation    Complete by:  As directed   Diagnosis:  CABG  CABG X ___:  2     Ambulatory referral to Nutrition and Diabetic Education    Complete by:  As directed   New onset. Post CABG. A1c 7.9            Medication List    TAKE these medications        aspirin 325 MG EC tablet  Take 1 tablet (325 mg total) by mouth daily.  atorvastatin 80 MG tablet  Commonly known as:  LIPITOR  Take 1 tablet (80 mg total) by mouth daily at 6 PM.     guaiFENesin 600 MG 12 hr tablet  Commonly known as:  MUCINEX  Take 1 tablet (600 mg total) by mouth every 12 (twelve) hours as needed for cough.     metFORMIN 500 MG tablet  Commonly known as:  GLUCOPHAGE  Take 1 tablet (500 mg total) by mouth daily with breakfast.     metoprolol tartrate 25 MG tablet  Commonly known as:  LOPRESSOR  Take 0.5 tablets (12.5 mg total) by mouth 2 (two) times daily.     neomycin-polymyxin b-dexamethasone 3.5-10000-0.1 Oint  Commonly known as:  MAXITROL  Place 1 application into both eyes 4 (four) times daily. For 8 days      nicotine 14 mg/24hr patch  Commonly known as:  NICODERM CQ - dosed in mg/24 hours  Place 1 patch (14 mg total) onto the skin daily.     oxyCODONE 5 MG immediate release tablet  Commonly known as:  Oxy IR/ROXICODONE  Take 1-2 tablets (5-10 mg total) by mouth every 4 (four) hours as needed for severe pain.      The patient has been discharged on:   1.Beta Blocker:  Yes [ x   ]                              No   [   ]                              If No, reason:  2.Ace Inhibitor/ARB: Yes [   ]                                     No  [  x  ]                                     If No, reason:Labile BP  3.Statin:   Yes [ x  ]                  No  [   ]                  If No, reason:  4.Ecasa:  Yes  [ x   ]                  No   [   ]                  If No, reason:   Follow-up Information    Follow up with Justin Bussing, MD On 10/03/2015.   Specialty:  Cardiothoracic Surgery   Why:  Appoointment is at 12:30   Contact information:   [address] [employer] [address] [place] [address] [phone]       Follow up with  [facility] On 10/03/2015.   Why:  Please get CXR at 12:00, located on first floor of our office building   Contact information:   [facility]       Follow up with Justin Batty, MD.   Specialties:  Cardiology, Radiology   Why:  If  you dont not receive an appointment, please contact to set up a hospital follow up   Contact information:   [address] [place] [address] [phone]       Follow up with Medical Doctor.   Why:  Please obtain a medical doctor for further diabetes management and surveillance of HGA1C 7.9      Follow up with [facility]   Why:  will deliver concentrator to your home, please contact   Contact information:   [address] [place] [address] [phone]       Signed: Ardelle Balls PA-C 09/05/2015, 1:39 PM

## 2015-08-31 NOTE — Plan of Care (Signed)
Problem: Cardiac: Goal: Hemodynamic stability will improve Outcome: Completed/Met Date Met:  08/31/15 Off Pressors and CI >2.0. SBP  110-120's. AAI 80 with SR 60's under pacer.  Problem: Respiratory: Goal: Levels of oxygenation will improve Outcome: Progressing Weaned from 6L to 3L but sats decreased to 89%. Bumped back up to 5L and sats are upper 90's. IS 500-7102m.  Problem: Pain Management: Goal: Pain level will decrease Outcome: Progressing Ketoralac added to  Pain regimen. Receiving morphine, oxycodone, ketorolac for pain control.

## 2015-09-01 ENCOUNTER — Inpatient Hospital Stay (HOSPITAL_COMMUNITY): Payer: BLUE CROSS/BLUE SHIELD

## 2015-09-01 DIAGNOSIS — Z951 Presence of aortocoronary bypass graft: Secondary | ICD-10-CM | POA: Insufficient documentation

## 2015-09-01 LAB — GLUCOSE, CAPILLARY
GLUCOSE-CAPILLARY: 136 mg/dL — AB (ref 65–99)
GLUCOSE-CAPILLARY: 170 mg/dL — AB (ref 65–99)
GLUCOSE-CAPILLARY: 147 mg/dL — AB (ref 65–99)
Glucose-Capillary: 147 mg/dL — ABNORMAL HIGH (ref 65–99)
Glucose-Capillary: 159 mg/dL — ABNORMAL HIGH (ref 65–99)
Glucose-Capillary: 87 mg/dL (ref 65–99)

## 2015-09-01 LAB — CBC
HCT: 35.2 % — ABNORMAL LOW (ref 39.0–52.0)
Hemoglobin: 11.3 g/dL — ABNORMAL LOW (ref 13.0–17.0)
MCH: 28.7 pg (ref 26.0–34.0)
MCHC: 32.1 g/dL (ref 30.0–36.0)
MCV: 89.3 fL (ref 78.0–100.0)
Platelets: 228 10*3/uL (ref 150–400)
RBC: 3.94 MIL/uL — ABNORMAL LOW (ref 4.22–5.81)
RDW: 13.7 % (ref 11.5–15.5)
WBC: 17.9 10*3/uL — ABNORMAL HIGH (ref 4.0–10.5)

## 2015-09-01 LAB — BASIC METABOLIC PANEL
Anion gap: 8 (ref 5–15)
BUN: 8 mg/dL (ref 6–20)
CO2: 24 mmol/L (ref 22–32)
Calcium: 8.7 mg/dL — ABNORMAL LOW (ref 8.9–10.3)
Chloride: 103 mmol/L (ref 101–111)
Creatinine, Ser: 0.79 mg/dL (ref 0.61–1.24)
GFR calc Af Amer: >60 mL/min (ref >60)
GFR calc non Af Amer: >60 mL/min (ref >60)
Glucose, Bld: 96 mg/dL (ref 65–99)
Potassium: 3.6 mmol/L (ref 3.5–5.1)
Sodium: 135 mmol/L (ref 135–145)

## 2015-09-01 MED ORDER — INSULIN ASPART 100 UNIT/ML ~~LOC~~ SOLN
0.0000 [IU] | Freq: Three times a day (TID) | SUBCUTANEOUS | Status: DC
  Administered 2015-09-01: 2 [IU] via SUBCUTANEOUS
  Administered 2015-09-01 (×2): 3 [IU] via SUBCUTANEOUS
  Administered 2015-09-02 – 2015-09-03 (×2): 2 [IU] via SUBCUTANEOUS
  Administered 2015-09-04: 3 [IU] via SUBCUTANEOUS
  Administered 2015-09-04: 2 [IU] via SUBCUTANEOUS
  Administered 2015-09-05: 1 [IU] via SUBCUTANEOUS

## 2015-09-01 MED ORDER — FUROSEMIDE 40 MG PO TABS
40.0000 mg | ORAL_TABLET | Freq: Every day | ORAL | Status: DC
  Administered 2015-09-01 – 2015-09-02 (×2): 40 mg via ORAL
  Filled 2015-09-01 (×2): qty 1

## 2015-09-01 MED ORDER — INSULIN DETEMIR 100 UNIT/ML ~~LOC~~ SOLN
12.0000 [IU] | Freq: Every day | SUBCUTANEOUS | Status: DC
  Administered 2015-09-01: 12 [IU] via SUBCUTANEOUS
  Filled 2015-09-01 (×2): qty 0.12

## 2015-09-01 MED ORDER — POTASSIUM CHLORIDE CRYS ER 20 MEQ PO TBCR
20.0000 meq | EXTENDED_RELEASE_TABLET | Freq: Two times a day (BID) | ORAL | Status: DC
  Administered 2015-09-01 – 2015-09-02 (×4): 20 meq via ORAL
  Filled 2015-09-01 (×4): qty 1

## 2015-09-01 MED ORDER — CETYLPYRIDINIUM CHLORIDE 0.05 % MT LIQD
7.0000 mL | Freq: Two times a day (BID) | OROMUCOSAL | Status: DC
  Administered 2015-09-01 – 2015-09-02 (×3): 7 mL via OROMUCOSAL

## 2015-09-01 MED ORDER — POTASSIUM CHLORIDE 10 MEQ/50ML IV SOLN
10.0000 meq | INTRAVENOUS | Status: AC
  Administered 2015-09-01 (×3): 10 meq via INTRAVENOUS
  Filled 2015-09-01 (×2): qty 50

## 2015-09-01 NOTE — Plan of Care (Signed)
Problem: Activity: Goal: Risk for activity intolerance will decrease Outcome: Progressing Pt is on track with activity progression plan  Problem: Respiratory: Goal: Levels of oxygenation will improve Outcome: Progressing Still needs oxygen

## 2015-09-01 NOTE — Progress Notes (Signed)
      [address]       [place] [id number]             [phone]      No complaints  BP 108/64 mmHg  Pulse 74  Temp(Src) 98.1 F (36.7 C) (Oral)  Resp 21  Ht 5\' 5"  (1.651 m)  Wt 193 lb 2 oz (87.6 kg)  BMI 32.14 kg/m2  SpO2 90% on 2 L [place]   Intake/Output Summary (Last 24 hours) at 09/01/15 1724 Last data filed at 09/01/15 1600  Gross per 24 hour  Intake   1400 ml  Output   2970 ml  Net  -1570 ml   CBG 136- 170  Binta Statzer C. Dorris FetchHendrickson, MD [facility] [phone]

## 2015-09-01 NOTE — Progress Notes (Signed)
2 Days Post-Op Procedure(s) (LRB): CORONARY ARTERY BYPASS GRAFTING (CABG) x2 using left internal mammary artery and right greater saphenous vein.  (N/A) TRANSESOPHAGEAL ECHOCARDIOGRAM (TEE) (N/A) Subjective: C/o incisional pain and pain from chest tube  Objective: Vital signs in last 24 hours: Temp:  [97.4 F (36.3 C)-98.4 F (36.9 C)] 98.3 F (36.8 C) (05/27 0732) Pulse Rate:  [65-80] 65 (05/27 0700) Cardiac Rhythm:  [-] Normal sinus rhythm (05/27 0600) Resp:  [10-23] 16 (05/27 0700) BP: (94-121)/(54-82) 103/62 mmHg (05/27 0500) SpO2:  [89 %-99 %] 94 % (05/27 0742) Arterial Line BP: (94-125)/(51-55) 117/55 mmHg (05/26 1500) Weight:  [193 lb 2 oz (87.6 kg)] 193 lb 2 oz (87.6 kg) (05/27 0500)  Hemodynamic parameters for last 24 hours: PAP: (25-36)/(12-21) 35/19 mmHg CO:  [5.9 L/min] 5.9 L/min CI:  [3.1 L/min/m2] 3.1 L/min/m2  Intake/Output from previous day: 05/26 0701 - 05/27 0700 In: 1668.1 [I.V.:608.1; IV Piggyback:700] Out: 2925 [XBJYN:8295[Urine:2465; Chest Tube:460] Intake/Output this shift:    General appearance: alert, cooperative and no distress Neurologic: intact Heart: regular rate and rhythm Lungs: diminished breath sounds bibasilar Abdomen: normal findings: soft, non-tender  Lab Results:  Recent Labs  08/31/15 1530 09/01/15 0408  WBC 17.7* 17.9*  HGB 11.2*  11.4* 11.3*  HCT 33.0*  34.4* 35.2*  PLT 198 228   BMET:  Recent Labs  08/31/15 0405 08/31/15 1530 09/01/15 0408  NA 138 137 135  K 3.9 3.6 3.6  CL 109 102 103  CO2 22  --  24  GLUCOSE 119* 122* 96  BUN 6 7 8   CREATININE 0.80 0.83  0.70 0.79  CALCIUM 8.5*  --  8.7*    PT/INR:  Recent Labs  08/30/15 1315  LABPROT 16.3*  INR 1.30   ABG    Component Value Date/Time   PHART 7.338* 08/30/2015 1838   HCO3 20.2 08/30/2015 1838   TCO2 21 08/31/2015 1530   ACIDBASEDEF 5.0* 08/30/2015 1838   O2SAT 89.0 08/30/2015 1838   CBG (last 3)   Recent Labs  08/31/15 1931 08/31/15 2330  09/01/15 0358  GLUCAP 168* 147* 87    Assessment/Plan: S/P Procedure(s) (LRB): CORONARY ARTERY BYPASS GRAFTING (CABG) x2 using left internal mammary artery and right greater saphenous vein.  (N/A) TRANSESOPHAGEAL ECHOCARDIOGRAM (TEE) (N/A) -  CV- POD # 1 CABG  In SR, BP ok  ASA, statin, beta blocker  RESP- continue IS  Dc pleural tube  RENAL- creatinine OK, change to PO lasix  ENDO- CBG down overnight, change levemir to daily, [facility]  Anemia- mild, stable- follow  SCD for DVT prophylaxis  mobilize   LOS: 4 days    Loreli SlotSteven C Latasia Silberstein 09/01/2015

## 2015-09-02 ENCOUNTER — Inpatient Hospital Stay (HOSPITAL_COMMUNITY): Payer: BLUE CROSS/BLUE SHIELD

## 2015-09-02 LAB — CBC
HEMATOCRIT: 34 % — AB (ref 39.0–52.0)
Hemoglobin: 11.1 g/dL — ABNORMAL LOW (ref 13.0–17.0)
MCH: 29.4 pg (ref 26.0–34.0)
MCHC: 32.6 g/dL (ref 30.0–36.0)
MCV: 89.9 fL (ref 78.0–100.0)
Platelets: 237 10*3/uL (ref 150–400)
RBC: 3.78 MIL/uL — ABNORMAL LOW (ref 4.22–5.81)
RDW: 13.7 % (ref 11.5–15.5)
WBC: 16.4 10*3/uL — ABNORMAL HIGH (ref 4.0–10.5)

## 2015-09-02 LAB — BASIC METABOLIC PANEL
Anion gap: 6 (ref 5–15)
BUN: 10 mg/dL (ref 6–20)
CALCIUM: 8.8 mg/dL — AB (ref 8.9–10.3)
CO2: 24 mmol/L (ref 22–32)
CREATININE: 0.84 mg/dL (ref 0.61–1.24)
Chloride: 104 mmol/L (ref 101–111)
GFR calc Af Amer: >60 mL/min (ref >60)
GFR calc non Af Amer: >60 mL/min (ref >60)
GLUCOSE: 107 mg/dL — AB (ref 65–99)
Potassium: 4.7 mmol/L (ref 3.5–5.1)
Sodium: 134 mmol/L — ABNORMAL LOW (ref 135–145)

## 2015-09-02 LAB — GLUCOSE, CAPILLARY
Glucose-Capillary: 105 mg/dL — ABNORMAL HIGH (ref 65–99)
Glucose-Capillary: 142 mg/dL — ABNORMAL HIGH (ref 65–99)
Glucose-Capillary: 96 mg/dL (ref 65–99)
Glucose-Capillary: 99 mg/dL (ref 65–99)

## 2015-09-02 MED ORDER — SODIUM CHLORIDE 0.9% FLUSH: 3.0000 mL | INTRAVENOUS | Status: DC | PRN

## 2015-09-02 MED ORDER — METOPROLOL SUCCINATE ER 25 MG PO TB24
25.0000 mg | ORAL_TABLET | Freq: Every day | ORAL | Status: DC
  Administered 2015-09-02 – 2015-09-04 (×3): 25 mg via ORAL
  Filled 2015-09-02 (×3): qty 1

## 2015-09-02 MED ORDER — SODIUM CHLORIDE 0.9% FLUSH
3.0000 mL | Freq: Two times a day (BID) | INTRAVENOUS | Status: DC
  Administered 2015-09-02 – 2015-09-05 (×5): 3 mL via INTRAVENOUS

## 2015-09-02 MED ORDER — ALUM & MAG HYDROXIDE-SIMETH 200-200-20 MG/5ML PO SUSP: 15.0000 mL | ORAL | Status: DC | PRN

## 2015-09-02 MED ORDER — GUAIFENESIN ER 600 MG PO TB12
600.0000 mg | ORAL_TABLET | Freq: Two times a day (BID) | ORAL | Status: DC | PRN
  Filled 2015-09-02: qty 1

## 2015-09-02 MED ORDER — MOVING RIGHT ALONG BOOK
Freq: Once | Status: AC
  Filled 2015-09-02: qty 1

## 2015-09-02 MED ORDER — ZOLPIDEM TARTRATE 5 MG PO TABS
5.0000 mg | ORAL_TABLET | Freq: Every evening | ORAL | Status: DC | PRN
  Administered 2015-09-02: 5 mg via ORAL
  Filled 2015-09-02 (×2): qty 1

## 2015-09-02 MED ORDER — SODIUM CHLORIDE 0.9 % IV SOLN: 250.0000 mL | INTRAVENOUS | Status: DC | PRN

## 2015-09-02 MED ORDER — MAGNESIUM HYDROXIDE 400 MG/5ML PO SUSP: 30.0000 mL | Freq: Every day | ORAL | Status: DC | PRN

## 2015-09-02 NOTE — Progress Notes (Signed)
3 Days Post-Op Procedure(s) (LRB): CORONARY ARTERY BYPASS GRAFTING (CABG) x2 using left internal mammary artery and right greater saphenous vein.  (N/A) TRANSESOPHAGEAL ECHOCARDIOGRAM (TEE) (N/A) Subjective: Feels better today  Objective: Vital signs in last 24 hours: Temp:  [98.1 F (36.7 C)-98.6 F (37 C)] 98.6 F (37 C) (05/28 0733) Pulse Rate:  [55-81] 57 (05/28 0700) Cardiac Rhythm:  [-] Sinus bradycardia (05/27 2300) Resp:  [10-22] 14 (05/28 0700) BP: (93-123)/(55-77) 108/68 mmHg (05/28 0700) SpO2:  [90 %-96 %] 90 % (05/28 0740) Weight:  [190 lb 4.1 oz (86.3 kg)] 190 lb 4.1 oz (86.3 kg) (05/28 0600)  Hemodynamic parameters for last 24 hours:    Intake/Output from previous day: 05/27 0701 - 05/28 0700 In: 1270 [P.O.:1020; I.V.:100; IV Piggyback:150] Out: 2645 [Urine:2595; Chest Tube:50] Intake/Output this shift:    General appearance: alert, cooperative and no distress Neurologic: intact Heart: regular rate and rhythm Lungs: diminished breath sounds bibasilar and rhonchi bilaterally Abdomen: normal findings: soft, non-tender  Lab Results:  Recent Labs  09/01/15 0408 09/02/15 0309  WBC 17.9* 16.4*  HGB 11.3* 11.1*  HCT 35.2* 34.0*  PLT 228 237   BMET:  Recent Labs  09/01/15 0408 09/02/15 0309  NA 135 134*  K 3.6 4.7  CL 103 104  CO2 24 24  GLUCOSE 96 107*  BUN 8 10  CREATININE 0.79 0.84  CALCIUM 8.7* 8.8*    PT/INR:  Recent Labs  08/30/15 1315  LABPROT 16.3*  INR 1.30   ABG    Component Value Date/Time   PHART 7.338* 08/30/2015 1838   HCO3 20.2 08/30/2015 1838   TCO2 21 08/31/2015 1530   ACIDBASEDEF 5.0* 08/30/2015 1838   O2SAT 89.0 08/30/2015 1838   CBG (last 3)   Recent Labs  09/01/15 1124 09/01/15 1608 09/01/15 2130  GLUCAP 136* 170* 147*    Assessment/Plan: S/P Procedure(s) (LRB): CORONARY ARTERY BYPASS GRAFTING (CABG) x2 using left internal mammary artery and right greater saphenous vein.  (N/A) TRANSESOPHAGEAL  ECHOCARDIOGRAM (TEE) (N/A) Plan for transfer to step-down: see transfer orders  CV- stable  RESP_ COPD, has rhonchi bilaterally but no wheezing this AM  Continue nebs, IS  RENAL- diuresing well, creatinine and lytes OK  ENDO- CBG well controlled  Continue ambulation  Transfer to 2W   LOS: 5 days    Loreli SlotSteven C Hendrickson 09/02/2015

## 2015-09-03 ENCOUNTER — Inpatient Hospital Stay (HOSPITAL_COMMUNITY): Payer: BLUE CROSS/BLUE SHIELD

## 2015-09-03 DIAGNOSIS — Z951 Presence of aortocoronary bypass graft: Secondary | ICD-10-CM

## 2015-09-03 LAB — BASIC METABOLIC PANEL
Anion gap: 9 (ref 5–15)
BUN: 11 mg/dL (ref 6–20)
CALCIUM: 9.4 mg/dL (ref 8.9–10.3)
CO2: 29 mmol/L (ref 22–32)
CREATININE: 0.84 mg/dL (ref 0.61–1.24)
Chloride: 99 mmol/L — ABNORMAL LOW (ref 101–111)
GFR calc Af Amer: >60 mL/min (ref >60)
GLUCOSE: 123 mg/dL — AB (ref 65–99)
POTASSIUM: 4.3 mmol/L (ref 3.5–5.1)
SODIUM: 137 mmol/L (ref 135–145)

## 2015-09-03 LAB — CBC
HCT: 36.6 % — ABNORMAL LOW (ref 39.0–52.0)
Hemoglobin: 12 g/dL — ABNORMAL LOW (ref 13.0–17.0)
MCH: 29.3 pg (ref 26.0–34.0)
MCHC: 32.8 g/dL (ref 30.0–36.0)
MCV: 89.3 fL (ref 78.0–100.0)
PLATELETS: 341 10*3/uL (ref 150–400)
RBC: 4.1 MIL/uL — AB (ref 4.22–5.81)
RDW: 13.5 % (ref 11.5–15.5)
WBC: 16.4 10*3/uL — ABNORMAL HIGH (ref 4.0–10.5)

## 2015-09-03 LAB — GLUCOSE, CAPILLARY
GLUCOSE-CAPILLARY: 107 mg/dL — AB (ref 65–99)
GLUCOSE-CAPILLARY: 176 mg/dL — AB (ref 65–99)
Glucose-Capillary: 140 mg/dL — ABNORMAL HIGH (ref 65–99)
Glucose-Capillary: 157 mg/dL — ABNORMAL HIGH (ref 65–99)

## 2015-09-03 MED ORDER — LEVALBUTEROL HCL 1.25 MG/0.5ML IN NEBU: 1.2500 mg | INHALATION_SOLUTION | Freq: Four times a day (QID) | RESPIRATORY_TRACT | Status: DC | PRN

## 2015-09-03 MED ORDER — FUROSEMIDE 40 MG PO TABS
40.0000 mg | ORAL_TABLET | Freq: Every day | ORAL | Status: AC
  Administered 2015-09-03: 40 mg via ORAL
  Filled 2015-09-03: qty 1

## 2015-09-03 MED ORDER — POTASSIUM CHLORIDE CRYS ER 20 MEQ PO TBCR
20.0000 meq | EXTENDED_RELEASE_TABLET | Freq: Every day | ORAL | Status: AC
  Administered 2015-09-03: 20 meq via ORAL
  Filled 2015-09-03: qty 1

## 2015-09-03 MED ORDER — METFORMIN HCL 500 MG PO TABS
500.0000 mg | ORAL_TABLET | Freq: Every day | ORAL | Status: DC
Start: 2015-09-04 — End: 2015-09-05
  Administered 2015-09-04 – 2015-09-05 (×2): 500 mg via ORAL
  Filled 2015-09-03 (×2): qty 1

## 2015-09-03 NOTE — Progress Notes (Addendum)
      [address]       [place] [id number]             [phone]        4 Days Post-Op Procedure(s) (LRB): CORONARY ARTERY BYPASS GRAFTING (CABG) x2 using left internal mammary artery and right greater saphenous vein.  (N/A) TRANSESOPHAGEAL ECHOCARDIOGRAM (TEE) (N/A)  Subjective: Patient without complaints this am.  Objective: Vital signs in last 24 hours: Temp:  [98.1 F (36.7 C)-98.5 F (36.9 C)] 98.1 F (36.7 C) (05/29 0504) Pulse Rate:  [61-83] 75 (05/29 0710) Cardiac Rhythm:  [-] Normal sinus rhythm (05/28 1944) Resp:  [14-18] 18 (05/29 0710) BP: (109-118)/(62-75) 115/68 mmHg (05/29 0504) SpO2:  [89 %-96 %] 93 % (05/29 0710) Weight:  [182 lb 11.2 oz (82.872 kg)] 182 lb 11.2 oz (82.872 kg) (05/29 0504)  Pre op weight 85 kg Current Weight  09/03/15 182 lb 11.2 oz (82.872 kg)      Intake/Output from previous day: 05/28 0701 - 05/29 0700 In: 300 [P.O.:300] Out: 4000 [Urine:4000]   Physical Exam:  Cardiovascular: RRR Pulmonary: Slightly diminished at bases L>R Abdomen: Soft, non tender, bowel sounds present. Extremities: No lower extremity edema. Wounds: Clean and dry.  No erythema or signs of infection.  Lab Results: CBC: Recent Labs  09/02/15 0309 09/03/15 0217  WBC 16.4* 16.4*  HGB 11.1* 12.0*  HCT 34.0* 36.6*  PLT 237 341   BMET:  Recent Labs  09/02/15 0309 09/03/15 0217  NA 134* 137  K 4.7 4.3  CL 104 99*  CO2 24 29  GLUCOSE 107* 123*  BUN 10 11  CREATININE 0.84 0.84  CALCIUM 8.8* 9.4    PT/INR:  Lab Results  Component Value Date   INR 1.30 08/30/2015   INR 1.02 08/28/2015   ABG:  INR: Will add last result for INR, ABG once components are confirmed Will add last 4 CBG results once components are confirmed  Assessment/Plan:  1. CV - SR in the 70's. On Toprol XL 25 mg daily 2.  Pulmonary - On 3 liters of oxygen via . Wean to room air as tolerates. He may need home oxygen at discharge. Continue Xopenex,  Pulmicort. CXR this am shows no pneumothorax, left base atelectasisEncourage incentive spirometer and flutter valve.  3. Volume Overload - On Lasix 40 mg daily. No lower extremity edema and at or below pre op weight. Will stop after this am dose. 4.  Acute blood loss anemia - H and H stable at 12 and 36.6 5. DM-CBGs 96/99/107. On Insulin. Pre op HGA1C 7.9. Will start Metformin and stop Insulin. Will need follow up with medical doctor after discharge. 6. Remove EPW 7. Possibly home in 1-2 days if oxygen requirements are decreased  ZIMMERMAN,DONIELLE MPA-C 09/03/2015,7:35 AM  Patient seen and examined, agree with above Continue pulmonary Rx  Viviann SpareSteven C. Dorris FetchHendrickson, MD [facility] [phone]

## 2015-09-03 NOTE — Progress Notes (Signed)
Subjective:  No CP, mild SOB, POD # 4 CABG X2 for LM Dz.  Objective:  Temp:  [98.1 F (36.7 C)-98.5 F (36.9 C)] 98.1 F (36.7 C) (05/29 0504) Pulse Rate:  [64-83] 75 (05/29 0710) Resp:  [14-18] 18 (05/29 0710) BP: (109-118)/(62-75) 115/68 mmHg (05/29 0504) SpO2:  [89 %-96 %] 93 % (05/29 0710) Weight:  [182 lb 11.2 oz (82.872 kg)] 182 lb 11.2 oz (82.872 kg) (05/29 0504) Weight change: -7 lb 8.9 oz (-3.428 kg)  Intake/Output from previous day: 05/28 0701 - 05/29 0700 In: 300 [P.O.:300] Out: 4000 [Urine:4000]  Intake/Output from this shift:    Physical Exam: General appearance: alert and no distress Neck: no adenopathy, no carotid bruit, no JVD, supple, symmetrical, trachea midline and thyroid not enlarged, symmetric, no tenderness/mass/nodules Lungs: Dectreased BS L base Heart: regular rate and rhythm, S1, S2 normal, no murmur, click, rub or gallop Extremities: extremities normal, atraumatic, no cyanosis or edema  Lab Results: Results for orders placed or performed during the hospital encounter of 08/28/15 (from the past 48 hour(s))  Glucose, capillary     Status: Abnormal   Collection Time: 09/01/15 11:24 AM  Result Value Ref Range   Glucose-Capillary 136 (H) 65 - 99 mg/dL   Comment 1 Capillary Specimen    Comment 2 Notify RN   Glucose, capillary     Status: Abnormal   Collection Time: 09/01/15  4:08 PM  Result Value Ref Range   Glucose-Capillary 170 (H) 65 - 99 mg/dL   Comment 1 Capillary Specimen    Comment 2 Notify RN   Glucose, capillary     Status: Abnormal   Collection Time: 09/01/15  9:30 PM  Result Value Ref Range   Glucose-Capillary 147 (H) 65 - 99 mg/dL   Comment 1 Capillary Specimen    Comment 2 Notify RN   CBC     Status: Abnormal   Collection Time: 09/02/15  3:09 AM  Result Value Ref Range   WBC 16.4 (H) 4.0 - 10.5 K/uL   RBC 3.78 (L) 4.22 - 5.81 MIL/uL   Hemoglobin 11.1 (L) 13.0 - 17.0 g/dL   HCT 34.0 (L) 39.0 - 52.0 %   MCV 89.9 78.0 -  100.0 fL   MCH 29.4 26.0 - 34.0 pg   MCHC 32.6 30.0 - 36.0 g/dL   RDW 13.7 11.5 - 15.5 %   Platelets 237 150 - 400 K/uL  Basic metabolic panel     Status: Abnormal   Collection Time: 09/02/15  3:09 AM  Result Value Ref Range   Sodium 134 (L) 135 - 145 mmol/L   Potassium 4.7 3.5 - 5.1 mmol/L    Comment: DELTA CHECK NOTED   Chloride 104 101 - 111 mmol/L   CO2 24 22 - 32 mmol/L   Glucose, Bld 107 (H) 65 - 99 mg/dL   BUN 10 6 - 20 mg/dL   Creatinine, Ser 0.84 0.61 - 1.24 mg/dL   Calcium 8.8 (L) 8.9 - 10.3 mg/dL   GFR calc non Af Amer >60 >60 mL/min   GFR calc Af Amer >60 >60 mL/min    Comment: (NOTE) The eGFR has been calculated using the CKD EPI equation. This calculation has not been validated in all clinical situations. eGFR's persistently <60 mL/min signify possible Chronic Kidney Disease.    Anion gap 6 5 - 15  Glucose, capillary     Status: Abnormal   Collection Time: 09/02/15  7:31 AM  Result Value Ref Range  Glucose-Capillary 105 (H) 65 - 99 mg/dL   Comment 1 Capillary Specimen    Comment 2 Notify RN   Glucose, capillary     Status: Abnormal   Collection Time: 09/02/15 11:51 AM  Result Value Ref Range   Glucose-Capillary 142 (H) 65 - 99 mg/dL   Comment 1 Capillary Specimen    Comment 2 Notify RN   Glucose, capillary     Status: None   Collection Time: 09/02/15  4:30 PM  Result Value Ref Range   Glucose-Capillary 96 65 - 99 mg/dL  Glucose, capillary     Status: None   Collection Time: 09/02/15  9:26 PM  Result Value Ref Range   Glucose-Capillary 99 65 - 99 mg/dL  CBC     Status: Abnormal   Collection Time: 09/03/15  2:17 AM  Result Value Ref Range   WBC 16.4 (H) 4.0 - 10.5 K/uL   RBC 4.10 (L) 4.22 - 5.81 MIL/uL   Hemoglobin 12.0 (L) 13.0 - 17.0 g/dL   HCT 36.6 (L) 39.0 - 52.0 %   MCV 89.3 78.0 - 100.0 fL   MCH 29.3 26.0 - 34.0 pg   MCHC 32.8 30.0 - 36.0 g/dL   RDW 13.5 11.5 - 15.5 %   Platelets 341 150 - 400 K/uL  Basic metabolic panel     Status:  Abnormal   Collection Time: 09/03/15  2:17 AM  Result Value Ref Range   Sodium 137 135 - 145 mmol/L   Potassium 4.3 3.5 - 5.1 mmol/L   Chloride 99 (L) 101 - 111 mmol/L   CO2 29 22 - 32 mmol/L   Glucose, Bld 123 (H) 65 - 99 mg/dL   BUN 11 6 - 20 mg/dL   Creatinine, Ser 0.84 0.61 - 1.24 mg/dL   Calcium 9.4 8.9 - 10.3 mg/dL   GFR calc non Af Amer >60 >60 mL/min   GFR calc Af Amer >60 >60 mL/min    Comment: (NOTE) The eGFR has been calculated using the CKD EPI equation. This calculation has not been validated in all clinical situations. eGFR's persistently <60 mL/min signify possible Chronic Kidney Disease.    Anion gap 9 5 - 15  Glucose, capillary     Status: Abnormal   Collection Time: 09/03/15  6:11 AM  Result Value Ref Range   Glucose-Capillary 107 (H) 65 - 99 mg/dL    Imaging: Imaging results have been reviewed, Infiltrate L base (I have personally reviewed)  Tele- NSR  Assessment/Plan:   1. Principal Problem: 2.   NSTEMI (non-ST elevated myocardial infarction) ([place]) 3. Active Problems: 4.   Tobacco abuse 5.   Family history of early CAD 60.   CAD (coronary artery disease) 7.   Dyslipidemia 8.   Type II diabetes mellitus ([place]) 9.   S/P CABG x 2 10.   S/P CABG x 4 11.   Time Spent Directly with Patient:  20 minutes  Length of Stay:  LOS: 6 days   POD #4 CABG X2 for LM Dz. LIMA---> LAD and SVG---> RI. Nl LV FXN. Nl progression. I/O neg 6 L. No periph edema. Weaning O2. Labs OK. On approp meds. Still has a LLL infiltrate. Ambulate with [facility]. Home next 1-2 days. F/U with me as OP.   Quay Burow 09/03/2015, 8:17 AM

## 2015-09-03 NOTE — Progress Notes (Signed)
EPW removed. Patient tolerated well. Tips intact. Patient educated on resting in bed, vitals Q15 minutes. Call bell within reach. Wife at bedside. Call bell within reach.   Valinda HoarLexie Lemar Bakos RN

## 2015-09-04 LAB — GLUCOSE, CAPILLARY
GLUCOSE-CAPILLARY: 171 mg/dL — AB (ref 65–99)
GLUCOSE-CAPILLARY: 95 mg/dL (ref 65–99)
Glucose-Capillary: 105 mg/dL — ABNORMAL HIGH (ref 65–99)
Glucose-Capillary: 138 mg/dL — ABNORMAL HIGH (ref 65–99)

## 2015-09-04 MED ORDER — NEOMYCIN-POLYMYXIN-DEXAMETH 3.5-10000-0.1 OP OINT
TOPICAL_OINTMENT | Freq: Four times a day (QID) | OPHTHALMIC | Status: DC
  Administered 2015-09-04 – 2015-09-05 (×3): via OPHTHALMIC
  Filled 2015-09-04: qty 3.5

## 2015-09-04 NOTE — Progress Notes (Addendum)
      [address]       [place] [id number]             [phone]        5 Days Post-Op Procedure(s) (LRB): CORONARY ARTERY BYPASS GRAFTING (CABG) x2 using left internal mammary artery and right greater saphenous vein.  (N/A) TRANSESOPHAGEAL ECHOCARDIOGRAM (TEE) (N/A)  Subjective: Patient without complaints this am.  Objective: Vital signs in last 24 hours: Temp:  [97.9 F (36.6 C)-98.3 F (36.8 C)] 98.3 F (36.8 C) (05/30 0430) Pulse Rate:  [55-82] 60 (05/30 0708) Cardiac Rhythm:  [-] Sinus bradycardia;Other (Comment) (05/30 0448) Resp:  [16] 16 (05/30 0708) BP: (102-118)/(55-73) 109/73 mmHg (05/30 0458) SpO2:  [90 %-97 %] 91 % (05/30 0708) Weight:  [177 lb (80.287 kg)] 177 lb (80.287 kg) (05/30 0430)  Pre op weight 85 kg Current Weight  09/04/15 177 lb (80.287 kg)      Intake/Output from previous day: 05/29 0701 - 05/30 0700 In: 720 [P.O.:720] Out: 3325 [Urine:3325]   Physical Exam:  Cardiovascular: RRR Pulmonary: Slightly diminished at bases L>R Abdomen: Soft, non tender, bowel sounds present. Extremities: No lower extremity edema. Wounds: Clean and dry.  No erythema or signs of infection.  Lab Results: CBC:  Recent Labs  09/02/15 0309 09/03/15 0217  WBC 16.4* 16.4*  HGB 11.1* 12.0*  HCT 34.0* 36.6*  PLT 237 341   BMET:   Recent Labs  09/02/15 0309 09/03/15 0217  NA 134* 137  K 4.7 4.3  CL 104 99*  CO2 24 29  GLUCOSE 107* 123*  BUN 10 11  CREATININE 0.84 0.84  CALCIUM 8.8* 9.4    PT/INR:  Lab Results  Component Value Date   INR 1.30 08/30/2015   INR 1.02 08/28/2015   ABG:  INR: Will add last result for INR, ABG once components are confirmed Will add last 4 CBG results once components are confirmed  Assessment/Plan:  1. CV - SR in the 60's. On Toprol XL 25 mg daily. Monitor HR 2.  Pulmonary - On 1 liter of oxygen via [place]. Wean to room air as tolerates. He may need home oxygen at discharge. Continue Xopenex,  Pulmicort. Encourage incentive spirometer and flutter valve.  3.  Acute blood loss anemia - H and H stable at 12 and 36.6 4. DM-CBGs 176/157/171. On Insulin. Pre op HGA1C 7.9. Will start Metformin and stop Insulin. Will need follow up with medical doctor after discharge. 5. Likely discharge in am  ZIMMERMAN,DONIELLE MPA-C 09/04/2015,7:30 AM  Repeat CXR in am hopefully on room air patient examined and medical record reviewed,agree with above note. Kathlee Nationseter Van Trigt III 09/04/2015

## 2015-09-04 NOTE — Progress Notes (Signed)
CARDIAC REHAB PHASE I   PRE:  Rate/Rhythm: 61 SR    BP: sitting 106/70    SaO2: 93 1L, 92 RA  MODE:  Ambulation: 500 ft   POST:  Rate/Rhythm: 76 SR    BP: sitting 110/70     SaO2: 91 1L  Pt moving well, able to stand independently and walk with min assist (no RW). Initially ambulated on RA with SaO2 88-90 RA. However after 300 ft pt SaO2 to 85 RA therefore applied 1L. Maintained 91 1L rest of walk. Return to recliner. Encouraged continued use of IS/flutter and x2 more walks today with wife on O2. Will f/u in am to reaccess O2 needs. If pt needs O2 tonight while asleep, please document in progress notes.  [id number]  Harriet MassonRandi Kristan Bryce Kimble CES, ACSM 09/04/2015 12:53 PM

## 2015-09-04 NOTE — Progress Notes (Signed)
     SUBJECTIVE: Dizziness last night. No SOB. Chest wall is sore.   Tele: sinus  BP 109/73 mmHg  Pulse 60  Temp(Src) 98.3 F (36.8 C) (Oral)  Resp 16  Ht 5\' 5"  (1.651 m)  Wt 177 lb (80.287 kg)  BMI 29.45 kg/m2  SpO2 91%  Intake/Output Summary (Last 24 hours) at 09/04/15 0753 Last data filed at 09/04/15 0432  Gross per 24 hour  Intake    720 ml  Output   3325 ml  Net  -2605 ml    PHYSICAL EXAM General: Well developed, well nourished, in no acute distress. Alert and oriented x 3.  Psych:  Good affect, responds appropriately Neck: No JVD. No masses noted.  Lungs: Clear bilaterally with no wheezes or rhonci noted.  Heart: RRR with no murmurs noted. Abdomen: Bowel sounds are present. Soft, non-tender.  Extremities: No lower extremity edema.   LABS: Basic Metabolic Panel:  Recent Labs  16/01/9604/28/17 0309 09/03/15 0217  NA 134* 137  K 4.7 4.3  CL 104 99*  CO2 24 29  GLUCOSE 107* 123*  BUN 10 11  CREATININE 0.84 0.84  CALCIUM 8.8* 9.4   CBC:  Recent Labs  09/02/15 0309 09/03/15 0217  WBC 16.4* 16.4*  HGB 11.1* 12.0*  HCT 34.0* 36.6*  MCV 89.9 89.3  PLT 237 341   Current Meds: . acetaminophen  1,000 mg Oral Q6H   Or  . acetaminophen (TYLENOL) oral liquid 160 mg/5 mL  1,000 mg Per Tube Q6H  . aspirin EC  325 mg Oral Daily   Or  . aspirin  324 mg Per Tube Daily  . atorvastatin  80 mg Oral q1800  . bisacodyl  10 mg Oral Daily   Or  . bisacodyl  10 mg Rectal Daily  . budesonide (PULMICORT) nebulizer solution  0.25 mg Nebulization BID  . docusate sodium  200 mg Oral Daily  . guaiFENesin  600 mg Oral BID  . insulin aspart  0-15 Units Subcutaneous TID WC  . metFORMIN  500 mg Oral Q breakfast  . metoprolol succinate  25 mg Oral Daily  . nicotine  14 mg Transdermal Daily  . pantoprazole  40 mg Oral Daily  . sodium chloride flush  3 mL Intravenous Q12H     ASSESSMENT AND PLAN:  1. CAD with unstable angina: Now s/p 2V CABG. Doing well. BP stable.  Sinus  rhythm. He is on ASA, statin and beta blocker. Likely home tomorrow.   Verne Carrowhristopher Taro Hidrogo  5/30/20177:53 AM

## 2015-09-05 ENCOUNTER — Inpatient Hospital Stay (HOSPITAL_COMMUNITY): Payer: BLUE CROSS/BLUE SHIELD

## 2015-09-05 LAB — GLUCOSE, CAPILLARY
GLUCOSE-CAPILLARY: 123 mg/dL — AB (ref 65–99)
Glucose-Capillary: 97 mg/dL (ref 65–99)

## 2015-09-05 MED ORDER — NEOMYCIN-POLYMYXIN-DEXAMETH 3.5-10000-0.1 OP OINT: 1.0000 "application " | TOPICAL_OINTMENT | Freq: Four times a day (QID) | OPHTHALMIC | Status: DC

## 2015-09-05 MED ORDER — METOPROLOL TARTRATE 25 MG PO TABS
12.5000 mg | ORAL_TABLET | Freq: Two times a day (BID) | ORAL | Status: DC
Start: 2015-09-05 — End: 2015-09-25

## 2015-09-05 MED ORDER — METFORMIN HCL 500 MG PO TABS: 500.0000 mg | ORAL_TABLET | Freq: Every day | ORAL | Status: DC

## 2015-09-05 MED ORDER — NICOTINE 14 MG/24HR TD PT24: 14.0000 mg | MEDICATED_PATCH | Freq: Every day | TRANSDERMAL | Status: DC

## 2015-09-05 MED ORDER — ASPIRIN 325 MG PO TBEC: 325.0000 mg | DELAYED_RELEASE_TABLET | Freq: Every day | ORAL | Status: DC

## 2015-09-05 MED ORDER — GUAIFENESIN ER 600 MG PO TB12
600.0000 mg | ORAL_TABLET | Freq: Two times a day (BID) | ORAL | Status: DC | PRN
Start: 2015-09-05 — End: 2015-10-03

## 2015-09-05 MED ORDER — METOPROLOL TARTRATE 12.5 MG HALF TABLET
12.5000 mg | ORAL_TABLET | Freq: Two times a day (BID) | ORAL | Status: DC
  Administered 2015-09-05: 12.5 mg via ORAL
  Filled 2015-09-05: qty 1

## 2015-09-05 MED ORDER — OXYCODONE HCL 5 MG PO TABS: 5.0000 mg | ORAL_TABLET | ORAL | Status: DC | PRN

## 2015-09-05 MED ORDER — ATORVASTATIN CALCIUM 80 MG PO TABS: 80.0000 mg | ORAL_TABLET | Freq: Every day | ORAL | Status: AC

## 2015-09-05 MED ORDER — LIVING WELL WITH DIABETES BOOK
Freq: Once | Status: AC
  Administered 2015-09-05: 10:00:00
  Filled 2015-09-05: qty 1

## 2015-09-05 NOTE — Progress Notes (Signed)
SATURATION QUALIFICATIONS: (This note is used to comply with regulatory documentation for home oxygen)  Patient Saturations on Room Air at Rest = 87-88% Placed pt on 1 L [place], sat 92%  Recent CABG x2. Ordered oxygen with [facility]. Explained to pt that his physician will continue to follow up outpatient for oxygen needs. Isidoro DonningAlesia Robecca Fulgham RN CCM Case Mgmt phone [phone]

## 2015-09-05 NOTE — Progress Notes (Addendum)
      [address]       [place] [id number]             [phone]        6 Days Post-Op Procedure(s) (LRB): CORONARY ARTERY BYPASS GRAFTING (CABG) x2 using left internal mammary artery and right greater saphenous vein.  (N/A) TRANSESOPHAGEAL ECHOCARDIOGRAM (TEE) (N/A)  Subjective: Patient without complaints this am.  Objective: Vital signs in last 24 hours: Temp:  [98.4 F (36.9 C)] 98.4 F (36.9 C) (05/31 0436) Pulse Rate:  [57-61] 61 (05/31 0436) Cardiac Rhythm:  [-] Normal sinus rhythm (05/30 2000) Resp:  [18-20] 20 (05/31 0436) BP: (100-105)/(54-64) 105/64 mmHg (05/31 0436) SpO2:  [91 %-94 %] 91 % (05/31 0436) Weight:  [180 lb 5.4 oz (81.8 kg)] 180 lb 5.4 oz (81.8 kg) (05/31 0436)  Pre op weight 85 kg Current Weight  09/05/15 180 lb 5.4 oz (81.8 kg)      Intake/Output from previous day: 05/30 0701 - 05/31 0700 In: -  Out: 1600 [Urine:1600]   Physical Exam:  Cardiovascular: RRR Pulmonary: Slightly diminished at bases L>R Abdomen: Soft, non tender, bowel sounds present. Extremities: No lower extremity edema. Wounds: Clean and dry.  No erythema or signs of infection.  Lab Results: CBC:  Recent Labs  09/03/15 0217  WBC 16.4*  HGB 12.0*  HCT 36.6*  PLT 341   BMET:   Recent Labs  09/03/15 0217  NA 137  K 4.3  CL 99*  CO2 29  GLUCOSE 123*  BUN 11  CREATININE 0.84  CALCIUM 9.4    PT/INR:  Lab Results  Component Value Date   INR 1.30 08/30/2015   INR 1.02 08/28/2015   ABG:  INR: Will add last result for INR, ABG once components are confirmed Will add last 4 CBG results once components are confirmed  Assessment/Plan:  1. CV - SR in the 60's. On Toprol XL 25 mg daily. With SBP in the low 100's, will change to Lopressor 12.5 mg bid with parameters. 2.  Pulmonary - On 1 liter of oxygen via [place].He will need home oxygen at discharge. Continue Xopenex, Pulmicort. Encourage incentive spirometer and flutter valve. Per Dr. Donata ClayVan  Trigt, check CXR so will order CXR this am. 3.  Acute blood loss anemia - H and H stable at 12 and 36.6 4. DM-CBGs 138/95/123. On Insulin. Pre op HGA1C 7.9. Will start Metformin and stop Insulin. Will need follow up with medical doctor after discharge. 5. Discharge this afternoon if CXR stable.   Jakeb Lamping MPA-C 09/05/2015,7:32 AM

## 2015-09-05 NOTE — Progress Notes (Signed)
     SUBJECTIVE: No SOB but states that he still needs supplemental O2.   Tele: sinus  BP 105/64 mmHg  Pulse 61  Temp(Src) 98.4 F (36.9 C) (Oral)  Resp 20  Ht 5\' 5"  (1.651 m)  Wt 180 lb 5.4 oz (81.8 kg)  BMI 30.01 kg/m2  SpO2 91%  Intake/Output Summary (Last 24 hours) at 09/05/15 [id number] Last data filed at 09/05/15 0440  Gross per 24 hour  Intake      0 ml  Output   1600 ml  Net  -1600 ml    PHYSICAL EXAM General: Well developed, well nourished, in no acute distress. Alert and oriented x 3.  Psych:  Good affect, responds appropriately Neck: No JVD. No masses noted.  Lungs: Clear bilaterally with no wheezes or rhonci noted.  Heart: RRR with no murmurs noted. Abdomen: Bowel sounds are present. Soft, non-tender.  Extremities: No lower extremity edema.   LABS: Basic Metabolic Panel:  Recent Labs  13/24/4005/29/17 0217  NA 137  K 4.3  CL 99*  CO2 29  GLUCOSE 123*  BUN 11  CREATININE 0.84  CALCIUM 9.4   CBC:  Recent Labs  09/03/15 0217  WBC 16.4*  HGB 12.0*  HCT 36.6*  MCV 89.3  PLT 341   Current Meds: . aspirin EC  325 mg Oral Daily   Or  . aspirin  324 mg Per Tube Daily  . atorvastatin  80 mg Oral q1800  . bisacodyl  10 mg Oral Daily   Or  . bisacodyl  10 mg Rectal Daily  . budesonide (PULMICORT) nebulizer solution  0.25 mg Nebulization BID  . docusate sodium  200 mg Oral Daily  . guaiFENesin  600 mg Oral BID  . insulin aspart  0-15 Units Subcutaneous TID WC  . metFORMIN  500 mg Oral Q breakfast  . metoprolol succinate  25 mg Oral Daily  . neomycin-polymyxin b-dexamethasone   Both Eyes QID  . nicotine  14 mg Transdermal Daily  . pantoprazole  40 mg Oral Daily  . sodium chloride flush  3 mL Intravenous Q12H     ASSESSMENT AND PLAN:  1. CAD with unstable angina: Now s/p 2V CABG. Doing well. BP stable. Sinus rhythm. He is on ASA, statin and beta blocker. Per pt, O2 sats dropping with ambulation. Lungs with faint basilar crackles but this may be  atelectasis. Would get chest x-ray this am as he has no other signs of volume overload. Likely home today.  Justin CarrowChristopher Leshon Olson  5/31/20177:02 AM

## 2015-09-05 NOTE — Progress Notes (Signed)
Inpatient Diabetes Program Recommendations  AACE/ADA: New Consensus Statement on Inpatient Glycemic Control (2015)  Target Ranges:  Prepandial:   less than 140 mg/dL      Peak postprandial:   less than 180 mg/dL (1-2 hours)      Critically ill patients:  140 - 180 mg/dL   Review of Glycemic Control Inpatient Diabetes Program Recommendations:  Noted patient is new onset DM and plans to discharge home on Metformin. Patient will need prescription  For meter and strips # [id number] for discharge. Requested dietary consult, [facility] and patient and wife have been watching diabetes videos. Reviewed with patient and wife basic plate method and reviewed A1c and significance for increased associated risks.  Thank you, Justin FischerJudy E. Chae Shuster, RN, MSN, CDE Inpatient Glycemic Control Team Team Pager [phone] (8am-5pm) 09/05/2015 10:13 AM

## 2015-09-05 NOTE — Progress Notes (Signed)
CARDIAC REHAB PHASE I   PRE:  Rate/Rhythm: 59 SB    BP: sitting 92/50    SaO2: 91 1/2L  MODE:  Ambulation: 790 ft   POST:  Rate/Rhythm: 76 SR    BP: sitting 100/60     SaO2: 90 RA  Pt on 1/2L this am. Able to walk long distance with lower SaO2 88 RA. Denies SOB, feels good. Pt did wear O2 1L last night with SaO2 documented as 91 1L and 94 1L. Discussed ed with pt and wife. Good reception. Ready to quit smoking and care for new DM. Gave fake cigarette and resources. Pt has been watching DM videos and ready book. He is interested in [facility] and will send referral to [facility]. [id number]   Harriet MassonRandi Kristan Ki Luckman CES, ACSM 09/05/2015 11:45 AM

## 2015-09-05 NOTE — Plan of Care (Signed)
Problem: Food- and Nutrition-Related Knowledge Deficit (NB-1.1) Goal: Nutrition education Formal process to instruct or train a patient/client in a skill or to impart knowledge to help patients/clients voluntarily manage or modify food choices and eating behavior to maintain or improve health. Outcome: Completed/Met Date Met:  09/05/15  RD consulted for nutrition education regarding diabetes.     Lab Results  Component Value Date    HGBA1C 7.9* 08/29/2015    RD provided "Carbohydrate Counting for People with Diabetes" handout from the Academy of Nutrition and Dietetics. Discussed different food groups and their effects on blood sugar, emphasizing carbohydrate-containing foods. Provided list of carbohydrates and recommended serving sizes of common foods.  Discussed importance of controlled and consistent carbohydrate intake throughout the day. Provided examples of ways to balance meals/snacks and encouraged intake of high-fiber, whole grain complex carbohydrates. Teach back method used.  Expect good compliance. Body mass index is 30.01 kg/(m^2). Pt meets criteria for Obesity Class I based on current BMI.  Current diet order is Heart Healthy/Carbohydrate Modified. Labs and medications reviewed. No further nutrition interventions warranted at this time. RD contact information provided. If additional nutrition issues arise, please re-consult RD.  Arthur Holms, RD, LDN Pager #: [phone] After-Hours Pager #: [phone]

## 2015-09-05 NOTE — Care Management Note (Addendum)
Case Management Note  Patient Details  Name: Justin Olson MRN: [id number] Date of Birth: 1966-01-10  Subjective/Objective:    CABG x2                Action/Plan: Discharge Planning:   PCP- none  NCM spoke pt and wife at bedside. Explained to pt that he can contact toll free number on his insurance card or go to the [employer] website for list of providers. Wife states she tried to get an appt with her PCP but no accepting new patients. Contacted [facility] DME rep for new oxygen for home. Explained to pt that [facility] will bring a portable to his room and concentrator to the home when he is dc. Wife will have surgeon's office complete his FMLA.  1545 Pt's wife arranged appt with PA, [name] at [name] for 09/13/2015 at 1045 am.   Expected Discharge Date:  09/05/2015             Expected Discharge Plan:  Home/Self Care  In-House Referral:  N/A  Discharge planning Services  Case Management   DME Arranged:  oxygen [facility]ME [facility]   Status of Service:  complete  Medicare Important Message Given:    Date Medicare IM Given:    Medicare IM give by:    Date Additional Medicare IM Given:    Additional Medicare Important Message give by:     If discussed at Long Length of Stay Meetings, dates discussed:    Additional Comments:  [name], [name], RN 09/05/2015, 12:23 PM

## 2015-09-06 ENCOUNTER — Other Ambulatory Visit: Payer: Self-pay | Admitting: *Deleted

## 2015-09-06 DIAGNOSIS — E119 Type 2 diabetes mellitus without complications: Secondary | ICD-10-CM

## 2015-09-06 DIAGNOSIS — I25110 Atherosclerotic heart disease of native coronary artery with unstable angina pectoris: Secondary | ICD-10-CM | POA: Diagnosis not present

## 2015-09-06 MED ORDER — FREESTYLE LANCETS MISC: Status: DC

## 2015-09-06 MED ORDER — GLUCOSE BLOOD VI STRP: ORAL_STRIP | Status: DC

## 2015-09-06 NOTE — Progress Notes (Signed)
I called in a prescription to [facility] ([address]) for glucometer and testing strips as patient with newly diagnosed diabetes. His wife did say that patient has an appointment to see a PA next Thursday regarding diabetes. I left a message on patient's cell phone [phone] to pick up glucometer and testing strips TODAY.

## 2015-09-07 ENCOUNTER — Other Ambulatory Visit: Payer: Self-pay | Admitting: *Deleted

## 2015-09-07 DIAGNOSIS — B370 Candidal stomatitis: Secondary | ICD-10-CM

## 2015-09-07 DIAGNOSIS — E119 Type 2 diabetes mellitus without complications: Secondary | ICD-10-CM

## 2015-09-07 MED ORDER — MAGIC MOUTHWASH: 5.0000 mL | Freq: Four times a day (QID) | ORAL | Status: DC

## 2015-09-07 MED ORDER — BAYER CONTOUR MONITOR DEVI: 1.0000 | Freq: Two times a day (BID) | Status: AC

## 2015-09-07 MED ORDER — BAYER CONTOUR MONITOR DEVI: 1.0000 | Freq: Two times a day (BID) | Status: DC

## 2015-09-07 NOTE — Progress Notes (Signed)
Mrs. Justin Olson has called to relate that [name], s/p CABG, has a red tongue and feel it's due to the oxygen he is on. I called and talked to both her and her husband. He also says his tongue is very sore.  I explained that it is probably thrush and the rational for this and that MAGIC MOUTHWASH is needed. I said that I would call a prescription to their pharmacy and instructed in it's usage.  They understood.

## 2015-09-10 ENCOUNTER — Other Ambulatory Visit: Payer: Self-pay | Admitting: *Deleted

## 2015-09-10 DIAGNOSIS — B370 Candidal stomatitis: Secondary | ICD-10-CM

## 2015-09-10 MED ORDER — MAGIC MOUTHWASH: 5.0000 mL | Freq: Four times a day (QID) | ORAL | Status: DC

## 2015-09-13 ENCOUNTER — Other Ambulatory Visit: Payer: Self-pay

## 2015-09-13 DIAGNOSIS — G8918 Other acute postprocedural pain: Secondary | ICD-10-CM

## 2015-09-13 DIAGNOSIS — E1165 Type 2 diabetes mellitus with hyperglycemia: Secondary | ICD-10-CM | POA: Diagnosis not present

## 2015-09-13 DIAGNOSIS — Z09 Encounter for follow-up examination after completed treatment for conditions other than malignant neoplasm: Secondary | ICD-10-CM | POA: Diagnosis not present

## 2015-09-13 MED ORDER — OXYCODONE HCL 5 MG PO TABS: 5.0000 mg | ORAL_TABLET | ORAL | Status: DC | PRN

## 2015-09-13 NOTE — Telephone Encounter (Signed)
RX refill for Oxycodone printed out. Patient's wife will pkup at front desk.

## 2015-09-25 ENCOUNTER — Ambulatory Visit (INDEPENDENT_AMBULATORY_CARE_PROVIDER_SITE_OTHER): Payer: BLUE CROSS/BLUE SHIELD | Admitting: Physician Assistant

## 2015-09-25 ENCOUNTER — Encounter: Payer: Self-pay | Admitting: Physician Assistant

## 2015-09-25 ENCOUNTER — Encounter: Payer: Self-pay | Admitting: Cardiovascular Disease

## 2015-09-25 VITALS — BP 104/62 | HR 56 | Ht 65.0 in | Wt 177.2 lb

## 2015-09-25 DIAGNOSIS — Z720 Tobacco use: Secondary | ICD-10-CM | POA: Diagnosis not present

## 2015-09-25 DIAGNOSIS — R9431 Abnormal electrocardiogram [ECG] [EKG]: Secondary | ICD-10-CM

## 2015-09-25 DIAGNOSIS — E785 Hyperlipidemia, unspecified: Secondary | ICD-10-CM

## 2015-09-25 DIAGNOSIS — E1169 Type 2 diabetes mellitus with other specified complication: Secondary | ICD-10-CM | POA: Diagnosis not present

## 2015-09-25 DIAGNOSIS — I214 Non-ST elevation (NSTEMI) myocardial infarction: Secondary | ICD-10-CM | POA: Diagnosis not present

## 2015-09-25 MED ORDER — CLOPIDOGREL BISULFATE 75 MG PO TABS: 75.0000 mg | ORAL_TABLET | Freq: Every day | ORAL | Status: DC

## 2015-09-25 NOTE — Progress Notes (Signed)
Cardiology Office Note   Date:  09/25/2015   ID:  Justin Olson, DOB 10-Jan-1966, MRN [id number]  PCP:  Redmond Baseman, MD  Cardiologist:  Dr William Hamburger, PA-C   History of Present Illness: Justin Olson is a 50 y.o. male with a history of heavy tob use, FH CAD, admitted 05/23 w/ NSTEMI>>CABG, dx w/ DM and dyslipidemia, EF 50-55% by echo. D/c 05/31   Justin Olson presents for post-hospital f/u.  Since d/c from the hospital, he feels his heart is fine. He has had no chest pain with exertion. He was placed on oxygen prior to d/c from the hospital, desaturations were worse at night. He is increasing his activity and doing pretty well with this. He has no lower extremity edema, and denies orthopnea or PND. He has quit smoking and is wearing the patch. He has not been wheezing.  His major complaint of pain as a burning pain on the left side of his chest only. This pain is continuous. It does not change with deep inspiration, position change such as sitting up or lying down. It does not change with cough. The skin is tender to palpation. If it were not for this burning pain, he would not need any pain pills at all.  He is being compliant with his medications. He wants to know when he can have sex and he wants to know when he can drive.   Past Medical History  Diagnosis Date  . Tobacco abuse   . Diabetes mellitus type 2, noninsulin dependent (HCC) 08/2015  . Dyslipidemia associated with type 2 diabetes mellitus (HCC) 08/2015    Past Surgical History  Procedure Laterality Date  . Cardiac catheterization N/A 08/28/2015    Procedure: Left Heart Cath and Coronary Angiography;  Surgeon: Runell Gess, MD;  Location: [facility] INVASIVE CV LAB;  Service: Cardiovascular;  Laterality: N/A;  . Coronary artery bypass graft N/A 08/30/2015    Procedure: CORONARY ARTERY BYPASS GRAFTING (CABG) x2 using left internal mammary artery and right greater saphenous vein. ;  Surgeon: Kerin Perna,  MD;  LIMA-LAD, SVG-RI  . Tee without cardioversion N/A 08/30/2015    Procedure: TRANSESOPHAGEAL ECHOCARDIOGRAM (TEE);  Surgeon: Kerin Perna, MD;  Location: [facility] OR;  Service: Open Heart Surgery;  Laterality: N/A;    Current Outpatient Prescriptions  Medication Sig Dispense Refill  . aspirin EC 325 MG EC tablet Take 1 tablet (325 mg total) by mouth daily. 30 tablet 0  . atorvastatin (LIPITOR) 80 MG tablet Take 1 tablet (80 mg total) by mouth daily at 6 PM. 30 tablet 1  . Blood Glucose Monitoring Suppl (CONTOUR BLOOD GLUCOSE SYSTEM) DEVI Inject 1 Device as directed 2 (two) times daily. 1 Device 0  . glucose blood (FREESTYLE LITE) test strip Test once daily before breakfast 50 each 12  . guaiFENesin (MUCINEX) 600 MG 12 hr tablet Take 1 tablet (600 mg total) by mouth every 12 (twelve) hours as needed for cough.    . Lancets (FREESTYLE) lancets Use as instructed 100 each 12  . magic mouthwash SOLN Take 5 mLs by mouth 4 (four) times daily. Swish and swallow four times daily 200 mL 0  . metFORMIN (GLUCOPHAGE) 500 MG tablet Take 1 tablet (500 mg total) by mouth daily with breakfast. 30 tablet 1  . metoprolol tartrate (LOPRESSOR) 25 MG tablet Take 0.5 tablets (12.5 mg total) by mouth 2 (two) times daily. 30 tablet 1  . neomycin-polymyxin b-dexamethasone (MAXITROL) 3.5-10000-0.1  OINT Place 1 application into both eyes 4 (four) times daily. For 8 days 3.5 g 0  . nicotine (NICODERM CQ - DOSED IN MG/24 HOURS) 14 mg/24hr patch Place 1 patch (14 mg total) onto the skin daily. 28 patch 0  . oxyCODONE (OXY IR/ROXICODONE) 5 MG immediate release tablet Take 1-2 tablets (5-10 mg total) by mouth every 4 (four) hours as needed for severe pain. 40 tablet 0   No current facility-administered medications for this visit.    Allergies:   Review of patient's allergies indicates no known allergies.    Social History:  The patient  reports that he quit smoking about 4 weeks ago. His smoking use included Cigarettes. He  smoked 1.50 packs per day. He does not have any smokeless tobacco history on file. He reports that he drinks alcohol. He reports that he does not use illicit drugs.   Family History:  The patient's family history includes Coronary artery disease in his father and sister.    ROS:  Please see the history of present illness. All other systems are reviewed and negative.    PHYSICAL EXAM: VS:  BP 104/62 mmHg  Pulse 56  Ht 5\' 5"  (1.651 m)  Wt 177 lb 3.2 oz (80.377 kg)  BMI 29.49 kg/m2 , BMI Body mass index is 29.49 kg/(m^2). GEN: Well nourished, well developed, male in no acute distress HEENT: normal for age  Neck: no JVD, no carotid bruit, no masses Cardiac: RRR; soft murmur, no rubs, or gallops Respiratory:  Few Scattered rales bilaterally, normal work of breathing GI: soft, nontender, nondistended, + BS MS: no deformity or atrophy; no edema; distal pulses are 2+ in all 4 extremities  Skin: warm and dry, no rash; incisions are healing well with no signs or symptoms of infection. Neuro:  Strength and sensation are intact Psych: euthymic mood, full affect   EKG:  EKG is ordered today. The ekg ordered today demonstrates sinus bradycardia, rate 56. Deep T-wave inversions in lead 1 and aVL are new from previous ECGs. He has diffuse mild ST elevation in the inferior and lateral leads, leads 2, 3, and aVF as well as V3-V6   Recent Labs: 08/29/2015: ALT 19; TSH 2.224 08/31/2015: Magnesium 2.0 09/03/2015: BUN 11; Creatinine, Ser 0.84; Hemoglobin 12.0*; Platelets 341; Potassium 4.3; Sodium 137    Lipid Panel    Component Value Date/Time   CHOL 189 08/29/2015 0210   TRIG 175* 08/29/2015 0210   HDL 30* 08/29/2015 0210   CHOLHDL 6.3 08/29/2015 0210   VLDL 35 08/29/2015 0210   LDLCALC 124* 08/29/2015 0210     Wt Readings from Last 3 Encounters:  09/25/15 177 lb 3.2 oz (80.377 kg)  09/05/15 180 lb 5.4 oz (81.8 kg)     Other studies Reviewed: Additional studies/ records that were  reviewed today include: Hospital records and testing.  ASSESSMENT AND PLAN:  1.  CAD: He had bypass surgery after he was admitted with a non-STEMI. His ECG is abnormal and different from his previous ECG, done the day after his bypass surgery. This pain is atypical for Dressler's syndrome and his activity level is increasing. However, he is still having the chest pain and his ECG is significantly abnormal. This was reviewed with Dr. Herbie BaltimoreHarding. We will get an echocardiogram and a Myoview to make sure he has not had early graft closure or developed an effusion. We will add Plavix to his medication regimen and decrease his aspirin to 81 mg daily. He is tolerating the  sinus bradycardia with a heart rate in the 50s cinnamon changes to his beta blocker which is already at a low dose.  2. Hyperlipidemia: Continue statin and he will need a lipid profile and liver function testing in 3 months.  3. Tobacco use: Continued cessation encouraged.  4. Oxygen use: He is not wheezing and having no significant abnormal lung sounds on exam. He was ambulated in the office and his oxygen saturation stayed good off O2. He states oxygen saturations were worse at night. He is encouraged to continue O2 daily at bedtime but does not need to use it during the day.   Current medicines are reviewed at length with the patient today.  The patient does not have concerns regarding medicines.  The following changes have been made:  Decrease and add Plavix with a low-dose load, 2 tablets today and tomorrow  Labs/ tests ordered today include:   Orders Placed This Encounter  Procedures  . Myocardial Perfusion Imaging  . EKG 12-Lead  . ECHOCARDIOGRAM COMPLETE     Disposition:   FU with Dr. Allyson Sabal  Signed, Theodore Demark, PA-C  09/25/2015 10:24 AM    [facility] Phone: [phone]; Fax: [phone]  This note was written with the assistance of speech recognition software. Please excuse any  transcriptional errors.

## 2015-09-25 NOTE — Patient Instructions (Addendum)
Medication Instructions:  DECREASE Aspirin 81 mg daily and START Plavix 75 mg take 2 tablets today and tomorrow then 1 tablet daily  Labwork: NONE  Testing/Procedures: Your physician has requested that you have an echocardiogram. Echocardiography is a painless test that uses sound waves to create images of your heart. It provides your doctor with information about the size and shape of your heart and how well your heart's chambers and valves are working. This procedure takes approximately one hour. There are no restrictions for this procedure.  Your physician has requested that you have a lexiscan myoview. For further information please visit [email or web address]. Please follow instruction sheet, as given.  Follow-Up: Your physician wants you to follow-up in: 6 Months with Dr Allyson SabalBerry. You will receive a reminder letter in the mail two months in advance. If you don't receive a letter, please call our office to schedule the follow-up appointment.   Any Other Special Instructions Will Be Listed Below (If Applicable).   If you need a refill on your cardiac medications before your next appointment, please call your pharmacy.

## 2015-09-26 ENCOUNTER — Encounter: Payer: Self-pay | Admitting: *Deleted

## 2015-09-26 ENCOUNTER — Encounter: Payer: BLUE CROSS/BLUE SHIELD | Attending: Family Medicine | Admitting: *Deleted

## 2015-09-26 VITALS — Ht 66.0 in | Wt 175.8 lb

## 2015-09-26 DIAGNOSIS — E119 Type 2 diabetes mellitus without complications: Secondary | ICD-10-CM

## 2015-09-26 NOTE — Patient Instructions (Signed)
Plan: Aim for 3-4 Carb Choices per meal (45-60 grams)  Aim for 0-2 Carbs per snack if hungry  Include protein in moderation with your meals and snacks Consider reading food labels for Total Carbohydrate, sodium, and Fat Grams of foods Consider replacing saturated fat with unsaturated fats. Consider increasing your activity level as approved by your doctor. Consider checking BG at alternate times per day as directed by MD  Continue taking medication as directed by MD

## 2015-09-26 NOTE — Progress Notes (Signed)
Diabetes Self-Management Education  Visit Type: First/Initial  Appt. Start Time: 1030 Appt. End Time: 1200  09/26/2015  [name], identified by name and date of birth, is a 50 y.o. male with a diagnosis of Diabetes: Type 2.   ASSESSMENT  Height 5\' 6"  (1.676 m), weight 175 lb 12.8 oz (79.742 kg). Body mass index is 28.39 kg/(m^2).      Diabetes Self-Management Education - 09/26/15 1045    Visit Information   Visit Type First/Initial      Individualized Plan for Diabetes Self-Management Training:   Learning Objective:  Patient will have a greater understanding of diabetes self-management. Patient education plan is to attend individual and/or group sessions per assessed needs and concerns.   Plan:   Patient Instructions  Plan: Aim for 3-4 Carb Choices per meal (45-60 grams)  Aim for 0-2 Carbs per snack if hungry  Include protein in moderation with your meals and snacks Consider reading food labels for Total Carbohydrate, sodium, and Fat Grams of foods Consider replacing saturated fat with unsaturated fats. Consider increasing your activity level as approved by your doctor. Consider checking BG at alternate times per day as directed by MD  Continue taking medication as directed by MD      Expected Outcomes:  Demonstrated interest in learning. Expect positive outcomes  Education material provided: Living Well with Diabetes, A1C conversion sheet, Meal plan card, My Plate and Snack sheet  If problems or questions, patient to contact team via:  Phone  Future DSME appointment: PRN

## 2015-09-28 ENCOUNTER — Other Ambulatory Visit: Payer: Self-pay

## 2015-09-28 ENCOUNTER — Telehealth (HOSPITAL_COMMUNITY): Payer: Self-pay

## 2015-09-28 DIAGNOSIS — G8918 Other acute postprocedural pain: Secondary | ICD-10-CM

## 2015-09-28 MED ORDER — TRAMADOL HCL 50 MG PO TABS: 100.0000 mg | ORAL_TABLET | Freq: Four times a day (QID) | ORAL | Status: DC | PRN

## 2015-09-28 NOTE — Telephone Encounter (Signed)
RX for Tramadol faxed to [facility]

## 2015-09-28 NOTE — Telephone Encounter (Signed)
Encounter complete. 

## 2015-10-01 ENCOUNTER — Other Ambulatory Visit: Payer: Self-pay | Admitting: Physician Assistant

## 2015-10-02 ENCOUNTER — Other Ambulatory Visit: Payer: Self-pay | Admitting: Cardiothoracic Surgery

## 2015-10-02 DIAGNOSIS — Z951 Presence of aortocoronary bypass graft: Secondary | ICD-10-CM

## 2015-10-03 ENCOUNTER — Encounter: Payer: Self-pay | Admitting: Cardiothoracic Surgery

## 2015-10-03 ENCOUNTER — Ambulatory Visit (HOSPITAL_COMMUNITY)
Admission: RE | Admit: 2015-10-03 | Discharge: 2015-10-03 | Disposition: A | Discharge status: Home / Self Care | Payer: BLUE CROSS/BLUE SHIELD | Source: Ambulatory Visit | Attending: Cardiovascular Disease | Admitting: Cardiovascular Disease

## 2015-10-03 ENCOUNTER — Ambulatory Visit
Admission: RE | Admit: 2015-10-03 | Discharge: 2015-10-03 | Disposition: A | Discharge status: Home / Self Care | Payer: BLUE CROSS/BLUE SHIELD | Source: Ambulatory Visit | Attending: Cardiothoracic Surgery | Admitting: Cardiothoracic Surgery

## 2015-10-03 ENCOUNTER — Ambulatory Visit (INDEPENDENT_AMBULATORY_CARE_PROVIDER_SITE_OTHER): Payer: Self-pay | Admitting: Cardiothoracic Surgery

## 2015-10-03 VITALS — BP 103/68 | HR 60 | Resp 20 | Ht 65.0 in | Wt 177.0 lb

## 2015-10-03 DIAGNOSIS — R9431 Abnormal electrocardiogram [ECG] [EKG]: Secondary | ICD-10-CM | POA: Diagnosis not present

## 2015-10-03 DIAGNOSIS — E119 Type 2 diabetes mellitus without complications: Secondary | ICD-10-CM | POA: Diagnosis not present

## 2015-10-03 DIAGNOSIS — R079 Chest pain, unspecified: Secondary | ICD-10-CM | POA: Diagnosis not present

## 2015-10-03 DIAGNOSIS — Z8249 Family history of ischemic heart disease and other diseases of the circulatory system: Secondary | ICD-10-CM | POA: Diagnosis not present

## 2015-10-03 DIAGNOSIS — Z951 Presence of aortocoronary bypass graft: Secondary | ICD-10-CM

## 2015-10-03 DIAGNOSIS — Z720 Tobacco use: Secondary | ICD-10-CM | POA: Insufficient documentation

## 2015-10-03 DIAGNOSIS — I214 Non-ST elevation (NSTEMI) myocardial infarction: Secondary | ICD-10-CM | POA: Insufficient documentation

## 2015-10-03 DIAGNOSIS — I517 Cardiomegaly: Secondary | ICD-10-CM | POA: Diagnosis not present

## 2015-10-03 LAB — MYOCARDIAL PERFUSION IMAGING
CHL CUP NUCLEAR SDS: 1
CHL CUP RESTING HR STRESS: 48 {beats}/min
LV dias vol: 137 mL (ref 62–150)
LV sys vol: 75 mL
Peak HR: 73 {beats}/min
SRS: 2
SSS: 3
TID: 1.23

## 2015-10-03 MED ORDER — REGADENOSON 0.4 MG/5ML IV SOLN: 0.4000 mg | Freq: Once | INTRAVENOUS | Status: DC

## 2015-10-03 MED ORDER — TECHNETIUM TC 99M TETROFOSMIN IV KIT
10.4000 | PACK | Freq: Once | INTRAVENOUS | Status: DC | PRN
Start: 2015-10-03 — End: 2015-10-04
  Filled 2015-10-03: qty 10

## 2015-10-03 MED ORDER — TECHNETIUM TC 99M TETROFOSMIN IV KIT
28.9000 | PACK | Freq: Once | INTRAVENOUS | Status: DC | PRN
  Filled 2015-10-03: qty 29

## 2015-10-03 MED ORDER — AMINOPHYLLINE 25 MG/ML IV SOLN: 75.0000 mg | Freq: Once | INTRAVENOUS | Status: DC

## 2015-10-04 ENCOUNTER — Encounter: Payer: Self-pay | Admitting: *Deleted

## 2015-10-04 NOTE — Progress Notes (Signed)
Patient ID: Justin Olson, male   DOB: 07/03/1965, 50 y.o.   MRN: [id number] A referral was faxed to [facility] to contact [name] to begin Phase II per the request of Dr. Donata ClayVan Trigt.

## 2015-10-06 DIAGNOSIS — I25110 Atherosclerotic heart disease of native coronary artery with unstable angina pectoris: Secondary | ICD-10-CM | POA: Diagnosis not present

## 2015-10-17 ENCOUNTER — Other Ambulatory Visit: Payer: Self-pay | Admitting: *Deleted

## 2015-10-17 ENCOUNTER — Other Ambulatory Visit: Payer: Self-pay

## 2015-10-17 ENCOUNTER — Ambulatory Visit (HOSPITAL_COMMUNITY): Payer: BLUE CROSS/BLUE SHIELD | Attending: Cardiology

## 2015-10-17 DIAGNOSIS — Z87891 Personal history of nicotine dependence: Secondary | ICD-10-CM | POA: Diagnosis not present

## 2015-10-17 DIAGNOSIS — G8928 Other chronic postprocedural pain: Secondary | ICD-10-CM

## 2015-10-17 DIAGNOSIS — E785 Hyperlipidemia, unspecified: Secondary | ICD-10-CM | POA: Diagnosis not present

## 2015-10-17 DIAGNOSIS — I214 Non-ST elevation (NSTEMI) myocardial infarction: Secondary | ICD-10-CM | POA: Insufficient documentation

## 2015-10-17 DIAGNOSIS — I371 Nonrheumatic pulmonary valve insufficiency: Secondary | ICD-10-CM | POA: Insufficient documentation

## 2015-10-17 DIAGNOSIS — I2510 Atherosclerotic heart disease of native coronary artery without angina pectoris: Secondary | ICD-10-CM | POA: Diagnosis not present

## 2015-10-17 DIAGNOSIS — I517 Cardiomegaly: Secondary | ICD-10-CM | POA: Diagnosis not present

## 2015-10-17 DIAGNOSIS — R9431 Abnormal electrocardiogram [ECG] [EKG]: Secondary | ICD-10-CM | POA: Diagnosis not present

## 2015-10-17 DIAGNOSIS — Z8249 Family history of ischemic heart disease and other diseases of the circulatory system: Secondary | ICD-10-CM | POA: Diagnosis not present

## 2015-10-17 DIAGNOSIS — M792 Neuralgia and neuritis, unspecified: Secondary | ICD-10-CM

## 2015-10-17 DIAGNOSIS — Z951 Presence of aortocoronary bypass graft: Secondary | ICD-10-CM | POA: Diagnosis not present

## 2015-10-17 MED ORDER — HYDROCODONE-ACETAMINOPHEN 5-325 MG PO TABS: 1.0000 | ORAL_TABLET | Freq: Four times a day (QID) | ORAL | Status: DC | PRN

## 2015-10-17 MED ORDER — GABAPENTIN 300 MG PO CAPS: 300.0000 mg | ORAL_CAPSULE | Freq: Two times a day (BID) | ORAL | Status: DC

## 2015-10-23 ENCOUNTER — Telehealth: Payer: Self-pay | Admitting: Cardiovascular Disease

## 2015-10-23 NOTE — Telephone Encounter (Signed)
F/U    Pt wife returning nurse call for test results. Please call.

## 2015-10-23 NOTE — Telephone Encounter (Signed)
Follow-up ° ° ° °The pt is returning the nurses phone call °

## 2015-10-23 NOTE — Telephone Encounter (Signed)
Returned the call to Misty StanleyLisa, pts wife, DPR on file, has been made aware of pts echo results and verbalized understanding.

## 2015-10-23 NOTE — Telephone Encounter (Signed)
-----   Message from Little IshikawaErin E Smith, NP sent at 10/22/2015  4:12 PM EDT ----- Please tell Mr. Entsminger that his Echo showed improved heart function.

## 2015-10-23 NOTE — Telephone Encounter (Signed)
-----   Message from Erin E Smith, NP sent at 10/22/2015  4:12 PM EDT ----- Please tell Mr. Ehmke that his Echo showed improved heart function. 

## 2015-10-23 NOTE — Telephone Encounter (Signed)
Returned pts call; and had to leave a message for him to return the call.

## 2015-10-29 ENCOUNTER — Other Ambulatory Visit: Payer: Self-pay | Admitting: Physician Assistant

## 2015-10-29 ENCOUNTER — Other Ambulatory Visit: Payer: Self-pay | Admitting: *Deleted

## 2015-10-29 ENCOUNTER — Telehealth: Payer: Self-pay | Admitting: Anesthesiology

## 2015-10-29 NOTE — Telephone Encounter (Signed)
Patient need ok to have tooth extracted-please fax to Dr.Tran @ [facility] at [phone] call back  [phone].

## 2015-10-30 NOTE — Progress Notes (Signed)
PCP is Redmond Baseman, MD Referring Provider is Runell Gess, MD  Chief Complaint  Patient presents with  . Routine Post Op    f/u from surgery with CXR s/p Coronary artery bypass grafting x2, 08/30/2015, had stress test this am     [id number] postop visit after urgent CABG 2 for left main stenosis. The patient presented with unstable angina and positive cardiac enzymes consistent with non-ST elevation MI. Patient underwent cardiac catheterization showing severe left main stenosis and surgical coronary revascularization was  Recommended. Patient underwent left IMA grafting to LAD and saphenous vein graft to ramus intermediate. The patient had an uneventful postoperative hospital recovery. He remained sinus rhythm. He continues to do well at home without recurrent angina. Surgical incisions are healing well. States he had a stress test which was normal by his cardiologist  Chest x-ray taken today is clear without pleural effusion, stable cardiac silhouette    Past Medical History:  Diagnosis Date  . Diabetes mellitus type 2, noninsulin dependent (HCC) 08/2015  . Dyslipidemia associated with type 2 diabetes mellitus (HCC) 08/2015  . Tobacco abuse     Past Surgical History:  Procedure Laterality Date  . CARDIAC CATHETERIZATION N/A 08/28/2015   Procedure: Left Heart Cath and Coronary Angiography;  Surgeon: Runell Gess, MD;  Location: [facility] INVASIVE CV LAB;  Service: Cardiovascular;  Laterality: N/A;  . CORONARY ARTERY BYPASS GRAFT N/A 08/30/2015   Procedure: CORONARY ARTERY BYPASS GRAFTING (CABG) x2 using left internal mammary artery and right greater saphenous vein. ;  Surgeon: Kerin Perna, MD;  LIMA-LAD, SVG-RI  . TEE WITHOUT CARDIOVERSION N/A 08/30/2015   Procedure: TRANSESOPHAGEAL ECHOCARDIOGRAM (TEE);  Surgeon: Kerin Perna, MD;  Location: [facility] OR;  Service: Open Heart Surgery;  Laterality: N/A;    Family History  Problem Relation Age of Onset  . Coronary artery  disease Father     MI  . Coronary artery disease Sister     s/p CABG x5    Social History Social History  Substance Use Topics  . Smoking status: Former Smoker    Packs/day: 1.50    Types: Cigarettes    Quit date: 08/28/2015  . Smokeless tobacco: Not on file  . Alcohol use 0.0 oz/week     Comment: only weekends 3-4 beers entire weekend    Current Outpatient Prescriptions  Medication Sig Dispense Refill  . aspirin 81 MG tablet Take 81 mg by mouth daily.    Marland Kitchen atorvastatin (LIPITOR) 80 MG tablet Take 1 tablet (80 mg total) by mouth daily at 6 PM. 30 tablet 1  . Blood Glucose Monitoring Suppl (CONTOUR BLOOD GLUCOSE SYSTEM) DEVI Inject 1 Device as directed 2 (two) times daily. 1 Device 0  . clopidogrel (PLAVIX) 75 MG tablet Take 1 tablet (75 mg total) by mouth daily. 90 tablet 3  . glucose blood (FREESTYLE LITE) test strip Test once daily before breakfast 50 each 12  . Lancets (FREESTYLE) lancets Use as instructed 100 each 12  . metFORMIN (GLUCOPHAGE) 500 MG tablet Take 1 tablet (500 mg total) by mouth daily with breakfast. 30 tablet 1  . metoprolol (LOPRESSOR) 50 MG tablet Take 25 mg by mouth 2 (two) times daily.  1  . nicotine (NICODERM CQ - DOSED IN MG/24 HOURS) 14 mg/24hr patch Place 1 patch (14 mg total) onto the skin daily. 28 patch 0  . oxyCODONE (OXY IR/ROXICODONE) 5 MG immediate release tablet Take 1-2 tablets (5-10 mg total) by mouth every 4 (four) hours as needed  for severe pain. 40 tablet 0  . traMADol (ULTRAM) 50 MG tablet Take 2 tablets (100 mg total) by mouth every 6 (six) hours as needed. 40 tablet 0  . gabapentin (NEURONTIN) 300 MG capsule Take 1 capsule (300 mg total) by mouth 2 (two) times daily. 60 capsule 1   No current facility-administered medications for this visit.     No Known Allergies  Review of Systems Improving strength appetite Incisional pain is improving Sleeping habits are improving No pedal edema No redness or drainage from surgical  incisions  BP 103/68   Pulse 60   Resp 20   Ht  (1.651 m)   Wt 177 lb (80.3 kg)   SpO2 98% Comment: RA  BMI 29.45 kg/m  Physical Exam      Exam    General- alert and comfortable   Lungs- clear without rales, wheezes   Cor- regular rate and rhythm, no murmur , gallop   Abdomen- soft, non-tender   Extremities - warm, non-tender, minimal edema   Neuro- oriented, appropriate, no focal weakness   Diagnostic Tests: Chest x-ray personally reviewed and counseled with patient  Impression: Excellent early recovery after urgent CABG for left main stenosis  Plan: Activity limitations were discussed with patient. He understands he can now lift up to 20 pounds but no more than 20 pounds until 3 months after surgery. He understands he can start driving. He will be recommended for outpatient cardiac rehabilitation. He'll return for follow-up evaluation and monitoring of progress in  approximately 2 months. Continue current meds   Mikey Bussing, MD [facility] [phone]

## 2015-10-31 ENCOUNTER — Telehealth: Payer: Self-pay | Admitting: Cardiovascular Disease

## 2015-10-31 NOTE — Telephone Encounter (Signed)
Clearance for dental extraction routed to Dr. Allyson Sabal  Patient saw R. Raford Pitcher, PA in June Patient takes plavix & asa CABG 08/30/15

## 2015-10-31 NOTE — Telephone Encounter (Signed)
New Message Request for surgical clearance:  1. What type of surgery is being performed? Extraction   2. When is this surgery scheduled? No yet scheduled   3. Are there any medications that need to be held prior to surgery and how long? Calling to confirm   4. Name of physician performing surgery? Johnathon Wayne   5. What is your office phone and fax number? [phone]

## 2015-11-02 NOTE — Telephone Encounter (Signed)
Clearance sent via EPIC 

## 2015-11-02 NOTE — Telephone Encounter (Signed)
Okay to perform dental extraction

## 2015-11-07 ENCOUNTER — Ambulatory Visit (INDEPENDENT_AMBULATORY_CARE_PROVIDER_SITE_OTHER): Payer: Self-pay | Admitting: Cardiothoracic Surgery

## 2015-11-07 ENCOUNTER — Encounter: Payer: Self-pay | Admitting: Cardiothoracic Surgery

## 2015-11-07 ENCOUNTER — Telehealth: Payer: Self-pay | Admitting: *Deleted

## 2015-11-07 ENCOUNTER — Telehealth: Payer: Self-pay | Admitting: Cardiovascular Disease

## 2015-11-07 VITALS — BP 99/70 | HR 70 | Resp 16 | Ht 65.0 in | Wt 172.0 lb

## 2015-11-07 DIAGNOSIS — Z951 Presence of aortocoronary bypass graft: Secondary | ICD-10-CM

## 2015-11-07 DIAGNOSIS — M792 Neuralgia and neuritis, unspecified: Secondary | ICD-10-CM

## 2015-11-07 MED ORDER — GABAPENTIN 300 MG PO CAPS: 300.0000 mg | ORAL_CAPSULE | Freq: Two times a day (BID) | ORAL | 1 refills | Status: DC

## 2015-11-07 NOTE — Telephone Encounter (Signed)
F/U   Calling to get medical clearance. Please call.

## 2015-11-07 NOTE — Telephone Encounter (Signed)
Okay to hold aspirin if necessary prior to extraction. Will need to be held for 5-7 days

## 2015-11-07 NOTE — Telephone Encounter (Signed)
Sent via Epic again

## 2015-11-07 NOTE — Progress Notes (Signed)
PCP is Redmond Baseman, MD Referring Provider is Runell Gess, MD  Chief Complaint  Patient presents with  . Routine Post Op    4-6 wk f/u s/p CABG X 2 .Marland KitchenMarland Kitchen5/25/17    HPI  2 month follow-up after emergency CABG for unstable angina and left main stenosis Patient continues to do well.  Patient has had no recurrent angina. Passed a postop stress test at his cardiologist office. He has started cardiac rehabilitation. Surgical incisions well-healed. The patient is doing his yard work but is now lifting less than 20 pounds maximum.  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  Most importantly the patient is continued to be abstinent from tobacco. He has been using skin patches of nicotine.  Past Medical History:  Diagnosis Date  . Diabetes mellitus type 2, noninsulin dependent (HCC) 08/2015  . Dyslipidemia associated with type 2 diabetes mellitus (HCC) 08/2015  . Tobacco abuse     Past Surgical History:  Procedure Laterality Date  . CARDIAC CATHETERIZATION N/A 08/28/2015   Procedure: Left Heart Cath and Coronary Angiography;  Surgeon: Runell Gess, MD;  Location: [facility] INVASIVE CV LAB;  Service: Cardiovascular;  Laterality: N/A;  . CORONARY ARTERY BYPASS GRAFT N/A 08/30/2015   Procedure: CORONARY ARTERY BYPASS GRAFTING (CABG) x2 using left internal mammary artery and right greater saphenous vein. ;  Surgeon: Kerin Perna, MD;  LIMA-LAD, SVG-RI  . TEE WITHOUT CARDIOVERSION N/A 08/30/2015   Procedure: TRANSESOPHAGEAL ECHOCARDIOGRAM (TEE);  Surgeon: Kerin Perna, MD;  Location: [facility] OR;  Service: Open Heart Surgery;  Laterality: N/A;    Family History  Problem Relation Age of Onset  . Coronary artery disease Father     MI  . Coronary artery disease Sister     s/p CABG x5     Social History Social History  Substance Use Topics  . Smoking status: Former Smoker    Packs/day: 1.50    Types: Cigarettes    Quit date: 08/28/2015  . Smokeless tobacco: Not on file  . Alcohol use 0.0 oz/week     Comment: only weekends 3-4 beers entire weekend    Current Outpatient Prescriptions  Medication Sig Dispense Refill  . aspirin 81 MG tablet Take 81 mg by mouth daily.    Marland Kitchen atorvastatin (LIPITOR) 80 MG tablet Take 1 tablet (80 mg total) by mouth daily at 6 PM. 30 tablet 1  . Blood Glucose Monitoring Suppl (CONTOUR BLOOD GLUCOSE SYSTEM) DEVI Inject 1 Device as directed 2 (two) times daily. 1 Device 0  . gabapentin (NEURONTIN) 300 MG capsule Take 1 capsule (300 mg total) by mouth 2 (two) times daily. 60 capsule 1  . glucose blood (FREESTYLE LITE) test strip Test once daily before breakfast 50 each 12  . Lancets (FREESTYLE) lancets Use as instructed 100 each 12  . metFORMIN (GLUCOPHAGE) 500 MG tablet Take 1 tablet (500 mg total) by mouth daily with breakfast. 30 tablet 1  . metoprolol tartrate (LOPRESSOR) 25 MG tablet Take 12.5 mg by mouth 2 (two) times daily.    . nicotine (NICODERM CQ - DOSED IN MG/24 HR) 7 mg/24hr patch Place 7 mg onto the skin daily.    Marland Kitchen oxyCODONE (OXY IR/ROXICODONE) 5 MG immediate release tablet Take 1-2 tablets (5-10 mg total) by mouth every  4 (four) hours as needed for severe pain. 40 tablet 0  . traMADol (ULTRAM) 50 MG tablet Take 2 tablets (100 mg total) by mouth every 6 (six) hours as needed. 40 tablet 0   No current facility-administered medications for this visit.     No Known Allergies  Review of Systems  Improved energy and exercise tolerance No symptoms of angina or CHF Weight stable No fever Surgical incisions well-healed. Left neuritic pain over left anterior upper chest improved with Neurontin-will continue for 1 more month.  BP 99/70   Pulse 70   Resp 16   Ht  (1.651 m)   Wt 172 lb (78 kg)   SpO2 97% Comment: ON RA   BMI 28.62 kg/m  Physical Exam      Exam    General- alert and comfortable   Lungs- clear without rales, wheezes   Cor- regular rate and rhythm, no murmur , gallop   Abdomen- soft, non-tender   Extremities - warm, non-tender, minimal edema   Neuro- oriented, appropriate, no focal weakness   Diagnostic Tests: None  Impression: Excellent recovery after emergency CABG late May 2017 Plan: He will be able to return to work 12/07/2015.  Mikey Bussing, MD [facility] [phone]

## 2015-11-07 NOTE — Telephone Encounter (Signed)
Received a call from [place] Dec at [facility] for tooth extraction received however holding the ASA was not addressed.  Does ASA need to be held and if so how long? Per wife Dr Purvis Kilts stopped Plavix at last office visit  Will forward to Dr Allyson Sabal for review

## 2015-11-07 NOTE — Telephone Encounter (Signed)
Will forward to [place] Wife aware

## 2015-11-08 ENCOUNTER — Telehealth: Payer: Self-pay | Admitting: *Deleted

## 2015-11-08 NOTE — Telephone Encounter (Signed)
Received call from [facility] Office-states they received clearance yesterday but needed to know what medications to hold prior to extraction.  Per MD Allyson Sabal note on 11/07/15 :    Runell Gess, MD  Cardiology    Okay to hold aspirin if necessary prior to extraction. Will need to be held for 5-7 days     Made aware of MD Allyson Sabal recommendations.  Verbalized understanding.

## 2015-11-13 ENCOUNTER — Encounter (HOSPITAL_COMMUNITY)
Admission: RE | Admit: 2015-11-13 | Discharge: 2015-11-13 | Disposition: A | Discharge status: Home / Self Care | Payer: BLUE CROSS/BLUE SHIELD | Source: Ambulatory Visit | Attending: Cardiovascular Disease | Admitting: Cardiovascular Disease

## 2015-11-13 ENCOUNTER — Telehealth: Payer: Self-pay | Admitting: Cardiovascular Disease

## 2015-11-13 VITALS — BP 104/60 | HR 64 | Ht 66.5 in | Wt 181.0 lb

## 2015-11-13 DIAGNOSIS — Z951 Presence of aortocoronary bypass graft: Secondary | ICD-10-CM | POA: Diagnosis not present

## 2015-11-13 DIAGNOSIS — I214 Non-ST elevation (NSTEMI) myocardial infarction: Secondary | ICD-10-CM

## 2015-11-13 HISTORY — DX: Atherosclerotic heart disease of native coronary artery without angina pectoris: I25.10

## 2015-11-13 NOTE — Telephone Encounter (Signed)
No ATBX needed. OK to have dental procedure

## 2015-11-13 NOTE — Telephone Encounter (Signed)
Clear[facility] to [phone] [facility]

## 2015-11-13 NOTE — Progress Notes (Signed)
Cardiac Rehab Medication Review by a Pharmacist  Does the patient  feel that his/her medications are working for him/her?  no  Has the patient been experiencing any side effects to the medications prescribed?  no  Does the patient measure his/her own blood pressure or blood glucose at home?  yes   Does the patient have any problems obtaining medications due to transportation or finances?   no  Understanding of regimen: good Understanding of indications: good Potential of compliance: good    Pharmacist comments: RC is a [age] YOM presenting today for cardiac rehab orientation. He did not have a physical copy of his medication list, but he was able to recite his regimen clearly and quickly. He doesn't believe that his gabapentin is working very well. He was counseled to talk to his doctor about other options or possibly increasing the nerve. He was also counseled that nerve medications like gabapentin do not always work for all patients. He has blood pressure and blood sugar supplies at home, but does not always check it regularly. For blood glucose, he said that they are pretty regular around 100 in the mornings when he does check them.   Gwyndolyn KaufmanKai Higinio Grow Bernette Redbird(Kenny), PharmD  PGY1 Pharmacy Resident Pager: [phone] 11/13/2015 2:48 PM

## 2015-11-13 NOTE — Telephone Encounter (Signed)
New message      1. What dental office are you calling from? Dr Epifanio LeschesJonathan Wang  2. What is your office phone and fax number? Fax [phone]  3. What type of procedure is the patient having performed? Deep cleaning   4. What date is procedure scheduled?  Pending clearance  5. What is your question (ex. Antibiotics prior to procedure, holding medication-we need to know how long dentist wants pt to hold med)? Calling to see if pt need to hold medications and also need clearance to have deep cleaning

## 2015-11-15 ENCOUNTER — Encounter (HOSPITAL_COMMUNITY): Payer: Self-pay

## 2015-11-15 NOTE — Progress Notes (Signed)
Cardiac Individual Treatment Plan  Patient Details  Name: Justin Olson MRN: [id number] Date of Birth: July 18, 1965 Referring Provider:   Flowsheet Row CARDIAC REHAB PHASE II ORIENTATION from 11/13/2015 in [facility] CARDIAC REHAB  Referring Provider  Nanetta Batty MD      Initial Encounter Date:  Flowsheet Row CARDIAC REHAB PHASE II ORIENTATION from 11/13/2015 in [facility] CARDIAC REHAB  Date  11/13/15  Referring Provider  Nanetta Batty MD      Visit Diagnosis: S/P CABG x 2  NSTEMI (non-ST elevated myocardial infarction) [facility])  Patient's Home Medications on Admission:  Current Outpatient Prescriptions:  .  aspirin 81 MG tablet, Take 81 mg by mouth daily., Disp: , Rfl:  .  atorvastatin (LIPITOR) 80 MG tablet, Take 1 tablet (80 mg total) by mouth daily at 6 PM., Disp: 30 tablet, Rfl: 1 .  Blood Glucose Monitoring Suppl (CONTOUR BLOOD GLUCOSE SYSTEM) DEVI, Inject 1 Device as directed 2 (two) times daily., Disp: 1 Device, Rfl: 0 .  gabapentin (NEURONTIN) 300 MG capsule, Take 1 capsule (300 mg total) by mouth 2 (two) times daily., Disp: 60 capsule, Rfl: 1 .  glucose blood (FREESTYLE LITE) test strip, Test once daily before breakfast, Disp: 50 each, Rfl: 12 .  Lancets (FREESTYLE) lancets, Use as instructed, Disp: 100 each, Rfl: 12 .  metFORMIN (GLUCOPHAGE) 500 MG tablet, Take 1 tablet (500 mg total) by mouth daily with breakfast., Disp: 30 tablet, Rfl: 1 .  metoprolol tartrate (LOPRESSOR) 25 MG tablet, Take 12.5 mg by mouth 2 (two) times daily., Disp: , Rfl:  .  traMADol (ULTRAM) 50 MG tablet, Take 2 tablets (100 mg total) by mouth every 6 (six) hours as needed., Disp: 40 tablet, Rfl: 0 .  nicotine (NICODERM CQ - DOSED IN MG/24 HR) 7 mg/24hr patch, Place 7 mg onto the skin daily., Disp: , Rfl:  .  oxyCODONE (OXY IR/ROXICODONE) 5 MG immediate release tablet, Take 1-2 tablets (5-10 mg total) by mouth every 4 (four) hours as needed for severe pain. (Patient  not taking: Reported on 11/13/2015), Disp: 40 tablet, Rfl: 0  Past Medical History: Past Medical History:  Diagnosis Date  . Coronary artery disease   . Diabetes mellitus type 2, noninsulin dependent (HCC) 08/2015  . Dyslipidemia associated with type 2 diabetes mellitus (HCC) 08/2015  . Tobacco abuse     Tobacco Use: History  Smoking Status  . Former Smoker  . Packs/day: 1.50  . Types: Cigarettes  . Quit date: 08/28/2015  Smokeless Tobacco  . Not on file    Labs: Recent Review Flowsheet Data    Labs for ITP Cardiac and Pulmonary Rehab Latest Ref Rng & Units 08/30/2015 08/30/2015 08/30/2015 08/30/2015 08/31/2015   Cholestrol 0 - 200 mg/dL - - - - -   LDLCALC 0 - 99 mg/dL - - - - -   HDL >40 mg/dL - - - - -   Trlycerides <150 mg/dL - - - - -   Hemoglobin A1c 4.8 - 5.6 % - - - - -   PHART 7.350 - 7.450 7.291(L) 7.334(L) 7.338(L) - -   PCO2ART 35.0 - 45.0 mmHg 47.0(H) 40.5 37.7 - -   HCO3 20.0 - 24.0 mEq/L 22.7 21.5 20.2 - -   TCO2 0 - 100 mmol/L 24 23 21 21 21    ACIDBASEDEF 0.0 - 2.0 mmol/L 4.0(H) 4.0(H) 5.0(H) - -   O2SAT % 93.0 97.0 89.0 - -      Capillary Blood Glucose:  Lab Results  Component Value Date   GLUCAP 97 09/05/2015   GLUCAP 123 (H) 09/05/2015   GLUCAP 95 09/04/2015   GLUCAP 138 (H) 09/04/2015   GLUCAP 105 (H) 09/04/2015     Exercise Target Goals: Date: 11/13/15  Exercise Program Goal: Individual exercise prescription set with THRR, safety & activity barriers. Participant demonstrates ability to understand and report RPE using BORG scale, to self-measure pulse accurately, and to acknowledge the importance of the exercise prescription.  Exercise Prescription Goal: Starting with aerobic activity 30 plus minutes a day, 3 days per week for initial exercise prescription. Provide home exercise prescription and guidelines that participant acknowledges understanding prior to discharge.  Activity Barriers & Risk Stratification:     Activity Barriers & Cardiac  Risk Stratification - 11/13/15 1422      Activity Barriers & Cardiac Risk Stratification   Activity Barriers None   Cardiac Risk Stratification High      6 Minute Walk:     6 Minute Walk    Row Name 11/13/15 1623         6 Minute Walk   Phase Initial     Distance 1383 feet     Walk Time 6 minutes     # of Rest Breaks 0     MPH 2.62     METS 3.73     RPE 7     VO2 Peak 13.06     Symptoms No     Resting HR 64 bpm     Resting BP 104/60     Max Ex. HR 78 bpm     Max Ex. BP 112/72     2 Minute Post BP 108/60        Initial Exercise Prescription:     Initial Exercise Prescription - 11/13/15 1600      Date of Initial Exercise RX and Referring Provider   Date 11/13/15   Referring Provider [name], Jonathan MD     Treadmill   MPH 2.5   Grade 1   Minutes 10   METs 3.26     Bike   Level 1.2   Minutes 10   METs 3.75     NuStep   Level 3   Minutes 10   METs 2.8     Prescription Details   Frequency (times per week) 3   Duration Progress to 30 minutes of continuous aerobic without signs/symptoms of physical distress     Intensity   THRR 40-80% of Max Heartrate 68-136   Ratings of Perceived Exertion 11-13   Perceived Dyspnea 0-4     Progression   Progression Continue to progress workloads to maintain intensity without signs/symptoms of physical distress.     Resistance Training   Training Prescription Yes   Weight 2 lbs   Reps 10-12      Perform Capillary Blood Glucose checks as needed.  Exercise Prescription Changes:   Exercise Comments:   Discharge Exercise Prescription (Final Exercise Prescription Changes):   Nutrition:  Target Goals: Understanding of nutrition guidelines, daily intake of sodium 1500mg , cholesterol 200mg , calories 30% from fat and 7% or less from saturated fats, daily to have 5 or more servings of fruits and vegetables.  Biometrics:     Pre Biometrics - 11/13/15 1621      Pre Biometrics   Height 5' 6.5" (1.689 m)    Weight 181 lb (82.1 kg)   Waist Circumference 38.25 inches   Hip Circumference 39.5 inches   Waist to Hip  Ratio 0.97 %   BMI (Calculated) 28.8   Triceps Skinfold 15 mm   % Body Fat 26.7 %   Grip Strength 43.5 kg   Flexibility 13 in   Single Leg Stand 30 seconds       Nutrition Therapy Plan and Nutrition Goals:   Nutrition Discharge: Nutrition Scores:   Nutrition Goals Re-Evaluation:   Psychosocial: Target Goals: Acknowledge presence or absence of depression, maximize coping skills, provide positive support system. Participant is able to verbalize types and ability to use techniques and skills needed for reducing stress and depression.  Initial Review & Psychosocial Screening:     Initial Psych Review & Screening - 11/13/15 1607      Family Dynamics   Good Support System? Yes     Barriers   Psychosocial barriers to participate in program There are no identifiable barriers or psychosocial needs.;The patient should benefit from training in stress management and relaxation.     Screening Interventions   Interventions Encouraged to exercise      Quality of Life Scores:     Quality of Life - 11/13/15 1608      Quality of Life Scores   Health/Function Pre 26.4 %   Socioeconomic Pre 24.86 %   Psych/Spiritual Pre 27.43 %   Family Pre 30 %   GLOBAL Pre 26.82 %      PHQ-9: Recent Review Flowsheet Data    Depression screen [facility] 2/9 09/26/2015   Decreased Interest 0   Down, Depressed, Hopeless 0   PHQ - 2 Score 0      Psychosocial Evaluation and Intervention:   Psychosocial Re-Evaluation:   Vocational Rehabilitation: Provide vocational rehab assistance to qualifying candidates.   Vocational Rehab Evaluation & Intervention:     Vocational Rehab - 11/13/15 1607      Initial Vocational Rehab Evaluation & Intervention   Assessment shows need for Vocational Rehabilitation Yes   Vocational Rehab Packet given to patient 11/13/15       Education: Education Goals: Education classes will be provided on a weekly basis, covering required topics. Participant will state understanding/return demonstration of topics presented.  Learning Barriers/Preferences:     Learning Barriers/Preferences - 11/15/15 1238      Learning Barriers/Preferences   Learning Barriers --  [place] has a 9th grade education      Education Topics: Count Your Pulse:  -Group instruction provided by verbal instruction, demonstration, patient participation and written materials to support subject.  Instructors address importance of being able to find your pulse and how to count your pulse when at home without a heart monitor.  Patients get hands on experience counting their pulse with staff help and individually.   Heart Attack, Angina, and Risk Factor Modification:  -Group instruction provided by verbal instruction, video, and written materials to support subject.  Instructors address signs and symptoms of angina and heart attacks.    Also discuss risk factors for heart disease and how to make changes to improve heart health risk factors.   Functional Fitness:  -Group instruction provided by verbal instruction, demonstration, patient participation, and written materials to support subject.  Instructors address safety measures for doing things around the house.  Discuss how to get up and down off the floor, how to pick things up properly, how to safely get out of a chair without assistance, and balance training.   Meditation and Mindfulness:  -Group instruction provided by verbal instruction, patient participation, and written materials to support subject.  Instructor addresses importance  of mindfulness and meditation practice to help reduce stress and improve awareness.  Instructor also leads participants through a meditation exercise.    Stretching for Flexibility and Mobility:  -Group instruction provided by verbal instruction, patient  participation, and written materials to support subject.  Instructors lead participants through series of stretches that are designed to increase flexibility thus improving mobility.  These stretches are additional exercise for major muscle groups that are typically performed during regular warm up and cool down.   Hands Only CPR Anytime:  -Group instruction provided by verbal instruction, video, patient participation and written materials to support subject.  Instructors co-teach with AHA video for hands only CPR.  Participants get hands on experience with mannequins.   Nutrition I class: Heart Healthy Eating:  -Group instruction provided by PowerPoint slides, verbal discussion, and written materials to support subject matter. The instructor gives an explanation and review of the Therapeutic Lifestyle Changes diet recommendations, which includes a discussion on lipid goals, dietary fat, sodium, fiber, plant stanol/sterol esters, sugar, and the components of a well-balanced, healthy diet.   Nutrition II class: Lifestyle Skills:  -Group instruction provided by PowerPoint slides, verbal discussion, and written materials to support subject matter. The instructor gives an explanation and review of label reading, grocery shopping for heart health, heart healthy recipe modifications, and ways to make healthier choices when eating out.   Diabetes Question & Answer:  -Group instruction provided by PowerPoint slides, verbal discussion, and written materials to support subject matter. The instructor gives an explanation and review of diabetes co-morbidities, pre- and post-prandial blood glucose goals, pre-exercise blood glucose goals, signs, symptoms, and treatment of hypoglycemia and hyperglycemia, and foot care basics.   Diabetes Blitz:  -Group instruction provided by PowerPoint slides, verbal discussion, and written materials to support subject matter. The instructor gives an explanation and review of  the physiology behind type 1 and type 2 diabetes, diabetes medications and rational behind using different medications, pre- and post-prandial blood glucose recommendations and Hemoglobin A1c goals, diabetes diet, and exercise including blood glucose guidelines for exercising safely.    Portion Distortion:  -Group instruction provided by PowerPoint slides, verbal discussion, written materials, and food models to support subject matter. The instructor gives an explanation of serving size versus portion size, changes in portions sizes over the last 20 years, and what consists of a serving from each food group.   Stress Management:  -Group instruction provided by verbal instruction, video, and written materials to support subject matter.  Instructors review role of stress in heart disease and how to cope with stress positively.     Exercising on Your Own:  -Group instruction provided by verbal instruction, power point, and written materials to support subject.  Instructors discuss benefits of exercise, components of exercise, frequency and intensity of exercise, and end points for exercise.  Also discuss use of nitroglycerin and activating EMS.  Review options of places to exercise outside of rehab.  Review guidelines for sex with heart disease.   Cardiac Drugs I:  -Group instruction provided by verbal instruction and written materials to support subject.  Instructor reviews cardiac drug classes: antiplatelets, anticoagulants, beta blockers, and statins.  Instructor discusses reasons, side effects, and lifestyle considerations for each drug class.   Cardiac Drugs II:  -Group instruction provided by verbal instruction and written materials to support subject.  Instructor reviews cardiac drug classes: angiotensin converting enzyme inhibitors (ACE-I), angiotensin II receptor blockers (ARBs), nitrates, and calcium channel blockers.  Instructor discusses reasons, side  effects, and lifestyle  considerations for each drug class.   Anatomy and Physiology of the Circulatory System:  -Group instruction provided by verbal instruction, video, and written materials to support subject.  Reviews functional anatomy of heart, how it relates to various diagnoses, and what role the heart plays in the overall system.   Knowledge Questionnaire Score:     Knowledge Questionnaire Score - 11/13/15 1607      Knowledge Questionnaire Score   Pre Score 19/24      Core Components/Risk Factors/Patient Goals at Admission:     Personal Goals and Risk Factors at Admission - 11/13/15 1628      Core Components/Risk Factors/Patient Goals on Admission    Weight Management Weight Loss;Yes   Intervention Weight Management: Provide education and appropriate resources to help participant work on and attain dietary goals.;Weight Management: Develop a combined nutrition and exercise program designed to reach desired caloric intake, while maintaining appropriate intake of nutrient and fiber, sodium and fats, and appropriate energy expenditure required for the weight goal.   Admit Weight 181 lb (82.1 kg)   Goal Weight: Short Term 175 lb (79.4 kg)   Goal Weight: Long Term 170 lb (77.1 kg)   Expected Outcomes Short Term: Continue to assess and modify interventions until short term weight is achieved;Long Term: Adherence to nutrition and physical activity/exercise program aimed toward attainment of established weight goal   Tobacco Cessation Yes   Number of packs per day 1   Intervention Offer self-teaching materials, assist with locating and accessing local/national Quit Smoking programs, and support quit date choice.   Expected Outcomes Long Term: Complete abstinence from all tobacco products for at least 12 months from quit date.;Short Term: Will quit all tobacco product use, adhering to prevention of relapse plan.   Diabetes Yes   Intervention Provide education about proper nutrition, including hydration,  and aerobic/resistive exercise prescription along with prescribed medications to achieve blood glucose in normal ranges: Fasting glucose 65-99 mg/dL;Provide education about signs/symptoms and action to take for hypo/hyperglycemia.   Expected Outcomes Short Term: Participant verbalizes understanding of the signs/symptoms and immediate care of hyper/hypoglycemia, proper foot care and importance of medication, aerobic/resistive exercise and nutrition plan for blood glucose control.;Long Term: Attainment of HbA1C < 7%.   Personal Goal Other Yes   Personal Goal Eat healthy. Chest stronger.   Intervention Provide education about heart healthy eating.   Expected Outcomes Adherence to heart healthy diet guidelines provided by dietician.      Core Components/Risk Factors/Patient Goals Review:    Core Components/Risk Factors/Patient Goals at Discharge (Final Review):    ITP Comments:     ITP Comments    Row Name 11/15/15 1224           ITP Comments Dr Armanda Magic is the medical director of cardiac rehab          Comments: Patient attended orientation from 1330 to 1530 to review rules and guidelines for program. Completed 6 minute walk test, Intitial ITP, and exercise prescription.  VSS. Telemetry-Sinus Rhythm with a rare PVC.  Asymptomatic.Gladstone Lighter, RN,BSN 11/15/2015 12:54 PM

## 2015-11-19 ENCOUNTER — Encounter (HOSPITAL_COMMUNITY): Payer: Self-pay

## 2015-11-19 ENCOUNTER — Encounter (HOSPITAL_COMMUNITY)
Admission: RE | Admit: 2015-11-19 | Discharge: 2015-11-19 | Disposition: A | Discharge status: Home / Self Care | Payer: BLUE CROSS/BLUE SHIELD | Source: Ambulatory Visit | Attending: Cardiovascular Disease | Admitting: Cardiovascular Disease

## 2015-11-19 DIAGNOSIS — Z951 Presence of aortocoronary bypass graft: Secondary | ICD-10-CM | POA: Diagnosis not present

## 2015-11-19 DIAGNOSIS — I214 Non-ST elevation (NSTEMI) myocardial infarction: Secondary | ICD-10-CM

## 2015-11-19 LAB — GLUCOSE, CAPILLARY
GLUCOSE-CAPILLARY: 149 mg/dL — AB (ref 65–99)
Glucose-Capillary: 120 mg/dL — ABNORMAL HIGH (ref 65–99)

## 2015-11-19 NOTE — Progress Notes (Signed)
Daily Session Note  Patient Details  Name: Justin Olson MRN: [id number] Date of Birth: [birth year] Referring Provider:   Flowsheet Row CARDIAC REHAB PHASE II ORIENTATION from 11/13/2015 in [place]  Referring Provider  Quay Burow MD      Encounter Date: 11/19/2015  Check In:   Capillary Blood Glucose: No results found for this or any previous visit (from the past 24 hour(s)).   Goals Met:  Exercise tolerated well  Goals Unmet:  Not Applicable  Comments: Pt started cardiac rehab today.  Pt tolerated light exercise without difficulty. VSS, telemetry-Sinus Rhythm, asymptomatic.  Medication list reconciled. Pt denies barriers to medicaiton compliance.  PSYCHOSOCIAL ASSESSMENT:  PHQ-0. Pt exhibits positive coping skills, hopeful outlook with supportive family. No psychosocial needs identified at this time, no psychosocial interventions necessary.    Pt enjoys fishing, riding his motorcycle and walking.   Pt oriented to exercise equipment and routine.    Understanding verbalized. Justin Olson is not smoking cigarettes and is doing good with smoking cessation. Justin Olson plans to switched to the 6:45 am class next week, that class will be better for Justin Olson when he returns to work. Barnet Pall, RN,BSN 11/19/2015 10:07 AM   Dr. Fransico Him is Medical Director for Cardiac Rehab at [facility].

## 2015-11-21 ENCOUNTER — Encounter (HOSPITAL_COMMUNITY)
Admission: RE | Admit: 2015-11-21 | Discharge: 2015-11-21 | Disposition: A | Discharge status: Home / Self Care | Payer: BLUE CROSS/BLUE SHIELD | Source: Ambulatory Visit | Attending: Cardiovascular Disease | Admitting: Cardiovascular Disease

## 2015-11-21 DIAGNOSIS — I214 Non-ST elevation (NSTEMI) myocardial infarction: Secondary | ICD-10-CM | POA: Diagnosis not present

## 2015-11-21 DIAGNOSIS — Z951 Presence of aortocoronary bypass graft: Secondary | ICD-10-CM | POA: Diagnosis not present

## 2015-11-21 LAB — GLUCOSE, CAPILLARY
Glucose-Capillary: 212 mg/dL — ABNORMAL HIGH (ref 65–99)
Glucose-Capillary: 118 mg/dL — ABNORMAL HIGH (ref 65–99)

## 2015-11-21 NOTE — Progress Notes (Signed)
Reviewed home exercise guidelines with patient including endpoints, temperature precautions, target heart rate and rate of perceived exertion. Pt is currently walking 25 minutes 1-2 times daily as his mode of home exercise. Pt voices understanding of instructions given. Artist Paislinty M Nicklas Mcsweeney, MS, ACSM CCEP

## 2015-11-22 NOTE — Progress Notes (Signed)
Cardiac Individual Treatment Plan  Patient Details  Name: Justin Olson MRN: [id number] Date of Birth: October 14, 1965 Referring Provider:   Flowsheet Row CARDIAC REHAB PHASE II ORIENTATION from 11/13/2015 in [facility] CARDIAC REHAB  Referring Provider  Nanetta Batty MD      Initial Encounter Date:  Flowsheet Row CARDIAC REHAB PHASE II ORIENTATION from 11/13/2015 in [facility] CARDIAC REHAB  Date  11/13/15  Referring Provider  Nanetta Batty MD      Visit Diagnosis: S/P CABG x 2  NSTEMI (non-ST elevated myocardial infarction) [facility])  Patient's Home Medications on Admission:  Current Outpatient Prescriptions:  .  aspirin 81 MG tablet, Take 81 mg by mouth daily., Disp: , Rfl:  .  atorvastatin (LIPITOR) 80 MG tablet, Take 1 tablet (80 mg total) by mouth daily at 6 PM., Disp: 30 tablet, Rfl: 1 .  Blood Glucose Monitoring Suppl (CONTOUR BLOOD GLUCOSE SYSTEM) DEVI, Inject 1 Device as directed 2 (two) times daily., Disp: 1 Device, Rfl: 0 .  gabapentin (NEURONTIN) 300 MG capsule, Take 1 capsule (300 mg total) by mouth 2 (two) times daily., Disp: 60 capsule, Rfl: 1 .  glucose blood (FREESTYLE LITE) test strip, Test once daily before breakfast, Disp: 50 each, Rfl: 12 .  Lancets (FREESTYLE) lancets, Use as instructed, Disp: 100 each, Rfl: 12 .  metFORMIN (GLUCOPHAGE) 500 MG tablet, Take 1 tablet (500 mg total) by mouth daily with breakfast., Disp: 30 tablet, Rfl: 1 .  metoprolol tartrate (LOPRESSOR) 25 MG tablet, Take 12.5 mg by mouth 2 (two) times daily., Disp: , Rfl:  .  nicotine (NICODERM CQ - DOSED IN MG/24 HR) 7 mg/24hr patch, Place 7 mg onto the skin daily., Disp: , Rfl:  .  oxyCODONE (OXY IR/ROXICODONE) 5 MG immediate release tablet, Take 1-2 tablets (5-10 mg total) by mouth every 4 (four) hours as needed for severe pain. (Patient not taking: Reported on 11/13/2015), Disp: 40 tablet, Rfl: 0 .  traMADol (ULTRAM) 50 MG tablet, Take 2 tablets (100 mg total) by  mouth every 6 (six) hours as needed., Disp: 40 tablet, Rfl: 0  Past Medical History: Past Medical History:  Diagnosis Date  . Coronary artery disease   . Diabetes mellitus type 2, noninsulin dependent (HCC) 08/2015  . Dyslipidemia associated with type 2 diabetes mellitus (HCC) 08/2015  . Tobacco abuse     Tobacco Use: History  Smoking Status  . Former Smoker  . Packs/day: 1.50  . Types: Cigarettes  . Quit date: 08/28/2015  Smokeless Tobacco  . Not on file    Labs: Recent Review Flowsheet Data    Labs for ITP Cardiac and Pulmonary Rehab Latest Ref Rng & Units 08/30/2015 08/30/2015 08/30/2015 08/30/2015 08/31/2015   Cholestrol 0 - 200 mg/dL - - - - -   LDLCALC 0 - 99 mg/dL - - - - -   HDL >40 mg/dL - - - - -   Trlycerides <150 mg/dL - - - - -   Hemoglobin A1c 4.8 - 5.6 % - - - - -   PHART 7.350 - 7.450 7.291(L) 7.334(L) 7.338(L) - -   PCO2ART 35.0 - 45.0 mmHg 47.0(H) 40.5 37.7 - -   HCO3 20.0 - 24.0 mEq/L 22.7 21.5 20.2 - -   TCO2 0 - 100 mmol/L 24 23 21 21 21    ACIDBASEDEF 0.0 - 2.0 mmol/L 4.0(H) 4.0(H) 5.0(H) - -   O2SAT % 93.0 97.0 89.0 - -      Capillary Blood Glucose:  Lab Results  Component Value Date   GLUCAP 118 (H) 11/21/2015   GLUCAP 212 (H) 11/21/2015   GLUCAP 120 (H) 11/19/2015   GLUCAP 149 (H) 11/19/2015   GLUCAP 97 09/05/2015     Exercise Target Goals:    Exercise Program Goal: Individual exercise prescription set with THRR, safety & activity barriers. Participant demonstrates ability to understand and report RPE using BORG scale, to self-measure pulse accurately, and to acknowledge the importance of the exercise prescription.  Exercise Prescription Goal: Starting with aerobic activity 30 plus minutes a day, 3 days per week for initial exercise prescription. Provide home exercise prescription and guidelines that participant acknowledges understanding prior to discharge.  Activity Barriers & Risk Stratification:     Activity Barriers & Cardiac Risk  Stratification - 11/13/15 1422      Activity Barriers & Cardiac Risk Stratification   Activity Barriers None   Cardiac Risk Stratification High      6 Minute Walk:     6 Minute Walk    Row Name 11/13/15 1623         6 Minute Walk   Phase Initial     Distance 1383 feet     Walk Time 6 minutes     # of Rest Breaks 0     MPH 2.62     METS 3.73     RPE 7     VO2 Peak 13.06     Symptoms No     Resting HR 64 bpm     Resting BP 104/60     Max Ex. HR 78 bpm     Max Ex. BP 112/72     2 Minute Post BP 108/60        Initial Exercise Prescription:     Initial Exercise Prescription - 11/13/15 1600      Date of Initial Exercise RX and Referring Provider   Date 11/13/15   Referring Provider Nanetta Batty MD     Treadmill   MPH 2.5   Grade 1   Minutes 10   METs 3.26     Bike   Level 1.2   Minutes 10   METs 3.75     NuStep   Level 3   Minutes 10   METs 2.8     Prescription Details   Frequency (times per week) 3   Duration Progress to 30 minutes of continuous aerobic without signs/symptoms of physical distress     Intensity   THRR 40-80% of Max Heartrate 68-136   Ratings of Perceived Exertion 11-13   Perceived Dyspnea 0-4     Progression   Progression Continue to progress workloads to maintain intensity without signs/symptoms of physical distress.     Resistance Training   Training Prescription Yes   Weight 2 lbs   Reps 10-12      Perform Capillary Blood Glucose checks as needed.  Exercise Prescription Changes:      Exercise Prescription Changes    Row Name 11/21/15 1700             Exercise Review   Progression Yes         Response to Exercise   Blood Pressure (Admit) 102/76       Blood Pressure (Exercise) 144/70       Blood Pressure (Exit) 114/70       Heart Rate (Admit) 63 bpm       Heart Rate (Exercise) 107 bpm       Heart Rate (Exit)  65 bpm       Rating of Perceived Exertion (Exercise) 11       Comments Reviewed home exercise  guidelines on 11/21/15.       Duration Progress to 30 minutes of continuous aerobic without signs/symptoms of physical distress       Intensity THRR unchanged         Progression   Progression Continue to progress workloads to maintain intensity without signs/symptoms of physical distress.       Average METs 3.5         Resistance Training   Training Prescription Yes       Weight 2 lbs       Reps 10-12         Interval Training   Interval Training No         Treadmill   MPH 3       Grade 2       Minutes 10       METs 4.12         Bike   Level 1.5       Minutes 10       METs 4.44         NuStep   Level 4       Minutes 10       METs 2.6         Home Exercise Plan   Plans to continue exercise at Home       Frequency Add 4 additional days to program exercise sessions.          Exercise Comments:      Exercise Comments    Row Name 11/21/15 1049           Exercise Comments Reviewed home exercise guidelines. Increasing workloads consistently as tolerated.          Discharge Exercise Prescription (Final Exercise Prescription Changes):     Exercise Prescription Changes - 11/21/15 1700      Exercise Review   Progression Yes     Response to Exercise   Blood Pressure (Admit) 102/76   Blood Pressure (Exercise) 144/70   Blood Pressure (Exit) 114/70   Heart Rate (Admit) 63 bpm   Heart Rate (Exercise) 107 bpm   Heart Rate (Exit) 65 bpm   Rating of Perceived Exertion (Exercise) 11   Comments Reviewed home exercise guidelines on 11/21/15.   Duration Progress to 30 minutes of continuous aerobic without signs/symptoms of physical distress   Intensity THRR unchanged     Progression   Progression Continue to progress workloads to maintain intensity without signs/symptoms of physical distress.   Average METs 3.5     Resistance Training   Training Prescription Yes   Weight 2 lbs   Reps 10-12     Interval Training   Interval Training No     Treadmill   MPH 3    Grade 2   Minutes 10   METs 4.12     Bike   Level 1.5   Minutes 10   METs 4.44     NuStep   Level 4   Minutes 10   METs 2.6     Home Exercise Plan   Plans to continue exercise at Home   Frequency Add 4 additional days to program exercise sessions.      Nutrition:  Target Goals: Understanding of nutrition guidelines, daily intake of sodium 1500mg , cholesterol 200mg , calories 30% from fat and 7% or less from saturated fats,  daily to have 5 or more servings of fruits and vegetables.  Biometrics:     Pre Biometrics - 11/13/15 1621      Pre Biometrics   Height 5' 6.5" (1.689 m)   Weight 181 lb (82.1 kg)   Waist Circumference 38.25 inches   Hip Circumference 39.5 inches   Waist to Hip Ratio 0.97 %   BMI (Calculated) 28.8   Triceps Skinfold 15 mm   % Body Fat 26.7 %   Grip Strength 43.5 kg   Flexibility 13 in   Single Leg Stand 30 seconds       Nutrition Therapy Plan and Nutrition Goals:     Nutrition Therapy & Goals - 11/19/15 1424      Nutrition Therapy   Diet Carb Modified, Therapeutic Lifestyle Changes     Personal Nutrition Goals   Personal Goal #1 1-2 lb wt loss/week to a goal wt loss of 6-24 lb at graduation from Cardiac Rehab   Personal Goal #2 Improved glycemic control as evidenced by an A1c decrease from 7.9 toward a goal of less than 7.0     Intervention Plan   Intervention Prescribe, educate and counsel regarding individualized specific dietary modifications aiming towards targeted core components such as weight, hypertension, lipid management, diabetes, heart failure and other comorbidities.   Expected Outcomes Short Term Goal: Understand basic principles of dietary content, such as calories, fat, sodium, cholesterol and nutrients.;Long Term Goal: Adherence to prescribed nutrition plan.      Nutrition Discharge: Nutrition Scores:     Nutrition Assessments - 11/19/15 1423      MEDFICTS Scores   Pre Score 33      Nutrition Goals  Re-Evaluation:   Psychosocial: Target Goals: Acknowledge presence or absence of depression, maximize coping skills, provide positive support system. Participant is able to verbalize types and ability to use techniques and skills needed for reducing stress and depression.  Initial Review & Psychosocial Screening:     Initial Psych Review & Screening - 11/13/15 1607      Family Dynamics   Good Support System? Yes     Barriers   Psychosocial barriers to participate in program There are no identifiable barriers or psychosocial needs.;The patient should benefit from training in stress management and relaxation.     Screening Interventions   Interventions Encouraged to exercise      Quality of Life Scores:     Quality of Life - 11/13/15 1608      Quality of Life Scores   Health/Function Pre 26.4 %   Socioeconomic Pre 24.86 %   Psych/Spiritual Pre 27.43 %   Family Pre 30 %   GLOBAL Pre 26.82 %      PHQ-9: Recent Review Flowsheet Data    Depression screen [facility] 2/9 11/19/2015 09/26/2015   Decreased Interest 0 0   Down, Depressed, Hopeless 0 0   PHQ - 2 Score 0 0      Psychosocial Evaluation and Intervention:   Psychosocial Re-Evaluation:     Psychosocial Re-Evaluation    Row Name 11/22/15 [phone]             Psychosocial Re-Evaluation   Interventions Encouraged to attend Cardiac Rehabilitation for the exercise       Continued Psychosocial Services Needed No          Vocational Rehabilitation: Provide vocational rehab assistance to qualifying candidates.   Vocational Rehab Evaluation & Intervention:     Vocational Rehab - 11/13/15 1607  Initial Vocational Rehab Evaluation & Intervention   Assessment shows need for Vocational Rehabilitation Yes   Vocational Rehab Packet given to patient 11/13/15      Education: Education Goals: Education classes will be provided on a weekly basis, covering required topics. Participant will state understanding/return  demonstration of topics presented.  Learning Barriers/Preferences:     Learning Barriers/Preferences - 11/15/15 1238      Learning Barriers/Preferences   Learning Barriers --  Justin Olson has a 9th grade education      Education Topics: Count Your Pulse:  -Group instruction provided by verbal instruction, demonstration, patient participation and written materials to support subject.  Instructors address importance of being able to find your pulse and how to count your pulse when at home without a heart monitor.  Patients get hands on experience counting their pulse with staff help and individually.   Heart Attack, Angina, and Risk Factor Modification:  -Group instruction provided by verbal instruction, video, and written materials to support subject.  Instructors address signs and symptoms of angina and heart attacks.    Also discuss risk factors for heart disease and how to make changes to improve heart health risk factors.   Functional Fitness:  -Group instruction provided by verbal instruction, demonstration, patient participation, and written materials to support subject.  Instructors address safety measures for doing things around the house.  Discuss how to get up and down off the floor, how to pick things up properly, how to safely get out of a chair without assistance, and balance training.   Meditation and Mindfulness:  -Group instruction provided by verbal instruction, patient participation, and written materials to support subject.  Instructor addresses importance of mindfulness and meditation practice to help reduce stress and improve awareness.  Instructor also leads participants through a meditation exercise.    Stretching for Flexibility and Mobility:  -Group instruction provided by verbal instruction, patient participation, and written materials to support subject.  Instructors lead participants through series of stretches that are designed to increase flexibility thus  improving mobility.  These stretches are additional exercise for major muscle groups that are typically performed during regular warm up and cool down.   Hands Only CPR Anytime:  -Group instruction provided by verbal instruction, video, patient participation and written materials to support subject.  Instructors co-teach with AHA video for hands only CPR.  Participants get hands on experience with mannequins.   Nutrition I class: Heart Healthy Eating:  -Group instruction provided by PowerPoint slides, verbal discussion, and written materials to support subject matter. The instructor gives an explanation and review of the Therapeutic Lifestyle Changes diet recommendations, which includes a discussion on lipid goals, dietary fat, sodium, fiber, plant stanol/sterol esters, sugar, and the components of a well-balanced, healthy diet.   Nutrition II class: Lifestyle Skills:  -Group instruction provided by PowerPoint slides, verbal discussion, and written materials to support subject matter. The instructor gives an explanation and review of label reading, grocery shopping for heart health, heart healthy recipe modifications, and ways to make healthier choices when eating out.   Diabetes Question & Answer:  -Group instruction provided by PowerPoint slides, verbal discussion, and written materials to support subject matter. The instructor gives an explanation and review of diabetes co-morbidities, pre- and post-prandial blood glucose goals, pre-exercise blood glucose goals, signs, symptoms, and treatment of hypoglycemia and hyperglycemia, and foot care basics.   Diabetes Blitz:  -Group instruction provided by PowerPoint slides, verbal discussion, and written materials to support subject matter. The instructor gives  an explanation and review of the physiology behind type 1 and type 2 diabetes, diabetes medications and rational behind using different medications, pre- and post-prandial blood glucose  recommendations and Hemoglobin A1c goals, diabetes diet, and exercise including blood glucose guidelines for exercising safely.    Portion Distortion:  -Group instruction provided by PowerPoint slides, verbal discussion, written materials, and food models to support subject matter. The instructor gives an explanation of serving size versus portion size, changes in portions sizes over the last 20 years, and what consists of a serving from each food group.   Stress Management:  -Group instruction provided by verbal instruction, video, and written materials to support subject matter.  Instructors review role of stress in heart disease and how to cope with stress positively.     Exercising on Your Own:  -Group instruction provided by verbal instruction, power point, and written materials to support subject.  Instructors discuss benefits of exercise, components of exercise, frequency and intensity of exercise, and end points for exercise.  Also discuss use of nitroglycerin and activating EMS.  Review options of places to exercise outside of rehab.  Review guidelines for sex with heart disease.   Cardiac Drugs I:  -Group instruction provided by verbal instruction and written materials to support subject.  Instructor reviews cardiac drug classes: antiplatelets, anticoagulants, beta blockers, and statins.  Instructor discusses reasons, side effects, and lifestyle considerations for each drug class.   Cardiac Drugs II:  -Group instruction provided by verbal instruction and written materials to support subject.  Instructor reviews cardiac drug classes: angiotensin converting enzyme inhibitors (ACE-I), angiotensin II receptor blockers (ARBs), nitrates, and calcium channel blockers.  Instructor discusses reasons, side effects, and lifestyle considerations for each drug class.   Anatomy and Physiology of the Circulatory System:  -Group instruction provided by verbal instruction, video, and written  materials to support subject.  Reviews functional anatomy of heart, how it relates to various diagnoses, and what role the heart plays in the overall system.   Knowledge Questionnaire Score:     Knowledge Questionnaire Score - 11/19/15 1424      Knowledge Questionnaire Score   Pre Score 19/24     DM 14/15      Core Components/Risk Factors/Patient Goals at Admission:     Personal Goals and Risk Factors at Admission - 11/13/15 1628      Core Components/Risk Factors/Patient Goals on Admission    Weight Management Weight Loss;Yes   Intervention Weight Management: Provide education and appropriate resources to help participant work on and attain dietary goals.;Weight Management: Develop a combined nutrition and exercise program designed to reach desired caloric intake, while maintaining appropriate intake of nutrient and fiber, sodium and fats, and appropriate energy expenditure required for the weight goal.   Admit Weight 181 lb (82.1 kg)   Goal Weight: Short Term 175 lb (79.4 kg)   Goal Weight: Long Term 170 lb (77.1 kg)   Expected Outcomes Short Term: Continue to assess and modify interventions until short term weight is achieved;Long Term: Adherence to nutrition and physical activity/exercise program aimed toward attainment of established weight goal   Tobacco Cessation Yes   Number of packs per day 1   Intervention Offer self-teaching materials, assist with locating and accessing local/national Quit Smoking programs, and support quit date choice.   Expected Outcomes Long Term: Complete abstinence from all tobacco products for at least 12 months from quit date.;Short Term: Will quit all tobacco product use, adhering to prevention of relapse plan.  Diabetes Yes   Intervention Provide education about proper nutrition, including hydration, and aerobic/resistive exercise prescription along with prescribed medications to achieve blood glucose in normal ranges: Fasting glucose 65-99  mg/dL;Provide education about signs/symptoms and action to take for hypo/hyperglycemia.   Expected Outcomes Short Term: Participant verbalizes understanding of the signs/symptoms and immediate care of hyper/hypoglycemia, proper foot care and importance of medication, aerobic/resistive exercise and nutrition plan for blood glucose control.;Long Term: Attainment of HbA1C < 7%.   Personal Goal Other Yes   Personal Goal Eat healthy. Chest stronger.   Intervention Provide education about heart healthy eating.   Expected Outcomes Adherence to heart healthy diet guidelines provided by dietician.      Core Components/Risk Factors/Patient Goals Review:      Goals and Risk Factor Review    Row Name 11/21/15 1050             Core Components/Risk Factors/Patient Goals Review   Personal Goals Review (P)  Other       Review (P)  Patient states he's making healthier eating choices, which is one of his personal goals. Pt feels chest is healing well and is getting stronger.       Expected Outcomes (P)  Patient will continue to make heart healthy eating. Increase workloads as tolerated to help achieve weight loss goals.          Core Components/Risk Factors/Patient Goals at Discharge (Final Review):      Goals and Risk Factor Review - 11/21/15 1050      Core Components/Risk Factors/Patient Goals Review   Personal Goals Review (P)  Other   Review (P)  Patient states he's making healthier eating choices, which is one of his personal goals. Pt feels chest is healing well and is getting stronger.   Expected Outcomes (P)  Patient will continue to make heart healthy eating. Increase workloads as tolerated to help achieve weight loss goals.      ITP Comments:     ITP Comments    Row Name 11/15/15 1224           ITP Comments Dr Armanda Magic is the medical director of cardiac rehab          Comments:Justin Olson is making expected progress toward personal goals after completing 3 sessions. Recommend  continued exercise and life style modification education including  stress management and relaxation techniques to decrease cardiac risk profile. Gladstone Lighter, RN,BSN 11/23/2015 9:44 AM

## 2015-11-23 ENCOUNTER — Encounter (HOSPITAL_COMMUNITY)
Admission: RE | Admit: 2015-11-23 | Discharge: 2015-11-23 | Disposition: A | Discharge status: Home / Self Care | Payer: BLUE CROSS/BLUE SHIELD | Source: Ambulatory Visit | Attending: Cardiovascular Disease | Admitting: Cardiovascular Disease

## 2015-11-23 DIAGNOSIS — Z951 Presence of aortocoronary bypass graft: Secondary | ICD-10-CM | POA: Diagnosis not present

## 2015-11-23 DIAGNOSIS — I214 Non-ST elevation (NSTEMI) myocardial infarction: Secondary | ICD-10-CM | POA: Diagnosis not present

## 2015-11-23 LAB — GLUCOSE, CAPILLARY
GLUCOSE-CAPILLARY: 194 mg/dL — AB (ref 65–99)
GLUCOSE-CAPILLARY: 132 mg/dL — AB (ref 65–99)

## 2015-11-23 NOTE — Progress Notes (Signed)
Justin Olson 50 y.o. male Nutrition Note Spoke with pt. Nutrition Plan and Nutrition Survey goals reviewed with pt. Pt is following Step 1 of the Therapeutic Lifestyle Changes diet. Pt wants to lose wt. Pt reports he lost 9 lb after surgery  and has gained the wt he lost back. Pt recently quit using tobacco. Per pt, food tastes better, but he has been able to maintain his wt so far. Wt loss tips reviewed. Pt is diabetic. Last A1c indicates blood glucose not optimally controlled. This Clinical research associatewriter went over Diabetes Education test results. Pt did not check CBG's regularly before surgery. Pt has been checking fasting CBG's since surgery until recently. Pt states "I got out of my normal routine and I've forgotten to check it." Fasting CBG's reportedly 98-101 mg/dL when CBG's checked. Pt expressed understanding of the information reviewed. Pt aware of nutrition education classes offered and plans on attending nutrition classes.  Lab Results  Component Value Date   HGBA1C 7.9 (H) 08/29/2015   Wt Readings from Last 3 Encounters:  11/13/15 181 lb (82.1 kg)  11/07/15 172 lb (78 kg)  10/03/15 177 lb (80.3 kg)    Nutrition Diagnosis ? Food-and nutrition-related knowledge deficit related to lack of exposure to information as related to diagnosis of: ? CVD ? DM ? Overweight related to excessive energy intake as evidenced by a BMI of 28.8  Nutrition Intervention ? Pt's individual nutrition plan reviewed with pt. ? Benefits of adopting Therapeutic Lifestyle Changes discussed when Medficts reviewed. ? Pt to attend the Portion Distortion class ? Pt to attend the Diabetes Q & A class ? Pt to attend the   ? Nutrition I class                     ? Nutrition II class     ? Diabetes Blitz class    ? Continue client-centered nutrition education by RD, as part of interdisciplinary care. Goal(s) ? Pt to identify and limit food sources of saturated fat, trans fat, and sodium ? Pt to identify food quantities necessary  to achieve weight loss of 6-24 lb (2.7-10.9 kg) at graduation from cardiac rehab.  ? CBG concentrations in the normal range or as close to normal as is safely possible. Monitor and Evaluate progress toward nutrition goal with team. [name], M.Ed, RD, LDN, CDE 11/23/2015 10:45 AM

## 2015-11-26 ENCOUNTER — Encounter (HOSPITAL_COMMUNITY)
Admission: RE | Admit: 2015-11-26 | Discharge: 2015-11-26 | Disposition: A | Discharge status: Home / Self Care | Payer: BLUE CROSS/BLUE SHIELD | Source: Ambulatory Visit | Attending: Cardiovascular Disease | Admitting: Cardiovascular Disease

## 2015-11-26 ENCOUNTER — Encounter (HOSPITAL_COMMUNITY): Payer: BLUE CROSS/BLUE SHIELD

## 2015-11-26 DIAGNOSIS — I214 Non-ST elevation (NSTEMI) myocardial infarction: Secondary | ICD-10-CM | POA: Diagnosis not present

## 2015-11-26 DIAGNOSIS — Z951 Presence of aortocoronary bypass graft: Secondary | ICD-10-CM | POA: Diagnosis not present

## 2015-11-28 ENCOUNTER — Encounter (HOSPITAL_COMMUNITY)
Admission: RE | Admit: 2015-11-28 | Discharge: 2015-11-28 | Disposition: A | Discharge status: Home / Self Care | Payer: BLUE CROSS/BLUE SHIELD | Source: Ambulatory Visit | Attending: Cardiovascular Disease | Admitting: Cardiovascular Disease

## 2015-11-28 ENCOUNTER — Encounter (HOSPITAL_COMMUNITY): Payer: BLUE CROSS/BLUE SHIELD

## 2015-11-28 DIAGNOSIS — Z951 Presence of aortocoronary bypass graft: Secondary | ICD-10-CM

## 2015-11-28 DIAGNOSIS — I214 Non-ST elevation (NSTEMI) myocardial infarction: Secondary | ICD-10-CM

## 2015-11-30 ENCOUNTER — Encounter (HOSPITAL_COMMUNITY)
Admission: RE | Admit: 2015-11-30 | Discharge: 2015-11-30 | Disposition: A | Discharge status: Home / Self Care | Payer: BLUE CROSS/BLUE SHIELD | Source: Ambulatory Visit | Attending: Cardiovascular Disease | Admitting: Cardiovascular Disease

## 2015-11-30 ENCOUNTER — Encounter (HOSPITAL_COMMUNITY): Payer: BLUE CROSS/BLUE SHIELD

## 2015-11-30 DIAGNOSIS — Z951 Presence of aortocoronary bypass graft: Secondary | ICD-10-CM | POA: Diagnosis not present

## 2015-11-30 DIAGNOSIS — I214 Non-ST elevation (NSTEMI) myocardial infarction: Secondary | ICD-10-CM | POA: Diagnosis not present

## 2015-12-03 ENCOUNTER — Encounter (HOSPITAL_COMMUNITY): Payer: BLUE CROSS/BLUE SHIELD

## 2015-12-03 ENCOUNTER — Encounter (HOSPITAL_COMMUNITY)
Admission: RE | Admit: 2015-12-03 | Discharge: 2015-12-03 | Disposition: A | Discharge status: Home / Self Care | Payer: BLUE CROSS/BLUE SHIELD | Source: Ambulatory Visit | Attending: Cardiovascular Disease | Admitting: Cardiovascular Disease

## 2015-12-03 DIAGNOSIS — I214 Non-ST elevation (NSTEMI) myocardial infarction: Secondary | ICD-10-CM

## 2015-12-03 DIAGNOSIS — Z951 Presence of aortocoronary bypass graft: Secondary | ICD-10-CM | POA: Diagnosis not present

## 2015-12-05 ENCOUNTER — Encounter (HOSPITAL_COMMUNITY)
Admission: RE | Admit: 2015-12-05 | Discharge: 2015-12-05 | Disposition: A | Discharge status: Home / Self Care | Payer: BLUE CROSS/BLUE SHIELD | Source: Ambulatory Visit | Attending: Cardiovascular Disease | Admitting: Cardiovascular Disease

## 2015-12-05 ENCOUNTER — Encounter (HOSPITAL_COMMUNITY): Payer: BLUE CROSS/BLUE SHIELD

## 2015-12-05 DIAGNOSIS — Z951 Presence of aortocoronary bypass graft: Secondary | ICD-10-CM

## 2015-12-05 DIAGNOSIS — I214 Non-ST elevation (NSTEMI) myocardial infarction: Secondary | ICD-10-CM

## 2015-12-07 ENCOUNTER — Telehealth (HOSPITAL_COMMUNITY): Payer: Self-pay | Admitting: *Deleted

## 2015-12-07 ENCOUNTER — Encounter (HOSPITAL_COMMUNITY): Admission: RE | Admit: 2015-12-07 | Payer: BLUE CROSS/BLUE SHIELD | Source: Ambulatory Visit

## 2015-12-07 ENCOUNTER — Encounter (HOSPITAL_COMMUNITY): Payer: BLUE CROSS/BLUE SHIELD

## 2015-12-07 NOTE — Telephone Encounter (Signed)
-----   Message from Kerin PernaPeter Van Trigt, MD sent at 12/06/2015  5:54 PM EDT ----- Regarding: RE: update on pt using weight He shoulld not lift anything heavy enough to cause pain in any part of his body  -ever ----- Message ----- From: Chelsea Ausarlette B Carlton, RN Sent: 12/05/2015   4:34 PM To: Kerin PernaPeter Van Trigt, MD Subject: update on pt using weight                      Dr. Morton PetersVan tright  Quick update on how the above patient is progressing in cardiac rehab.  Pt is cleared to return back to work on next week - September 6th with the expectation to lift 75 pounds.  Today per your recommendations as preparation to return to work, pt used 20 pound weights/15 reps and was able to complete exercise for legs  except for the seated chest press which caused pt sternal discomfort.  Pt felt sore afterward and needed to decrease his workloads due to soreness. Pt will return back to exercise on the day he is to return to work (9/6) due to the upcoming holiday.   Pt is very worried he will not be able to lift 75 pounds when he returns to work.  How should we proceed at this point?  Thanks for your input PepsiCoCarlette Carlton RN

## 2015-12-12 ENCOUNTER — Encounter (HOSPITAL_COMMUNITY)
Admission: RE | Admit: 2015-12-12 | Discharge: 2015-12-12 | Disposition: A | Discharge status: Home / Self Care | Payer: BLUE CROSS/BLUE SHIELD | Source: Ambulatory Visit | Attending: Cardiovascular Disease | Admitting: Cardiovascular Disease

## 2015-12-12 ENCOUNTER — Encounter (HOSPITAL_COMMUNITY): Payer: BLUE CROSS/BLUE SHIELD

## 2015-12-12 ENCOUNTER — Telehealth (HOSPITAL_COMMUNITY): Payer: Self-pay | Admitting: Cardiac Rehabilitation

## 2015-12-12 DIAGNOSIS — Z951 Presence of aortocoronary bypass graft: Secondary | ICD-10-CM | POA: Diagnosis not present

## 2015-12-12 DIAGNOSIS — I214 Non-ST elevation (NSTEMI) myocardial infarction: Secondary | ICD-10-CM | POA: Diagnosis not present

## 2015-12-12 NOTE — Telephone Encounter (Signed)
-----   Message from Kerin PernaPeter Van Trigt, MD sent at 12/10/2015 11:53 AM EDT ----- Regarding: RE: update on pt using weight  If it hurts he should not do it ----- Message ----- From: Chelsea Ausarlette B Carlton, RN Sent: 12/07/2015   3:19 PM To: Kerin PernaPeter Van Trigt, MD Subject: RE: update on pt using weight                  I agree.     What should pt do regarding the expectation to  to lift 75 pounds upon returning back to work per your written clearance?   Karlene Linemanarlette Carlton RN ----- Message ----- From: Kerin PernaPeter Van Trigt, MD Sent: 12/06/2015   5:54 PM To: Chelsea Ausarlette B Carlton, RN Subject: RE: update on pt using weight                  He shoulld not lift anything heavy enough to cause pain in any part of his body  -ever ----- Message ----- From: Chelsea Ausarlette B Carlton, RN Sent: 12/05/2015   4:34 PM To: Kerin PernaPeter Van Trigt, MD Subject: update on pt using weight                      Dr. Morton PetersVan tright  Quick update on how the above patient is progressing in cardiac rehab.  Pt is cleared to return back to work on next week - September 6th with the expectation to lift 75 pounds.  Today per your recommendations as preparation to return to work, pt used 20 pound weights/15 reps and was able to complete exercise for legs  except for the seated chest press which caused pt sternal discomfort.  Pt felt sore afterward and needed to decrease his workloads due to soreness. Pt will return back to exercise on the day he is to return to work (9/6) due to the upcoming holiday.   Pt is very worried he will not be able to lift 75 pounds when he returns to work.  How should we proceed at this point?  Thanks for your input [employer]lette Carlton RN

## 2015-12-14 ENCOUNTER — Encounter (HOSPITAL_COMMUNITY): Payer: BLUE CROSS/BLUE SHIELD

## 2015-12-14 ENCOUNTER — Encounter (HOSPITAL_COMMUNITY)
Admission: RE | Admit: 2015-12-14 | Discharge: 2015-12-14 | Disposition: A | Discharge status: Home / Self Care | Payer: BLUE CROSS/BLUE SHIELD | Source: Ambulatory Visit | Attending: Cardiovascular Disease | Admitting: Cardiovascular Disease

## 2015-12-14 DIAGNOSIS — E119 Type 2 diabetes mellitus without complications: Secondary | ICD-10-CM | POA: Diagnosis not present

## 2015-12-14 DIAGNOSIS — I214 Non-ST elevation (NSTEMI) myocardial infarction: Secondary | ICD-10-CM | POA: Diagnosis not present

## 2015-12-14 DIAGNOSIS — E785 Hyperlipidemia, unspecified: Secondary | ICD-10-CM | POA: Diagnosis not present

## 2015-12-14 DIAGNOSIS — Z951 Presence of aortocoronary bypass graft: Secondary | ICD-10-CM | POA: Diagnosis not present

## 2015-12-14 DIAGNOSIS — I2510 Atherosclerotic heart disease of native coronary artery without angina pectoris: Secondary | ICD-10-CM | POA: Diagnosis not present

## 2015-12-17 ENCOUNTER — Encounter (HOSPITAL_COMMUNITY)
Admission: RE | Admit: 2015-12-17 | Discharge: 2015-12-17 | Disposition: A | Discharge status: Home / Self Care | Payer: BLUE CROSS/BLUE SHIELD | Source: Ambulatory Visit | Attending: Cardiovascular Disease | Admitting: Cardiovascular Disease

## 2015-12-17 ENCOUNTER — Encounter (HOSPITAL_COMMUNITY): Payer: BLUE CROSS/BLUE SHIELD

## 2015-12-17 DIAGNOSIS — Z951 Presence of aortocoronary bypass graft: Secondary | ICD-10-CM

## 2015-12-17 DIAGNOSIS — I214 Non-ST elevation (NSTEMI) myocardial infarction: Secondary | ICD-10-CM

## 2015-12-18 ENCOUNTER — Telehealth (HOSPITAL_COMMUNITY): Payer: Self-pay | Admitting: Cardiac Rehabilitation

## 2015-12-18 NOTE — Telephone Encounter (Signed)
-----   Message from Jo Lene R Carter, RN sent at 12/17/2015 10:41 AM EDT ----- Per Dr. Van Trigt, he is to continue rehab and we will extend his work disability until he doesn't hurt anymore doing his rehab. Please keep us posted. We will see him in follow up on 02/16/16.....thanks, Jo Lene 

## 2015-12-19 ENCOUNTER — Encounter (HOSPITAL_COMMUNITY)
Admission: RE | Admit: 2015-12-19 | Discharge: 2015-12-19 | Disposition: A | Discharge status: Home / Self Care | Payer: BLUE CROSS/BLUE SHIELD | Source: Ambulatory Visit | Attending: Cardiovascular Disease | Admitting: Cardiovascular Disease

## 2015-12-19 ENCOUNTER — Encounter (HOSPITAL_COMMUNITY): Payer: BLUE CROSS/BLUE SHIELD

## 2015-12-19 DIAGNOSIS — I214 Non-ST elevation (NSTEMI) myocardial infarction: Secondary | ICD-10-CM | POA: Diagnosis not present

## 2015-12-19 DIAGNOSIS — Z951 Presence of aortocoronary bypass graft: Secondary | ICD-10-CM

## 2015-12-20 NOTE — Progress Notes (Signed)
Cardiac Individual Treatment Plan  Patient Details  Name: Justin Olson MRN: [id number] Date of Birth: October 14, 1965 Referring Provider:   Flowsheet Row CARDIAC REHAB PHASE II ORIENTATION from 11/13/2015 in [facility] CARD[facility]AC REHAB  Referring Provider  Nanetta Batty MD      Initial Encounter Date:  Flowsheet Row CARDIAC REHAB PHASE II ORIENTATION from 11/13/2015 in [facility] CARDIAC REHAB  Date  11/13/15  Referring Provider  Nanetta Batty MD      Visit Diagnosis: S/P CABG x 2  NSTEMI (non-ST elevated myocardial infarction) [facility])  Patient's Home Medications on Admission:  Current Outpatient Prescriptions:  .  aspirin 81 MG tablet, Take 81 mg by mouth daily., Disp: , Rfl:  .  atorvastatin (LIPITOR) 80 MG tablet, Take 1 tablet (80 mg total) by mouth daily at 6 PM., Disp: 30 tablet, Rfl: 1 .  Blood Glucose Monitoring Suppl (CONTOUR BLOOD GLUCOSE SYSTEM) DEVI, Inject 1 Device as directed 2 (two) times daily., Disp: 1 Device, Rfl: 0 .  gabapentin (NEURONTIN) 300 MG capsule, Take 1 capsule (300 mg total) by mouth 2 (two) times daily., Disp: 60 capsule, Rfl: 1 .  glucose blood (FREESTYLE LITE) test strip, Test once daily before breakfast, Disp: 50 each, Rfl: 12 .  Lancets (FREESTYLE) lancets, Use as instructed, Disp: 100 each, Rfl: 12 .  metFORMIN (GLUCOPHAGE) 500 MG tablet, Take 1 tablet (500 mg total) by mouth daily with breakfast., Disp: 30 tablet, Rfl: 1 .  metoprolol tartrate (LOPRESSOR) 25 MG tablet, Take 12.5 mg by mouth 2 (two) times daily., Disp: , Rfl:  .  nicotine (NICODERM CQ - DOSED IN MG/24 HR) 7 mg/24hr patch, Place 7 mg onto the skin daily., Disp: , Rfl:  .  oxyCODONE (OXY IR/ROXICODONE) 5 MG immediate release tablet, Take 1-2 tablets (5-10 mg total) by mouth every 4 (four) hours as needed for severe pain. (Patient not taking: Reported on 11/13/2015), Disp: 40 tablet, Rfl: 0 .  traMADol (ULTRAM) 50 MG tablet, Take 2 tablets (100 mg total) by  mouth every 6 (six) hours as needed., Disp: 40 tablet, Rfl: 0  Past Medical History: Past Medical History:  Diagnosis Date  . Coronary artery disease   . Diabetes mellitus type 2, noninsulin dependent (HCC) 08/2015  . Dyslipidemia associated with type 2 diabetes mellitus (HCC) 08/2015  . Tobacco abuse     Tobacco Use: History  Smoking Status  . Former Smoker  . Packs/day: 1.50  . Types: Cigarettes  . Quit date: 08/28/2015  Smokeless Tobacco  . Not on file    Labs: Recent Review Flowsheet Data    Labs for ITP Cardiac and Pulmonary Rehab Latest Ref Rng & Units 08/30/2015 08/30/2015 08/30/2015 08/30/2015 08/31/2015   Cholestrol 0 - 200 mg/dL - - - - -   LDLCALC 0 - 99 mg/dL - - - - -   HDL >40 mg/dL - - - - -   Trlycerides <150 mg/dL - - - - -   Hemoglobin A1c 4.8 - 5.6 % - - - - -   PHART 7.350 - 7.450 7.291(L) 7.334(L) 7.338(L) - -   PCO2ART 35.0 - 45.0 mmHg 47.0(H) 40.5 37.7 - -   HCO3 20.0 - 24.0 mEq/L 22.7 21.5 20.2 - -   TCO2 0 - 100 mmol/L 24 23 21 21 21    ACIDBASEDEF 0.0 - 2.0 mmol/L 4.0(H) 4.0(H) 5.0(H) - -   O2SAT % 93.0 97.0 89.0 - -      Capillary Blood Glucose:  Lab Results  Component Value Date   GLUCAP 132 (H) 11/23/2015   GLUCAP 194 (H) 11/23/2015   GLUCAP 118 (H) 11/21/2015   GLUCAP 212 (H) 11/21/2015   GLUCAP 120 (H) 11/19/2015     Exercise Target Goals:    Exercise Program Goal: Individual exercise prescription set with THRR, safety & activity barriers. Participant demonstrates ability to understand and report RPE using BORG scale, to self-measure pulse accurately, and to acknowledge the importance of the exercise prescription.  Exercise Prescription Goal: Starting with aerobic activity 30 plus minutes a day, 3 days per week for initial exercise prescription. Provide home exercise prescription and guidelines that participant acknowledges understanding prior to discharge.  Activity Barriers & Risk Stratification:     Activity Barriers & Cardiac  Risk Stratification - 11/13/15 1422      Activity Barriers & Cardiac Risk Stratification   Activity Barriers None   Cardiac Risk Stratification High      6 Minute Walk:     6 Minute Walk    Row Name 11/13/15 1623         6 Minute Walk   Phase Initial     Distance 1383 feet     Walk Time 6 minutes     # of Rest Breaks 0     MPH 2.62     METS 3.73     RPE 7     VO2 Peak 13.06     Symptoms No     Resting HR 64 bpm     Resting BP 104/60     Max Ex. HR 78 bpm     Max Ex. BP 112/72     2 Minute Post BP 108/60        Initial Exercise Prescription:     Initial Exercise Prescription - 11/13/15 1600      Date of Initial Exercise RX and Referring Provider   Date 11/13/15   Referring Provider Nanetta Batty MD     Treadmill   MPH 2.5   Grade 1   Minutes 10   METs 3.26     Bike   Level 1.2   Minutes 10   METs 3.75     NuStep   Level 3   Minutes 10   METs 2.8     Prescription Details   Frequency (times per week) 3   Duration Progress to 30 minutes of continuous aerobic without signs/symptoms of physical distress     Intensity   THRR 40-80% of Max Heartrate 68-136   Ratings of Perceived Exertion 11-13   Perceived Dyspnea 0-4     Progression   Progression Continue to progress workloads to maintain intensity without signs/symptoms of physical distress.     Resistance Training   Training Prescription Yes   Weight 2 lbs   Reps 10-12      Perform Capillary Blood Glucose checks as needed.  Exercise Prescription Changes:      Exercise Prescription Changes    Row Name 11/21/15 1700 11/28/15 0800 12/14/15 0800         Exercise Review   Progression Yes Yes Yes       Response to Exercise   Blood Pressure (Admit) 102/76 108/72 108/70     Blood Pressure (Exercise) 144/70 114/63 126/82     Blood Pressure (Exit) 114/70 100/64 106/60     Heart Rate (Admit) 63 bpm 93 bpm 67 bpm     Heart Rate (Exercise) 107 bpm 115 bpm 104 bpm  Heart Rate (Exit)  65 bpm 75 bpm 67 bpm     Rating of Perceived Exertion (Exercise) 11 11 13      Comments Reviewed home exercise guidelines on 11/21/15. Reviewed home exercise guidelines on 11/21/15. Reviewed home exercise guidelines on 11/21/15.     Duration Progress to 30 minutes of continuous aerobic without signs/symptoms of physical distress Progress to 30 minutes of continuous aerobic without signs/symptoms of physical distress Progress to 30 minutes of continuous aerobic without signs/symptoms of physical distress     Intensity THRR unchanged THRR unchanged THRR unchanged       Progression   Progression Continue to progress workloads to maintain intensity without signs/symptoms of physical distress. Continue to progress workloads to maintain intensity without signs/symptoms of physical distress. Continue to progress workloads to maintain intensity without signs/symptoms of physical distress.     Average METs 3.5 3.5 3.9       Resistance Training   Training Prescription Yes Yes Yes     Weight 2 lbs 2 lbs 2 lbs     Reps 10-12 10-12 10-12       Interval Training   Interval Training No No No       Treadmill   MPH 3 3 3      Grade 2 2 2      Minutes 10 10 10      METs 4.12 4.12 4.12       Bike   Level 1.5 1.5 1.5     Minutes 10 10 10      METs 4.44 4.44 4.44       NuStep   Level 4 4 4      Minutes 10 10 10      METs 2.6 2.9 3.4       Home Exercise Plan   Plans to continue exercise at Home Home Home     Frequency Add 4 additional days to program exercise sessions. Add 4 additional days to program exercise sessions. Add 4 additional days to program exercise sessions.        Exercise Comments:      Exercise Comments    Row Name 11/21/15 1049 11/28/15 0820 12/14/15 [id number]       Exercise Comments Reviewed home exercise guidelines. Increasing workloads consistently as tolerated. Reviewed METs with pt and pt is interested in strength training to assist with returning to work on December 12, 2015. Will  send request to Dr. Morton Peters Reviewed METs and goals. Pt is tolerating exercise well; will continue to monitor exercise progression        Discharge Exercise Prescription (Final Exercise Prescription Changes):     Exercise Prescription Changes - 12/14/15 0800      Exercise Review   Progression Yes     Response to Exercise   Blood Pressure (Admit) 108/70   Blood Pressure (Exercise) 126/82   Blood Pressure (Exit) 106/60   Heart Rate (Admit) 67 bpm   Heart Rate (Exercise) 104 bpm   Heart Rate (Exit) 67 bpm   Rating of Perceived Exertion (Exercise) 13   Comments Reviewed home exercise guidelines on 11/21/15.   Duration Progress to 30 minutes of continuous aerobic without signs/symptoms of physical distress   Intensity THRR unchanged     Progression   Progression Continue to progress workloads to maintain intensity without signs/symptoms of physical distress.   Average METs 3.9     Resistance Training   Training Prescription Yes   Weight 2 lbs   Reps 10-12     Interval  Training   Interval Training No     Treadmill   MPH 3   Grade 2   Minutes 10   METs 4.12     Bike   Level 1.5   Minutes 10   METs 4.44     NuStep   Level 4   Minutes 10   METs 3.4     Home Exercise Plan   Plans to continue exercise at Home   Frequency Add 4 additional days to program exercise sessions.      Nutrition:  Target Goals: Understanding of nutrition guidelines, daily intake of sodium 1500mg , cholesterol 200mg , calories 30% from fat and 7% or less from saturated fats, daily to have 5 or more servings of fruits and vegetables.  Biometrics:     Pre Biometrics - 11/13/15 1621      Pre Biometrics   Height 5' 6.5" (1.689 m)   Weight 181 lb (82.1 kg)   Waist Circumference 38.25 inches   Hip Circumference 39.5 inches   Waist to Hip Ratio 0.97 %   BMI (Calculated) 28.8   Triceps Skinfold 15 mm   % Body Fat 26.7 %   Grip Strength 43.5 kg   Flexibility 13 in   Single Leg Stand  30 seconds       Nutrition Therapy Plan and Nutrition Goals:     Nutrition Therapy & Goals - 11/19/15 1424      Nutrition Therapy   Diet Carb Modified, Therapeutic Lifestyle Changes     Personal Nutrition Goals   Personal Goal #1 1-2 lb wt loss/week to a goal wt loss of 6-24 lb at graduation from Cardiac Rehab   Personal Goal #2 Improved glycemic control as evidenced by an A1c decrease from 7.9 toward a goal of less than 7.0     Intervention Plan   Intervention Prescribe, educate and counsel regarding individualized specific dietary modifications aiming towards targeted core components such as weight, hypertension, lipid management, diabetes, heart failure and other comorbidities.   Expected Outcomes Short Term Goal: Understand basic principles of dietary content, such as calories, fat, sodium, cholesterol and nutrients.;Long Term Goal: Adherence to prescribed nutrition plan.      Nutrition Discharge: Nutrition Scores:     Nutrition Assessments - 11/23/15 1139      MEDFICTS Scores   Pre Score 42      Nutrition Goals Re-Evaluation:   Psychosocial: Target Goals: Acknowledge presence or absence of depression, maximize coping skills, provide positive support system. Participant is able to verbalize types and ability to use techniques and skills needed for reducing stress and depression.  Initial Review & Psychosocial Screening:     Initial Psych Review & Screening - 11/26/15 1532      Family Dynamics   Comments Pyschosocial Assessment reveals no needs at this time, no further intervention warranted      Quality of Life Scores:     Quality of Life - 11/13/15 1608      Quality of Life Scores   Health/Function Pre 26.4 %   Socioeconomic Pre 24.86 %   Psych/Spiritual Pre 27.43 %   Family Pre 30 %   GLOBAL Pre 26.82 %      PHQ-9: Recent Review Flowsheet Data    Depression screen [facility] 2/9 11/19/2015 09/26/2015   Decreased Interest 0 0   Down, Depressed, Hopeless  0 0   PHQ - 2 Score 0 0      Psychosocial Evaluation and Intervention:     Psychosocial Evaluation -  12/20/15 1449      Psychosocial Evaluation & Interventions   Interventions Encouraged to exercise with the program and follow exercise prescription   Comments No further psychosocial needed identified, no further intervention warranted.   Continued Psychosocial Services Needed No      Psychosocial Re-Evaluation:     Psychosocial Re-Evaluation    Row Name 11/22/15 [id number] 12/20/15 1449           Psychosocial Re-Evaluation   Interventions Encouraged to attend Cardiac Rehabilitation for the exercise Encouraged to attend Cardiac Rehabilitation for the exercise      Continued Psychosocial Services Needed No No         Vocational Rehabilitation: Provide vocational rehab assistance to qualifying candidates.   Vocational Rehab Evaluation & Intervention:     Vocational Rehab - 11/13/15 1607      Initial Vocational Rehab Evaluation & Intervention   Assessment shows need for Vocational Rehabilitation Yes   Vocational Rehab Packet given to patient 11/13/15      Education: Education Goals: Education classes will be provided on a weekly basis, covering required topics. Participant will state understanding/return demonstration of topics presented.  Learning Barriers/Preferences:     Learning Barriers/Preferences - 11/15/15 1238      Learning Barriers/Preferences   Learning Barriers --  Harvie HeckRandy has a 9th grade education      Education Topics: Count Your Pulse:  -Group instruction provided by verbal instruction, demonstration, patient participation and written materials to support subject.  Instructors address importance of being able to find your pulse and how to count your pulse when at home without a heart monitor.  Patients get hands on experience counting their pulse with staff help and individually.   Heart Attack, Angina, and Risk Factor Modification:  -Group  instruction provided by verbal instruction, video, and written materials to support subject.  Instructors address signs and symptoms of angina and heart attacks.    Also discuss risk factors for heart disease and how to make changes to improve heart health risk factors. Flowsheet Row [facility]ARDI[facility]  Date  11/28/15  Instruction Review Code  2- meets goals/outcomes      Functional Fitness:  -Group instruction provided by verbal instruction, demonstration, patient participation, and written materials to support subject.  Instructors address safety measures for doing things around the house.  Discuss how to get up and down off the floor, how to pick things up properly, how to safely get out of a chair without assistance, and balance training. Flowsheet Row CARDIAC REHAB PHASE [facility] EXERC[facility]  Date  11/23/15  Educator  Cristy Hiltslinty Richards EP  Instruction Review Code  2- meets goals/outcomes      Meditation and Mindfulness:  -Group instruction provided by verbal instruction, patient participation, and written materials to support subject.  Instructor addresses importance of mindfulness and meditation practice to help reduce stress and improve awareness.  Instructor also leads participants through a meditation exercise.    Stretching for Flexibility and Mobility:  -Group instruction provided by verbal instruction, patient participation, and written materials to support subject.  Instructors lead participants through series of stretches that are designed to increase flexibility thus improving mobility.  These stretches are additional exercise for major muscle groups that are typically performed during regular warm up and cool down.   Hands Only CPR Anytime:  -Group instruction provided by verbal instruction, video, patient participation and  written materials to support  subject.  Instructors co-teach with AHA video for hands only CPR.  Participants get hands on experience with mannequins.   Nutrition I class: Heart Healthy Eating:  -Group instruction provided by PowerPoint slides, verbal discussion, and written materials to support subject matter. The instructor gives an explanation and review of the Therapeutic Lifestyle Changes diet recommendations, which includes a discussion on lipid goals, dietary fat, sodium, fiber, plant stanol/sterol esters, sugar, and the components of a well-balanced, healthy diet.   Nutrition II class: Lifestyle Skills:  -Group instruction provided by PowerPoint slides, verbal discussion, and written materials to support subject matter. The instructor gives an explanation and review of label reading, grocery shopping for heart health, heart healthy recipe modifications, and ways to make healthier choices when eating out.   Diabetes Question & Answer:  -Group instruction provided by PowerPoint slides, verbal discussion, and written materials to support subject matter. The instructor gives an explanation and review of diabetes co-morbidities, pre- and post-prandial blood glucose goals, pre-exercise blood glucose goals, signs, symptoms, and treatment of hypoglycemia and hyperglycemia, and foot care basics. Flowsheet Row CARDIAC REHAB PHASE II EXERCISE from 12/19/2015 in [facility] CARDIAC REHAB  Date  12/14/15  Educator  RD  Instruction Review Code  2- meets goals/outcomes      Diabetes Blitz:  -Group instruction provided by PowerPoint slides, verbal discussion, and written materials to support subject matter. The instructor gives an explanation and review of the physiology behind type 1 and type 2 diabetes, diabetes medications and rational behind using different medications, pre- and post-prandial blood glucose recommendations and Hemoglobin A1c goals, diabetes diet, and exercise including blood glucose guidelines for  exercising safely.    Portion Distortion:  -Group instruction provided by PowerPoint slides, verbal discussion, written materials, and food models to support subject matter. The instructor gives an explanation of serving size versus portion size, changes in portions sizes over the last 20 years, and what consists of a serving from each food group.   Stress Management:  -Group instruction provided by verbal instruction, video, and written materials to support subject matter.  Instructors review role of stress in heart disease and how to cope with stress positively.     Exercising on Your Own:  -Group instruction provided by verbal instruction, power point, and written materials to support subject.  Instructors discuss benefits of exercise, components of exercise, frequency and intensity of exercise, and end points for exercise.  Also discuss use of nitroglycerin and activating EMS.  Review options of places to exercise outside of rehab.  Review guidelines for sex with heart disease.   Cardiac Drugs I:  -Group instruction provided by verbal instruction and written materials to support subject.  Instructor reviews cardiac drug classes: antiplatelets, anticoagulants, beta blockers, and statins.  Instructor discusses reasons, side effects, and lifestyle considerations for each drug class.   Cardiac Drugs II:  -Group instruction provided by verbal instruction and written materials to support subject.  Instructor reviews cardiac drug classes: angiotensin converting enzyme inhibitors (ACE-I), angiotensin II receptor blockers (ARBs), nitrates, and calcium channel blockers.  Instructor discusses reasons, side effects, and lifestyle considerations for each drug class. Flowsheet Row CARDIAC REHAB PHASE II EXERCISE from 12/19/2015 in [facility] CARDIAC REHAB  Date  12/19/15  Instruction Review Code  2- meets goals/outcomes      Anatomy and Physiology of the Circulatory System:  -Group  instruction provided by verbal instruction, video, and written materials to support subject.  Reviews functional anatomy of heart, how it relates to various diagnoses, and what role the heart plays in the overall system.   Knowledge Questionnaire Score:     Knowledge Questionnaire Score - 11/19/15 1424      Knowledge Questionnaire Score   Pre Score 19/24     DM 14/15      Core Components/Risk Factors/Patient Goals at Admission:     Personal Goals and Risk Factors at Admission - 11/13/15 1628      Core Components/Risk Factors/Patient Goals on Admission    Weight Management Weight Loss;Yes   Intervention Weight Management: Provide education and appropriate resources to help participant work on and attain dietary goals.;Weight Management: Develop a combined nutrition and exercise program designed to reach desired caloric intake, while maintaining appropriate intake of nutrient and fiber, sodium and fats, and appropriate energy expenditure required for the weight goal.   Admit Weight 181 lb (82.1 kg)   Goal Weight: Short Term 175 lb (79.4 kg)   Goal Weight: Long Term 170 lb (77.1 kg)   Expected Outcomes Short Term: Continue to assess and modify interventions until short term weight is achieved;Long Term: Adherence to nutrition and physical activity/exercise program aimed toward attainment of established weight goal   Tobacco Cessation Yes   Number of packs per day 1   Intervention Offer self-teaching materials, assist with locating and accessing local/national Quit Smoking programs, and support quit date choice.   Expected Outcomes Long Term: Complete abstinence from all tobacco products for at least 12 months from quit date.;Short Term: Will quit all tobacco product use, adhering to prevention of relapse plan.   Diabetes Yes   Intervention Provide education about proper nutrition, including hydration, and aerobic/resistive exercise prescription along with prescribed medications to achieve  blood glucose in normal ranges: Fasting glucose 65-99 mg/dL;Provide education about signs/symptoms and action to take for hypo/hyperglycemia.   Expected Outcomes Short Term: Participant verbalizes understanding of the signs/symptoms and immediate care of hyper/hypoglycemia, proper foot care and importance of medication, aerobic/resistive exercise and nutrition plan for blood glucose control.;Long Term: Attainment of HbA1C < 7%.   Personal Goal Other Yes   Personal Goal Eat healthy. Chest stronger.   Intervention Provide education about heart healthy eating.   Expected Outcomes Adherence to heart healthy diet guidelines provided by dietician.      Core Components/Risk Factors/Patient Goals Review:      Goals and Risk Factor Review    Row Name 11/21/15 1050 12/14/15 0824           Core Components/Risk Factors/Patient Goals Review   Personal Goals Review (P)  Other Increase Strength and Stamina;Other      Review (P)  Patient states he's making healthier eating choices, which is one of his personal goals. Pt feels chest is healing well and is getting stronger. Pt returned to work on 12/12/15. Started strength training in [facility]. Pt is currently lifting 20-30lbs on pulley system      Expected Outcomes (P)  Patient will continue to make heart healthy eating. Increase workloads as tolerated to help achieve weight loss goals. Pt will continue to get stronger in order to perform duties at work         Core Components/Risk Factors/Patient Goals at Discharge (Final Review):      Goals and Risk Factor Review - 12/14/15 0824      Core Components/Risk Factors/Patient Goals Review   Personal Goals Review Increase Strength and Stamina;Other   Review Pt returned to work on 12/12/15.  Started strength training in [facility]. Pt is currently lifting 20-30lbs on pulley system   Expected Outcomes Pt will continue to get stronger in order to perform duties at work      ITP Comments:     ITP Comments    Row  Name 11/15/15 1224           ITP Comments Dr Armanda Magic is the medical director of cardiac rehab          Comments:  Pt is making expected progress toward personal goals after completing  13 sessions. Recommend continued exercise and life style modification education including  stress management and relaxation techniques to decrease cardiac risk profile. Pt recently went back to work but due to increase soreness and the necessity to lift 75 pounds the clearance to return to work was rescinded. Pt with less worry regarding attempting to work and fearful of causing injury to his recovery. Pt is seeking long term disability.  Added weights to pt regiment and will gradually increase them as tolerated.  Pt presently at 20 pounds.  Pt with supportive wife. Psychosocial Assessment remains unchanged from previous assessment. No needs identified. No further intervention warranted at this time. Alanson Aly, BSN

## 2015-12-21 ENCOUNTER — Encounter (HOSPITAL_COMMUNITY)
Admission: RE | Admit: 2015-12-21 | Discharge: 2015-12-21 | Disposition: A | Discharge status: Home / Self Care | Payer: BLUE CROSS/BLUE SHIELD | Source: Ambulatory Visit | Attending: Cardiovascular Disease | Admitting: Cardiovascular Disease

## 2015-12-21 ENCOUNTER — Encounter (HOSPITAL_COMMUNITY): Payer: BLUE CROSS/BLUE SHIELD

## 2015-12-21 DIAGNOSIS — I214 Non-ST elevation (NSTEMI) myocardial infarction: Secondary | ICD-10-CM | POA: Diagnosis not present

## 2015-12-21 DIAGNOSIS — Z951 Presence of aortocoronary bypass graft: Secondary | ICD-10-CM

## 2015-12-24 ENCOUNTER — Encounter (HOSPITAL_COMMUNITY): Payer: BLUE CROSS/BLUE SHIELD

## 2015-12-24 ENCOUNTER — Encounter (HOSPITAL_COMMUNITY)
Admission: RE | Admit: 2015-12-24 | Discharge: 2015-12-24 | Disposition: A | Discharge status: Home / Self Care | Payer: BLUE CROSS/BLUE SHIELD | Source: Ambulatory Visit | Attending: Cardiovascular Disease | Admitting: Cardiovascular Disease

## 2015-12-24 DIAGNOSIS — I214 Non-ST elevation (NSTEMI) myocardial infarction: Secondary | ICD-10-CM | POA: Diagnosis not present

## 2015-12-24 DIAGNOSIS — Z951 Presence of aortocoronary bypass graft: Secondary | ICD-10-CM | POA: Diagnosis not present

## 2015-12-26 ENCOUNTER — Telehealth (HOSPITAL_COMMUNITY): Payer: Self-pay | Admitting: Cardiac Rehabilitation

## 2015-12-26 ENCOUNTER — Encounter (HOSPITAL_COMMUNITY): Payer: BLUE CROSS/BLUE SHIELD

## 2015-12-26 ENCOUNTER — Encounter (HOSPITAL_COMMUNITY)
Admission: RE | Admit: 2015-12-26 | Discharge: 2015-12-26 | Disposition: A | Discharge status: Home / Self Care | Payer: BLUE CROSS/BLUE SHIELD | Source: Ambulatory Visit | Attending: Cardiovascular Disease | Admitting: Cardiovascular Disease

## 2015-12-26 DIAGNOSIS — I214 Non-ST elevation (NSTEMI) myocardial infarction: Secondary | ICD-10-CM

## 2015-12-26 DIAGNOSIS — Z951 Presence of aortocoronary bypass graft: Secondary | ICD-10-CM

## 2015-12-26 NOTE — Telephone Encounter (Signed)
-----   Message from Particia NearingJo Lene R Carter, RN sent at 12/17/2015 10:41 AM EDT ----- Per Dr. Donata ClayVan Trigt, he is to continue rehab and we will extend his work disability until he doesn't hurt anymore doing his rehab. Please keep us posted. We will see him in follow up on 02/16/16.....thanks, Evaristo BuryJo Lene

## 2015-12-28 ENCOUNTER — Encounter (HOSPITAL_COMMUNITY)
Admission: RE | Admit: 2015-12-28 | Discharge: 2015-12-28 | Disposition: A | Discharge status: Home / Self Care | Payer: BLUE CROSS/BLUE SHIELD | Source: Ambulatory Visit | Attending: Cardiovascular Disease | Admitting: Cardiovascular Disease

## 2015-12-28 ENCOUNTER — Encounter (HOSPITAL_COMMUNITY): Payer: BLUE CROSS/BLUE SHIELD

## 2015-12-28 DIAGNOSIS — I214 Non-ST elevation (NSTEMI) myocardial infarction: Secondary | ICD-10-CM | POA: Diagnosis not present

## 2015-12-28 DIAGNOSIS — Z951 Presence of aortocoronary bypass graft: Secondary | ICD-10-CM

## 2015-12-31 ENCOUNTER — Encounter (HOSPITAL_COMMUNITY): Payer: BLUE CROSS/BLUE SHIELD

## 2015-12-31 ENCOUNTER — Encounter (HOSPITAL_COMMUNITY)
Admission: RE | Admit: 2015-12-31 | Discharge: 2015-12-31 | Disposition: A | Discharge status: Home / Self Care | Payer: BLUE CROSS/BLUE SHIELD | Source: Ambulatory Visit | Attending: Cardiovascular Disease | Admitting: Cardiovascular Disease

## 2015-12-31 DIAGNOSIS — Z951 Presence of aortocoronary bypass graft: Secondary | ICD-10-CM | POA: Diagnosis not present

## 2015-12-31 DIAGNOSIS — I214 Non-ST elevation (NSTEMI) myocardial infarction: Secondary | ICD-10-CM | POA: Diagnosis not present

## 2016-01-02 ENCOUNTER — Encounter (HOSPITAL_COMMUNITY)
Admission: RE | Admit: 2016-01-02 | Discharge: 2016-01-02 | Disposition: A | Discharge status: Home / Self Care | Payer: BLUE CROSS/BLUE SHIELD | Source: Ambulatory Visit | Attending: Cardiovascular Disease | Admitting: Cardiovascular Disease

## 2016-01-02 ENCOUNTER — Encounter (HOSPITAL_COMMUNITY): Payer: BLUE CROSS/BLUE SHIELD

## 2016-01-02 DIAGNOSIS — Z951 Presence of aortocoronary bypass graft: Secondary | ICD-10-CM | POA: Diagnosis not present

## 2016-01-02 DIAGNOSIS — I214 Non-ST elevation (NSTEMI) myocardial infarction: Secondary | ICD-10-CM

## 2016-01-04 ENCOUNTER — Encounter (HOSPITAL_COMMUNITY)
Admission: RE | Admit: 2016-01-04 | Discharge: 2016-01-04 | Disposition: A | Discharge status: Home / Self Care | Payer: BLUE CROSS/BLUE SHIELD | Source: Ambulatory Visit | Attending: Cardiovascular Disease | Admitting: Cardiovascular Disease

## 2016-01-04 ENCOUNTER — Encounter (HOSPITAL_COMMUNITY): Payer: BLUE CROSS/BLUE SHIELD

## 2016-01-04 DIAGNOSIS — Z951 Presence of aortocoronary bypass graft: Secondary | ICD-10-CM | POA: Diagnosis not present

## 2016-01-04 DIAGNOSIS — I214 Non-ST elevation (NSTEMI) myocardial infarction: Secondary | ICD-10-CM | POA: Diagnosis not present

## 2016-01-07 ENCOUNTER — Encounter (HOSPITAL_COMMUNITY): Payer: BLUE CROSS/BLUE SHIELD

## 2016-01-09 ENCOUNTER — Encounter (HOSPITAL_COMMUNITY): Payer: BLUE CROSS/BLUE SHIELD

## 2016-01-11 ENCOUNTER — Encounter (HOSPITAL_COMMUNITY): Payer: BLUE CROSS/BLUE SHIELD

## 2016-01-14 ENCOUNTER — Encounter (HOSPITAL_COMMUNITY)
Admission: RE | Admit: 2016-01-14 | Discharge: 2016-01-14 | Disposition: A | Discharge status: Home / Self Care | Payer: BLUE CROSS/BLUE SHIELD | Source: Ambulatory Visit | Attending: Cardiovascular Disease | Admitting: Cardiovascular Disease

## 2016-01-14 ENCOUNTER — Encounter (HOSPITAL_COMMUNITY): Payer: BLUE CROSS/BLUE SHIELD

## 2016-01-14 DIAGNOSIS — Z951 Presence of aortocoronary bypass graft: Secondary | ICD-10-CM | POA: Diagnosis not present

## 2016-01-14 DIAGNOSIS — I214 Non-ST elevation (NSTEMI) myocardial infarction: Secondary | ICD-10-CM | POA: Diagnosis not present

## 2016-01-14 NOTE — Progress Notes (Signed)
Pt returned to exercise.  Pt out last week for trip to the beach.  Pt resumed with weight training. Pt was able to increase his weight and repetitions. Alanson Alyarlette Leul Narramore RN, BSN

## 2016-01-16 ENCOUNTER — Other Ambulatory Visit: Payer: Self-pay | Admitting: Cardiothoracic Surgery

## 2016-01-16 ENCOUNTER — Ambulatory Visit (INDEPENDENT_AMBULATORY_CARE_PROVIDER_SITE_OTHER): Payer: BLUE CROSS/BLUE SHIELD | Admitting: Cardiothoracic Surgery

## 2016-01-16 ENCOUNTER — Encounter (HOSPITAL_COMMUNITY): Payer: BLUE CROSS/BLUE SHIELD

## 2016-01-16 ENCOUNTER — Ambulatory Visit
Admission: RE | Admit: 2016-01-16 | Discharge: 2016-01-16 | Disposition: A | Discharge status: Home / Self Care | Payer: BLUE CROSS/BLUE SHIELD | Source: Ambulatory Visit | Attending: Cardiothoracic Surgery | Admitting: Cardiothoracic Surgery

## 2016-01-16 ENCOUNTER — Encounter (HOSPITAL_COMMUNITY)
Admission: RE | Admit: 2016-01-16 | Discharge: 2016-01-16 | Disposition: A | Discharge status: Home / Self Care | Payer: BLUE CROSS/BLUE SHIELD | Source: Ambulatory Visit | Attending: Cardiovascular Disease | Admitting: Cardiovascular Disease

## 2016-01-16 ENCOUNTER — Encounter: Payer: Self-pay | Admitting: Cardiothoracic Surgery

## 2016-01-16 ENCOUNTER — Ambulatory Visit: Payer: BLUE CROSS/BLUE SHIELD | Admitting: Cardiothoracic Surgery

## 2016-01-16 VITALS — BP 115/72 | HR 60 | Resp 16 | Ht 65.0 in | Wt 185.2 lb

## 2016-01-16 DIAGNOSIS — M792 Neuralgia and neuritis, unspecified: Secondary | ICD-10-CM | POA: Diagnosis not present

## 2016-01-16 DIAGNOSIS — I214 Non-ST elevation (NSTEMI) myocardial infarction: Secondary | ICD-10-CM

## 2016-01-16 DIAGNOSIS — S42019P Nondisplaced fracture of sternal end of unspecified clavicle, subsequent encounter for fracture with malunion: Secondary | ICD-10-CM

## 2016-01-16 DIAGNOSIS — J439 Emphysema, unspecified: Secondary | ICD-10-CM | POA: Diagnosis not present

## 2016-01-16 DIAGNOSIS — Z951 Presence of aortocoronary bypass graft: Secondary | ICD-10-CM | POA: Diagnosis not present

## 2016-01-16 MED ORDER — IOPAMIDOL (ISOVUE-300) INJECTION 61%
75.0000 mL | Freq: Once | INTRAVENOUS | Status: AC | PRN
  Administered 2016-01-16: 75 mL via INTRAVENOUS

## 2016-01-16 NOTE — Progress Notes (Signed)
PCP is Redmond BasemanWONG,FRANCIS PATRICK, MD Referring Provider is Runell GessBerry, Jonathan J, MD  Chief Complaint  Patient presents with  . Routine Post Op    f/u chest discomfort s/p CABG...discuss RTW    HPI: Further follow-up of left chest wall pain and discomfort after multivessel CABG  lateMay 2017. The patient persists in having left-sided hypersensitivity of his chest. When he raises his left arm over his head he has significant sharp pain along the third rib. There is no associated clicking or popping. He is able to tolerate normal aerobic activity at the cardiac rehabilitation program-treadmill, stationary bike. When he lifts 40 pounds with his arms upward he has significant left sided  chest pain. Chest x-ray shows sternal wires in good alignment. There is no pleural effusion. He denies any symptoms of angina. He has tried Voltaren cream. He has tried Neurontin. He's had no significant improvement. 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  We'll proceed with a prednisone taper for inflammation of the chest wall We'll check CT scan of chest to rule out male union of the sternal incision   Past Medical History:  Diagnosis Date  . Coronary artery disease   . Diabetes mellitus type 2, noninsulin dependent (HCC) 08/2015  . Dyslipidemia associated with type 2 diabetes mellitus (HCC) 08/2015  . Tobacco abuse     Past Surgical History:  Procedure Laterality Date  . CARDIAC CATHETERIZATION N/A 08/28/2015   Procedure: Left Heart Cath and Coronary Angiography;  Surgeon: Runell GessJonathan J Berry, MD;  [facility];  Service: Cardiovascular;  Laterality: N/A;  . CORONARY ARTERY BYPASS GRAFT N/A 08/30/2015   Procedure: CORONARY ARTERY BYPASS GRAFTING (CABG) x2 using left internal mammary artery and right greater saphenous vein. ;  Surgeon: Kerin PernaPeter Van Trigt, MD;  LIMA-LAD, SVG-RI  . TEE WITHOUT CARDIOVERSION N/A 08/30/2015   Procedure:  TRANSESOPHAGEAL ECHOCARDIOGRAM (TEE);  Surgeon: Kerin PernaPeter Van Trigt, MD;  [facility];  Service: Open Heart Surgery;  Laterality: N/A;    Family History  Problem Relation Age of Onset  . Coronary artery disease Father     MI  . Coronary artery disease Sister     s/p CABG x5    Social History Social History  Substance Use Topics  . Smoking status: Former Smoker    Packs/day: 1.50    Types: Cigarettes    Quit date: 08/28/2015  . Smokeless tobacco: Not on file  . Alcohol use 0.0 oz/week     Comment: only weekends 3-4 beers entire weekend    Current Outpatient Prescriptions  Medication Sig Dispense Refill  . aspirin 81 MG tablet Take 81 mg by mouth daily.    Marland Kitchen. atorvastatin (LIPITOR) 80 MG tablet Take 1 tablet (80 mg total) by mouth daily at 6 PM. 30 tablet 1  . Blood Glucose Monitoring Suppl (CONTOUR BLOOD GLUCOSE SYSTEM) DEVI Inject 1 Device as directed 2 (two) times daily. 1 Device 0  . glucose blood (FREESTYLE LITE) test strip Test once daily before breakfast 50 each 12  . Lancets (FREESTYLE) lancets Use as instructed 100 each 12  . metFORMIN (GLUCOPHAGE) 500 MG tablet Take 1 tablet (500 mg total) by mouth daily with breakfast. (Patient taking differently: Take 500 mg by mouth 2 (two) times daily with a meal. ) 30 tablet 1  . metoprolol tartrate (LOPRESSOR) 25 MG tablet Take 12.5 mg by mouth 2 (two) times daily.    . traMADol (  ULTRAM) 50 MG tablet Take 2 tablets (100 mg total) by mouth every 6 (six) hours as needed. 40 tablet 0   No current facility-administered medications for this visit.     No Known Allergies  Review of Systems  No angina no orthopnea No ankle edema   BP 115/72   Pulse 60   Resp 16   Ht 5\' 5"  (1.651 m)   Wt 185 lb 3 oz (84 kg)   SpO2 98% Comment: ON RA  BMI 30.82 kg/m  Physical Exam      Exam    General- alert and comfortable   Lungs- clear without rales, wheezes Chest with tenderness at the left costochondral region approximate third  interspace. No instability elicited. Surgical incision well-healed.   Cor- regular rate and rhythm, no murmur , gallop   Abdomen- soft, non-tender   Extremities - warm, non-tender, minimal edema   Neuro- oriented, appropriate, no focal weakness   Diagnostic Tests: None  Impression: Prednisone taper CT scan of chest  Plan: Return to office 1 week to review his chest wall pain.  Mikey Bussing, MD [facility] [phone]

## 2016-01-17 NOTE — Progress Notes (Signed)
Cardiac Individual Treatment Plan  Patient Details  Name: Justin Olson MRN: [id number] Date of Birth: July 15, 1965 Referring Provider:   Flowsheet Row CARDIAC REHAB PHASE II ORIENTATION from 11/13/2015 in [facility] CARDIAC REHAB  Referring Provider  Nanetta Batty MD      Initial Encounter Date:  Flowsheet Row CARDIAC REHAB PHASE II ORIENTATION from 11/13/2015 in [facility] CARDIAC REHAB  Date  11/13/15  Referring Provider  Nanetta Batty MD      Visit Diagnosis: S/P CABG x 2  NSTEMI (non-ST elevated myocardial infarction) [facility])  Patient's Home Medications on Admission:  Current Outpatient Prescriptions:  .  aspirin 81 MG tablet, Take 81 mg by mouth daily., Disp: , Rfl:  .  atorvastatin (LIPITOR) 80 MG tablet, Take 1 tablet (80 mg total) by mouth daily at 6 PM., Disp: 30 tablet, Rfl: 1 .  Blood Glucose Monitoring Suppl (CONTOUR BLOOD GLUCOSE SYSTEM) DEVI, Inject 1 Device as directed 2 (two) times daily., Disp: 1 Device, Rfl: 0 .  glucose blood (FREESTYLE LITE) test strip, Test once daily before breakfast, Disp: 50 each, Rfl: 12 .  Lancets (FREESTYLE) lancets, Use as instructed, Disp: 100 each, Rfl: 12 .  metFORMIN (GLUCOPHAGE) 500 MG tablet, Take 1 tablet (500 mg total) by mouth daily with breakfast. (Patient taking differently: Take 500 mg by mouth 2 (two) times daily with a meal. ), Disp: 30 tablet, Rfl: 1 .  metoprolol tartrate (LOPRESSOR) 25 MG tablet, Take 12.5 mg by mouth 2 (two) times daily., Disp: , Rfl:  .  traMADol (ULTRAM) 50 MG tablet, Take 2 tablets (100 mg total) by mouth every 6 (six) hours as needed., Disp: 40 tablet, Rfl: 0  Past Medical History: Past Medical History:  Diagnosis Date  . Coronary artery disease   . Diabetes mellitus type 2, noninsulin dependent (HCC) 08/2015  . Dyslipidemia associated with type 2 diabetes mellitus (HCC) 08/2015  . Tobacco abuse     Tobacco Use: History  Smoking Status  . Former Smoker  .  Packs/day: 1.50  . Types: Cigarettes  . Quit date: 08/28/2015  Smokeless Tobacco  . Not on file    Labs: Recent Review Flowsheet Data    Labs for ITP Cardiac and Pulmonary Rehab Latest Ref Rng & Units 08/30/2015 08/30/2015 08/30/2015 08/30/2015 08/31/2015   Cholestrol 0 - 200 mg/dL - - - - -   LDLCALC 0 - 99 mg/dL - - - - -   HDL >40 mg/dL - - - - -   Trlycerides <150 mg/dL - - - - -   Hemoglobin A1c 4.8 - 5.6 % - - - - -   PHART 7.350 - 7.450 7.291(L) 7.334(L) 7.338(L) - -   PCO2ART 35.0 - 45.0 mmHg 47.0(H) 40.5 37.7 - -   HCO3 20.0 - 24.0 mEq/L 22.7 21.5 20.2 - -   TCO2 0 - 100 mmol/L 24 23 21 21 21    ACIDBASEDEF 0.0 - 2.0 mmol/L 4.0(H) 4.0(H) 5.0(H) - -   O2SAT % 93.0 97.0 89.0 - -      Capillary Blood Glucose: Lab Results  Component Value Date   GLUCAP 132 (H) 11/23/2015   GLUCAP 194 (H) 11/23/2015   GLUCAP 118 (H) 11/21/2015   GLUCAP 212 (H) 11/21/2015   GLUCAP 120 (H) 11/19/2015     Exercise Target Goals:    Exercise Program Goal: Individual exercise prescription set with THRR, safety & activity barriers. Participant demonstrates ability to understand and report RPE using BORG scale,  to self-measure pulse accurately, and to acknowledge the importance of the exercise prescription.  Exercise Prescription Goal: Starting with aerobic activity 30 plus minutes a day, 3 days per week for initial exercise prescription. Provide home exercise prescription and guidelines that participant acknowledges understanding prior to discharge.  Activity Barriers & Risk Stratification:     Activity Barriers & Cardiac Risk Stratification - 11/13/15 1422      Activity Barriers & Cardiac Risk Stratification   Activity Barriers None   Cardiac Risk Stratification High      6 Minute Walk:     6 Minute Walk    Row Name 11/13/15 1623         6 Minute Walk   Phase Initial     Distance 1383 feet     Walk Time 6 minutes     # of Rest Breaks 0     MPH 2.62     METS 3.73     RPE 7      VO2 Peak 13.06     Symptoms No     Resting HR 64 bpm     Resting BP 104/60     Max Ex. HR 78 bpm     Max Ex. BP 112/72     2 Minute Post BP 108/60        Initial Exercise Prescription:     Initial Exercise Prescription - 11/13/15 1600      Date of Initial Exercise RX and Referring Provider   Date 11/13/15   Referring Provider [name], Jonathan MD     Treadmill   MPH 2.5   Grade 1   Minutes 10   METs 3.26     Bike   Level 1.2   Minutes 10   METs 3.75     NuStep   Level 3   Minutes 10   METs 2.8     Prescription Details   Frequency (times per week) 3   Duration Progress to 30 minutes of continuous aerobic without signs/symptoms of physical distress     Intensity   THRR 40-80% of Max Heartrate 68-136   Ratings of Perceived Exertion 11-13   Perceived Dyspnea 0-4     Progression   Progression Continue to progress workloads to maintain intensity without signs/symptoms of physical distress.     Resistance Training   Training Prescription Yes   Weight 2 lbs   Reps 10-12      Perform Capillary Blood Glucose checks as needed.  Exercise Prescription Changes:     Exercise Prescription Changes    Row Name 11/21/15 1700 11/28/15 0800 12/14/15 0800 01/09/16 1000 01/16/16 0800     Exercise Review   Progression Yes Yes Yes Yes Yes     Response to Exercise   Blood Pressure (Admit) 102/76 108/72 108/70 112/60 102/68   Blood Pressure (Exercise) 144/70 114/63 126/82 120/64 144/70   Blood Pressure (Exit) 114/70 100/64 106/60 102/64 100/70   Heart Rate (Admit) 63 bpm 93 bpm 67 bpm 61 bpm 79 bpm   Heart Rate (Exercise) 107 bpm 115 bpm 104 bpm 107 bpm 105 bpm   Heart Rate (Exit) 65 bpm 75 bpm 67 bpm 57 bpm 79 bpm   Rating of Perceived Exertion (Exercise) 11 11 13 11 14    Comments Reviewed home exercise guidelines on 11/21/15. Reviewed home exercise guidelines on 11/21/15. Reviewed home exercise guidelines on 11/21/15. Reviewed home exercise guidelines on 11/21/15. pt was  oriented to weighs for resistance training on 12/26/15  Duration Progress to 30 minutes of continuous aerobic without signs/symptoms of physical distress Progress to 30 minutes of continuous aerobic without signs/symptoms of physical distress Progress to 30 minutes of continuous aerobic without signs/symptoms of physical distress Progress to 30 minutes of continuous aerobic without signs/symptoms of physical distress Progress to 30 minutes of continuous aerobic without signs/symptoms of physical distress   Intensity THRR unchanged THRR unchanged THRR unchanged THRR unchanged THRR unchanged     Progression   Progression Continue to progress workloads to maintain intensity without signs/symptoms of physical distress. Continue to progress workloads to maintain intensity without signs/symptoms of physical distress. Continue to progress workloads to maintain intensity without signs/symptoms of physical distress. Continue to progress workloads to maintain intensity without signs/symptoms of physical distress. Continue to progress workloads to maintain intensity without signs/symptoms of physical distress.   Average METs 3.5 3.5 3.9 4.5 4.9     Resistance Training   Training Prescription Yes Yes Yes Yes Yes   Weight 2 lbs 2 lbs 2 lbs 5lbs 5lbs   Reps 10-12 10-12 10-12 10-12 10-12     Interval Training   Interval Training No No No No No     Treadmill   MPH 3 3 3  3.4 3.4   Grade 2 2 2 4 4    Minutes 10 10 10 10 10    METs 4.12 4.12 4.12 5.48 5.48     Bike   Level 1.5 1.5 1.5 1.5 1.5   Minutes 10 10 10 10 10    METs 4.44 4.44 4.44 4.44 4.44     NuStep   Level 4 4 4 4 4    Minutes 10 10 10 10 10    METs 2.6 2.9 3.4 3.5 3.5     Home Exercise Plan   Plans to continue exercise at Home Home Home Home Home   Frequency Add 4 additional days to program exercise sessions. Add 4 additional days to program exercise sessions. Add 4 additional days to program exercise sessions. Add 4 additional days to program  exercise sessions. Add 4 additional days to program exercise sessions.      Exercise Comments:     Exercise Comments    Row Name 11/21/15 1049 11/28/15 0820 12/14/15 1610       Exercise Comments Reviewed home exercise guidelines. Increasing workloads consistently as tolerated. Reviewed METs with pt and pt is interested in strength training to assist with returning to work on December 12, 2015. Will send request to Dr. Morton Peters Reviewed METs and goals. Pt is tolerating exercise well; will continue to monitor exercise progression        Discharge Exercise Prescription (Final Exercise Prescription Changes):     Exercise Prescription Changes - 01/16/16 0800      Exercise Review   Progression Yes     Response to Exercise   Blood Pressure (Admit) 102/68   Blood Pressure (Exercise) 144/70   Blood Pressure (Exit) 100/70   Heart Rate (Admit) 79 bpm   Heart Rate (Exercise) 105 bpm   Heart Rate (Exit) 79 bpm   Rating of Perceived Exertion (Exercise) 14   Comments pt was oriented to weighs for resistance training on 12/26/15   Duration Progress to 30 minutes of continuous aerobic without signs/symptoms of physical distress   Intensity THRR unchanged     Progression   Progression Continue to progress workloads to maintain intensity without signs/symptoms of physical distress.   Average METs 4.9     Resistance Training   Training Prescription Yes  Weight 5lbs   Reps 10-12     Interval Training   Interval Training No     Treadmill   MPH 3.4   Grade 4   Minutes 10   METs 5.48     Bike   Level 1.5   Minutes 10   METs 4.44     NuStep   Level 4   Minutes 10   METs 3.5     Home Exercise Plan   Plans to continue exercise at Home   Frequency Add 4 additional days to program exercise sessions.      Nutrition:  Target Goals: Understanding of nutrition guidelines, daily intake of sodium 1500mg , cholesterol 200mg , calories 30% from fat and 7% or less from saturated  fats, daily to have 5 or more servings of fruits and vegetables.  Biometrics:     Pre Biometrics - 11/13/15 1621      Pre Biometrics   Height 5' 6.5" (1.689 m)   Weight 181 lb (82.1 kg)   Waist Circumference 38.25 inches   Hip Circumference 39.5 inches   Waist to Hip Ratio 0.97 %   BMI (Calculated) 28.8   Triceps Skinfold 15 mm   % Body Fat 26.7 %   Grip Strength 43.5 kg   Flexibility 13 in   Single Leg Stand 30 seconds       Nutrition Therapy Plan and Nutrition Goals:     Nutrition Therapy & Goals - 11/19/15 1424      Nutrition Therapy   Diet Carb Modified, Therapeutic Lifestyle Changes     Personal Nutrition Goals   Personal Goal #1 1-2 lb wt loss/week to a goal wt loss of 6-24 lb at graduation from Cardiac Rehab   Personal Goal #2 Improved glycemic control as evidenced by an A1c decrease from 7.9 toward a goal of less than 7.0     Intervention Plan   Intervention Prescribe, educate and counsel regarding individualized specific dietary modifications aiming towards targeted core components such as weight, hypertension, lipid management, diabetes, heart failure and other comorbidities.   Expected Outcomes Short Term Goal: Understand basic principles of dietary content, such as calories, fat, sodium, cholesterol and nutrients.;Long Term Goal: Adherence to prescribed nutrition plan.      Nutrition Discharge: Nutrition Scores:     Nutrition Assessments - 11/23/15 1139      MEDFICTS Scores   Pre Score 42      Nutrition Goals Re-Evaluation:   Psychosocial: Target Goals: Acknowledge presence or absence of depression, maximize coping skills, provide positive support system. Participant is able to verbalize types and ability to use techniques and skills needed for reducing stress and depression.  Initial Review & Psychosocial Screening:     Initial Psych Review & Screening - 11/26/15 1532      Family Dynamics   Comments Pyschosocial Assessment reveals no needs  at this time, no further intervention warranted      Quality of Life Scores:     Quality of Life - 11/13/15 1608      Quality of Life Scores   Health/Function Pre 26.4 %   Socioeconomic Pre 24.86 %   Psych/Spiritual Pre 27.43 %   Family Pre 30 %   GLOBAL Pre 26.82 %      PHQ-9: Recent Review Flowsheet Data    Depression screen [facility] 2/9 11/19/2015 09/26/2015   Decreased Interest 0 0   Down, Depressed, Hopeless 0 0   PHQ - 2 Score 0 0  Psychosocial Evaluation and Intervention:     Psychosocial Evaluation - 12/20/15 1449      Psychosocial Evaluation & Interventions   Interventions Encouraged to exercise with the program and follow exercise prescription   Comments No further psychosocial needed identified, no further intervention warranted.   Continued Psychosocial Services Needed No      Psychosocial Re-Evaluation:     Psychosocial Re-Evaluation    Row Name 11/22/15 [phone] 12/20/15 1449 01/17/16 1430         Psychosocial Re-Evaluation   Interventions Encouraged to attend Cardiac Rehabilitation for the exercise Encouraged to attend Cardiac Rehabilitation for the exercise Encouraged to attend Cardiac Rehabilitation for the exercise     Comments  -  - No psychosocial needs identifed no further intervention needed at this time.     Continued Psychosocial Services Needed No No  -        Vocational Rehabilitation: Provide vocational rehab assistance to qualifying candidates.   Vocational Rehab Evaluation & Intervention:     Vocational Rehab - 11/13/15 1607      Initial Vocational Rehab Evaluation & Intervention   Assessment shows need for Vocational Rehabilitation Yes   Vocational Rehab Packet given to patient 11/13/15      Education: Education Goals: Education classes will be provided on a weekly basis, covering required topics. Participant will state understanding/return demonstration of topics presented.  Learning Barriers/Preferences:     Learning  Barriers/Preferences - 11/15/15 1238      Learning Barriers/Preferences   Learning Barriers --  Graig has a 9th grade education      Education Topics: Count Your Pulse:  -Group instruction provided by verbal instruction, demonstration, patient participation and written materials to support subject.  Instructors address importance of being able to find your pulse and how to count your pulse when at home without a heart monitor.  Patients get hands on experience counting their pulse with staff help and individually.   Heart Attack, Angina, and Risk Factor Modification:  -Group instruction provided by verbal instruction, video, and written materials to support subject.  Instructors address signs and symptoms of angina and heart attacks.    Also discuss risk factors for heart disease and how to make changes to improve heart health risk factors. Flowsheet Row CARDIAC REHAB PHASE II EXERCISE from 01/04/2016 in [facility] CARDIAC REHAB  Date  11/28/15  Instruction Review Code  2- meets goals/outcomes      Functional Fitness:  -Group instruction provided by verbal instruction, demonstration, patient participation, and written materials to support subject.  Instructors address safety measures for doing things around the house.  Discuss how to get up and down off the floor, how to pick things up properly, how to safely get out of a chair without assistance, and balance training. Flowsheet Row CARDIAC REHAB PHASE II EXERCISE from 01/04/2016 in [facility] CARDIAC REHAB  Date  12/21/15  Educator  Joice Lofts Fair   Instruction Review Code  2- meets goals/outcomes      Meditation and Mindfulness:  -Group instruction provided by verbal instruction, patient participation, and written materials to support subject.  Instructor addresses importance of mindfulness and meditation practice to help reduce stress and improve awareness.  Instructor also leads participants through a  meditation exercise.    Stretching for Flexibility and Mobility:  -Group instruction provided by verbal instruction, patient participation, and written materials to support subject.  Instructors lead participants through series of stretches that are designed to increase flexibility thus  improving mobility.  These stretches are additional exercise for major muscle groups that are typically performed during regular warm up and cool down. Flowsheet Row CARDIAC REHAB PHASE II EXERCISE [facility]  Date  01/04/16  Instruction Review Code  2- meets goals/outcomes      Hands Only CPR Anytime:  -Group instruction provided by verbal instruction, video, patient participation and written materials to support subject.  Instructors co-teach with [facility] video for hands only CPR.  Participants get hands on experience with mannequins.   Nutrition I class: Heart Healthy Eating:  -Group instruction provided by PowerPoint slides, verbal discussion, and written materials to support subject matter. The instructor gives an explanation and review of the Therapeutic Lifestyle Changes diet recommendations, which includes a discussion on lipid goals, dietary fat, sodium, fiber, plant stanol/sterol esters, sugar, and the components of a well-balanced, healthy diet.   Nutrition II class: Lifestyle Skills:  -Group instruction provided by PowerPoint slides, verbal discussion, and written materials to support subject matter. The instructor gives an explanation and review of label reading, grocery shopping for heart health, heart healthy recipe modifications, and ways to make healthier choices when eating out.   Diabetes Question & Answer:  -Group instruction provided by PowerPoint slides, verbal discussion, and written materials to support subject matter. The instructor gives an explanation and review of diabetes co-morbidities, pre- and post-prandial blood glucose goals,  pre-exercise blood glucose goals, signs, symptoms, and treatment of hypoglycemia and hyperglycemia, and foot care basics. Flowsheet Row CARDIAC REHAB PHASE II EXERCISE [facility]  Date  12/14/15  Educator  RD  Instruction Review Code  2- meets goals/outcomes      Diabetes Blitz:  -Group instruction provided by PowerPoint slides, verbal discussion, and written materials to support subject matter. The instructor gives an explanation and review of the physiology behind type 1 and type 2 diabetes, diabetes medications and rational behind using different medications, pre- and post-prandial blood glucose recommendations and Hemoglobin A1c goals, diabetes diet, and exercise including blood glucose guidelines for exercising safely.    Portion Distortion:  -Group instruction provided by PowerPoint slides, verbal discussion, written materials, and food models to support subject matter. The instructor gives an explanation of serving size versus portion size, changes in portions sizes over the last 20 years, and what consists of a serving from each food group.   Stress Management:  -Group instruction provided by verbal instruction, video, and written materials to support subject matter.  Instructors review role of stress in heart disease and how to cope with stress positively.     Exercising on Your Own:  -Group instruction provided by verbal instruction, power point, and written materials to support subject.  Instructors discuss benefits of exercise, components of exercise, frequency and intensity of exercise, and end points for exercise.  Also discuss use of nitroglycerin and activating EMS.  Review options of places to exercise outside of rehab.  Review guidelines for sex with heart disease. Flowsheet Row CARDIAC REHAB PHASE [facility]  Date  12/26/15  Educator  Joice LoftsAmber Fair EP  Instruction  Review Code  2- meets goals/outcomes      Cardiac Drugs I:  -Group instruction provided by verbal instruction and written materials to support subject.  Instructor reviews cardiac drug classes: antiplatelets, anticoagulants, beta blockers, and statins.  Instructor discusses reasons, side effects, and lifestyle considerations for each drug class.  Cardiac Drugs II:  -Group instruction provided by verbal instruction and written materials to support subject.  Instructor reviews cardiac drug classes: angiotensin converting enzyme inhibitors (ACE-I), angiotensin II receptor blockers (ARBs), nitrates, and calcium channel blockers.  Instructor discusses reasons, side effects, and lifestyle considerations for each drug class. Flowsheet Row CARDIAC REHAB PHASE II EXERCISE from 01/04/2016 in [facility] CARDIAC REHAB  Date  12/19/15  Instruction Review Code  2- meets goals/outcomes      Anatomy and Physiology of the Circulatory System:  -Group instruction provided by verbal instruction, video, and written materials to support subject.  Reviews functional anatomy of heart, how it relates to various diagnoses, and what role the heart plays in the overall system.   Knowledge Questionnaire Score:     Knowledge Questionnaire Score - 11/19/15 1424      Knowledge Questionnaire Score   Pre Score 19/24     DM 14/15      Core Components/Risk Factors/Patient Goals at Admission:     Personal Goals and Risk Factors at Admission - 11/13/15 1628      Core Components/Risk Factors/Patient Goals on Admission    Weight Management Weight Loss;Yes   Intervention Weight Management: Provide education and appropriate resources to help participant work on and attain dietary goals.;Weight Management: Develop a combined nutrition and exercise program designed to reach desired caloric intake, while maintaining appropriate intake of nutrient and fiber, sodium and fats, and appropriate energy  expenditure required for the weight goal.   Admit Weight 181 lb (82.1 kg)   Goal Weight: Short Term 175 lb (79.4 kg)   Goal Weight: Long Term 170 lb (77.1 kg)   Expected Outcomes Short Term: Continue to assess and modify interventions until short term weight is achieved;Long Term: Adherence to nutrition and physical activity/exercise program aimed toward attainment of established weight goal   Tobacco Cessation Yes   Number of packs per day 1   Intervention Offer self-teaching materials, assist with locating and accessing local/national Quit Smoking programs, and support quit date choice.   Expected Outcomes Long Term: Complete abstinence from all tobacco products for at least 12 months from quit date.;Short Term: Will quit all tobacco product use, adhering to prevention of relapse plan.   Diabetes Yes   Intervention Provide education about proper nutrition, including hydration, and aerobic/resistive exercise prescription along with prescribed medications to achieve blood glucose in normal ranges: Fasting glucose 65-99 mg/dL;Provide education about signs/symptoms and action to take for hypo/hyperglycemia.   Expected Outcomes Short Term: Participant verbalizes understanding of the signs/symptoms and immediate care of hyper/hypoglycemia, proper foot care and importance of medication, aerobic/resistive exercise and nutrition plan for blood glucose control.;Long Term: Attainment of HbA1C < 7%.   Personal Goal Other Yes   Personal Goal Eat healthy. Chest stronger.   Intervention Provide education about heart healthy eating.   Expected Outcomes Adherence to heart healthy diet guidelines provided by dietician.      Core Components/Risk Factors/Patient Goals Review:      Goals and Risk Factor Review    Row Name 11/21/15 1050 12/14/15 0824 01/16/16 0828         Core Components/Risk Factors/Patient Goals Review   Personal Goals Review (P)  Other Increase Strength and Stamina;Other  -     Review  (P)  Patient states he's making healthier eating choices, which is one of his personal goals. Pt feels chest is healing well and is getting stronger. Pt returned to work on 12/12/15. Started strength training  in [employer]. Pt is currently lifting 20-30lbs on pulley system Pt is getting back on track with weightloss last reported 84.6kg and today 84kg. Returned to [employer] with minimal diiscomfort. Introduced to Emergency planning/management officer and  is starting to feel stronger     Expected Outcomes (P)  Patient will continue to make heart healthy eating. Increase workloads as tolerated to help achieve weight loss goals. Pt will continue to get stronger in order to perform duties at work Pt will experience less pain/discomfort with pec region when doing activity, Pt is up to 40lbs on weights with pulley system.        Core Components/Risk Factors/Patient Goals at Discharge (Final Review):      Goals and Risk Factor Review - 01/16/16 0828      Core Components/Risk Factors/Patient Goals Review   Review Pt is getting back on track with weightloss last reported 84.6kg and today 84kg. Returned to [employer] with minimal diiscomfort. Introduced to Emergency planning/management officer and  is starting to feel stronger   Expected Outcomes Pt will experience less pain/discomfort with pec region when doing activity, Pt is up to 40lbs on weights with pulley system.      ITP Comments:     ITP Comments    Row Name 11/15/15 1224           ITP Comments Dr Armanda Magic is the medical director of cardiac rehab          Comments:  Pt is making expected progress toward personal goals after completing 22 sessions.  Pt seen in the surgeon office and will do a taper dose of steroids due to pt continued complaint of left sided pain.  Because of the nature of pt job and his continued complaint of soreness, pt has not ben cleared to return to work.  Pt returned to exercise forma beach trip.  No psychosocial needs identified.  Pt has a follow  up appt on next week. .Recommend continued exercise and life style modification education including  stress management and relaxation techniques to decrease cardiac risk profile. Alanson Aly, BSN

## 2016-01-18 ENCOUNTER — Encounter (HOSPITAL_COMMUNITY): Payer: BLUE CROSS/BLUE SHIELD

## 2016-01-18 ENCOUNTER — Encounter (HOSPITAL_COMMUNITY)
Admission: RE | Admit: 2016-01-18 | Discharge: 2016-01-18 | Disposition: A | Discharge status: Home / Self Care | Payer: BLUE CROSS/BLUE SHIELD | Source: Ambulatory Visit | Attending: Cardiovascular Disease | Admitting: Cardiovascular Disease

## 2016-01-18 DIAGNOSIS — Z951 Presence of aortocoronary bypass graft: Secondary | ICD-10-CM

## 2016-01-18 DIAGNOSIS — I214 Non-ST elevation (NSTEMI) myocardial infarction: Secondary | ICD-10-CM | POA: Diagnosis not present

## 2016-01-21 ENCOUNTER — Encounter (HOSPITAL_COMMUNITY): Payer: BLUE CROSS/BLUE SHIELD

## 2016-01-21 ENCOUNTER — Encounter (HOSPITAL_COMMUNITY)
Admission: RE | Admit: 2016-01-21 | Discharge: 2016-01-21 | Disposition: A | Discharge status: Home / Self Care | Payer: BLUE CROSS/BLUE SHIELD | Source: Ambulatory Visit | Attending: Cardiovascular Disease | Admitting: Cardiovascular Disease

## 2016-01-21 DIAGNOSIS — Z951 Presence of aortocoronary bypass graft: Secondary | ICD-10-CM

## 2016-01-21 DIAGNOSIS — I214 Non-ST elevation (NSTEMI) myocardial infarction: Secondary | ICD-10-CM

## 2016-01-23 ENCOUNTER — Encounter (HOSPITAL_COMMUNITY)
Admission: RE | Admit: 2016-01-23 | Discharge: 2016-01-23 | Disposition: A | Discharge status: Home / Self Care | Payer: BLUE CROSS/BLUE SHIELD | Source: Ambulatory Visit | Attending: Cardiovascular Disease | Admitting: Cardiovascular Disease

## 2016-01-23 ENCOUNTER — Encounter: Payer: Self-pay | Admitting: Cardiothoracic Surgery

## 2016-01-23 ENCOUNTER — Encounter (HOSPITAL_COMMUNITY): Payer: BLUE CROSS/BLUE SHIELD

## 2016-01-23 ENCOUNTER — Ambulatory Visit (INDEPENDENT_AMBULATORY_CARE_PROVIDER_SITE_OTHER): Payer: BLUE CROSS/BLUE SHIELD | Admitting: Cardiothoracic Surgery

## 2016-01-23 VITALS — BP 110/70 | HR 61 | Resp 16

## 2016-01-23 DIAGNOSIS — I214 Non-ST elevation (NSTEMI) myocardial infarction: Secondary | ICD-10-CM | POA: Diagnosis not present

## 2016-01-23 DIAGNOSIS — M792 Neuralgia and neuritis, unspecified: Secondary | ICD-10-CM | POA: Diagnosis not present

## 2016-01-23 DIAGNOSIS — Z951 Presence of aortocoronary bypass graft: Secondary | ICD-10-CM

## 2016-01-23 NOTE — Telephone Encounter (Signed)
-----   Message from Kerin PernaPeter Van Trigt, MD sent at 12/03/2015  5:26 PM EDT ----- Regarding: RE: strength training request 20 pound lifting limit until 3 months after date of surgery Kathlee NationsPeter Van trigt M.D. ----- Message ----- From: Joice LoftsAmber D Graden Hoshino Sent: 11/30/2015   7:00 AM To: Kerin PernaPeter Van Trigt, MD Subject: strength training request                      Patient has been in cardiac rehab approximately 2 weeks and doing is well. Pt has been averaging 3.4 METS and BP/HR has been in stabled with exercise. Pt is interested in doing strength training. If you feel this patient is appropriate to begin strength training please f/u with cardiac rehab staff.  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 What type of strength training progression would be appropriate to initiate with this patient.   Thank you  Kerr-McGeember Aryiana Klinkner

## 2016-01-23 NOTE — Progress Notes (Signed)
PCP is Redmond BasemanWONG,FRANCIS PATRICK, MD Referring Provider is Runell GessBerry, Jonathan J, MD  Chief Complaint  Patient presents with  . Routine Post Op    1 wk f/u with CT CHEST to assess for sterrnal malunion   Patient examined, CT scan of chest recently performed personally reviewed and counseled patient HPI: Patient returns for further discussion of his persistent left sided chest wall pain after sternotomy and CABG almost 3 months ago. A CT scan of chest was performed to rule out fibrous malunion of the sternum. The sternum appeared to be adequately healed without evidence of malunion. No evidence of infection.  The patient describes his pain mainly with elevation and abduction of his left upper arm above his head. He is able to participate in cardiac rehabilitation to a limited degree. There is no structural problems with healing. His lifting and range of motion is mainly limited by pain. He has tried local measures with minimal success. It appears to be slowly getting better. He was given a note that he return to work with a 40 pound weight lifting limits. He is now  3 months after surgery.  Past Medical History:  Diagnosis Date  . Coronary artery disease   . Diabetes mellitus type 2, noninsulin dependent (HCC) 08/2015  . Dyslipidemia associated with type 2 diabetes mellitus (HCC) 08/2015  . Tobacco abuse     Past Surgical History:  Procedure Laterality Date  . CARDIAC CATHETERIZATION N/A 08/28/2015   Procedure: Left Heart Cath and Coronary Angiography;  Surgeon: Runell GessJonathan J Berry, MD;  [facility];  Service: Cardiovascular;  Laterality: N/A;  . CORONARY ARTERY BYPASS GRAFT N/A 08/30/2015   Procedure: CORONARY ARTERY BYPASS GRAFTING (CABG) x2 using left internal mammary artery and right greater saphenous vein. ;  Surgeon: Kerin PernaPeter Van Trigt, MD;  LIMA-LAD, SVG-RI  . TEE WITHOUT CARDIOVERSION N/A 08/30/2015   Procedure: TRANSESOPHAGEAL ECHOCARDIOGRAM (TEE);  Surgeon: Kerin PernaPeter Van Trigt, MD;   [facility];  Service: Open Heart Surgery;  Laterality: N/A;    Family History  Problem Relation Age of Onset  . Coronary artery disease Father     MI  . Coronary artery disease Sister     s/p CABG x5    Social History Social History  Substance Use Topics  . Smoking status: Former Smoker    Packs/day: 1.50    Types: Cigarettes    Quit date: 08/28/2015  . Smokeless tobacco: Not on file  . Alcohol use 0.0 oz/week     Comment: only weekends 3-4 beers entire weekend    Current Outpatient Prescriptions  Medication Sig Dispense Refill  . aspirin 81 MG tablet Take 81 mg by mouth daily.    Marland Kitchen. atorvastatin (LIPITOR) 80 MG tablet Take 1 tablet (80 mg total) by mouth daily at 6 PM. 30 tablet 1  . Blood Glucose Monitoring Suppl (CONTOUR BLOOD GLUCOSE SYSTEM) DEVI Inject 1 Device as directed 2 (two) times daily. 1 Device 0  . glucose blood (FREESTYLE LITE) test strip Test once daily before breakfast 50 each 12  . Lancets (FREESTYLE) lancets Use as instructed 100 each 12  . metFORMIN (GLUCOPHAGE) 500 MG tablet Take 1 tablet (500 mg total) by mouth daily with breakfast. (Patient taking differently: Take 500 mg by mouth 2 (two) times daily with a meal. ) 30 tablet 1  . metoprolol tartrate (LOPRESSOR) 25 MG tablet Take 12.5 mg by mouth 2 (two) times daily.    . traMADol (ULTRAM) 50 MG tablet Take 2 tablets (  100 mg total) by mouth every 6 (six) hours as needed. 40 tablet 0   No current facility-administered medications for this visit.     No Known Allergies  Review of Systems  No change  BP 110/70   Pulse 61   Resp 16   Ht (P) 5\' 5"  (1.651 m)   Wt (P) 185 lb (83.9 kg)   SpO2 98% Comment: ON RA  BMI (P) 30.79 kg/m  Physical Exam      Exam    General- alert and comfortable   Lungs- clear without rales, wheezes   Cor- regular rate and rhythm, no murmur , gallop   Abdomen- soft, non-tender   Extremities - warm, non-tender, minimal edema   Neuro- oriented, appropriate, no focal  weakness   Diagnostic Tests: CT scan personally reviewed and discussed with patient No abnormalities noted and sternal healing  Impression: Chest wall pain after sternotomy No evidence of fibrous malunion of the sternum  Plan: Return to work with weight limit 40 pounds Return for follow-up in 8 weeks for review of progress  Mikey Bussing, MD [facility] [phone]

## 2016-01-25 ENCOUNTER — Encounter (HOSPITAL_COMMUNITY): Payer: BLUE CROSS/BLUE SHIELD

## 2016-01-28 ENCOUNTER — Encounter (HOSPITAL_COMMUNITY): Payer: BLUE CROSS/BLUE SHIELD

## 2016-01-28 ENCOUNTER — Encounter (HOSPITAL_COMMUNITY)
Admission: RE | Admit: 2016-01-28 | Discharge: 2016-01-28 | Disposition: A | Discharge status: Home / Self Care | Payer: BLUE CROSS/BLUE SHIELD | Source: Ambulatory Visit | Attending: Cardiovascular Disease | Admitting: Cardiovascular Disease

## 2016-01-28 ENCOUNTER — Telehealth (HOSPITAL_COMMUNITY): Payer: Self-pay | Admitting: *Deleted

## 2016-01-28 DIAGNOSIS — I214 Non-ST elevation (NSTEMI) myocardial infarction: Secondary | ICD-10-CM | POA: Diagnosis not present

## 2016-01-28 DIAGNOSIS — Z951 Presence of aortocoronary bypass graft: Secondary | ICD-10-CM | POA: Diagnosis not present

## 2016-01-28 NOTE — Telephone Encounter (Signed)
-----   Message from Kerin PernaPeter Van Trigt, MD sent at 01/26/2016  1:09 PM EDT ----- Regarding: RE: Guidance for weight training in Cardiac rehab Keep lifting limited at 40 pounds at this time ----- Message ----- From: Chelsea Ausarlette B Carlton, RN Sent: 01/18/2016   7:44 AM To: Kerin PernaPeter Van Trigt, MD Subject: Guidance for weight training in Cardiac rehab  Dr. Morton PetersVan Tright,  Harvie Heckandy returned to exercise today.  I reviewed your office visit notes. I had the patient hold off on lifting 40 pound weights pending results of the chest CT he had yesterday.  As you know we were having him lift weights in preparation for returning back to work where he is expected to lift 75 pounds. Pt returned to work but was taken back out after 3 days.  Please provide us with guidance regarding weight lifting once he is cleared to resume lifting here in rehab. Pt has 13 remaining sessions to complete.  Thanks PepsiCoCarlette Carlton RN

## 2016-01-30 ENCOUNTER — Encounter (HOSPITAL_COMMUNITY): Payer: BLUE CROSS/BLUE SHIELD

## 2016-01-30 ENCOUNTER — Encounter (HOSPITAL_COMMUNITY)
Admission: RE | Admit: 2016-01-30 | Discharge: 2016-01-30 | Disposition: A | Discharge status: Home / Self Care | Payer: BLUE CROSS/BLUE SHIELD | Source: Ambulatory Visit | Attending: Cardiovascular Disease | Admitting: Cardiovascular Disease

## 2016-01-30 DIAGNOSIS — I214 Non-ST elevation (NSTEMI) myocardial infarction: Secondary | ICD-10-CM | POA: Diagnosis not present

## 2016-01-30 DIAGNOSIS — Z951 Presence of aortocoronary bypass graft: Secondary | ICD-10-CM | POA: Diagnosis not present

## 2016-02-01 ENCOUNTER — Encounter (HOSPITAL_COMMUNITY)
Admission: RE | Admit: 2016-02-01 | Discharge: 2016-02-01 | Disposition: A | Discharge status: Home / Self Care | Payer: BLUE CROSS/BLUE SHIELD | Source: Ambulatory Visit | Attending: Cardiovascular Disease | Admitting: Cardiovascular Disease

## 2016-02-01 ENCOUNTER — Encounter (HOSPITAL_COMMUNITY): Payer: BLUE CROSS/BLUE SHIELD

## 2016-02-01 DIAGNOSIS — Z951 Presence of aortocoronary bypass graft: Secondary | ICD-10-CM | POA: Diagnosis not present

## 2016-02-01 DIAGNOSIS — I214 Non-ST elevation (NSTEMI) myocardial infarction: Secondary | ICD-10-CM

## 2016-02-04 ENCOUNTER — Encounter (HOSPITAL_COMMUNITY)
Admission: RE | Admit: 2016-02-04 | Discharge: 2016-02-04 | Disposition: A | Discharge status: Home / Self Care | Payer: BLUE CROSS/BLUE SHIELD | Source: Ambulatory Visit | Attending: Cardiovascular Disease | Admitting: Cardiovascular Disease

## 2016-02-04 ENCOUNTER — Encounter (HOSPITAL_COMMUNITY): Payer: BLUE CROSS/BLUE SHIELD

## 2016-02-04 DIAGNOSIS — I214 Non-ST elevation (NSTEMI) myocardial infarction: Secondary | ICD-10-CM

## 2016-02-04 DIAGNOSIS — Z951 Presence of aortocoronary bypass graft: Secondary | ICD-10-CM

## 2016-02-06 ENCOUNTER — Encounter (HOSPITAL_COMMUNITY)
Admission: RE | Admit: 2016-02-06 | Discharge: 2016-02-06 | Disposition: A | Discharge status: Home / Self Care | Payer: BLUE CROSS/BLUE SHIELD | Source: Ambulatory Visit | Attending: Cardiovascular Disease | Admitting: Cardiovascular Disease

## 2016-02-06 ENCOUNTER — Encounter (HOSPITAL_COMMUNITY): Payer: BLUE CROSS/BLUE SHIELD

## 2016-02-06 DIAGNOSIS — Z951 Presence of aortocoronary bypass graft: Secondary | ICD-10-CM | POA: Diagnosis not present

## 2016-02-06 DIAGNOSIS — I214 Non-ST elevation (NSTEMI) myocardial infarction: Secondary | ICD-10-CM | POA: Insufficient documentation

## 2016-02-08 ENCOUNTER — Encounter (HOSPITAL_COMMUNITY): Payer: BLUE CROSS/BLUE SHIELD

## 2016-02-08 ENCOUNTER — Encounter (HOSPITAL_COMMUNITY)
Admission: RE | Admit: 2016-02-08 | Discharge: 2016-02-08 | Disposition: A | Discharge status: Home / Self Care | Payer: BLUE CROSS/BLUE SHIELD | Source: Ambulatory Visit | Attending: Cardiovascular Disease | Admitting: Cardiovascular Disease

## 2016-02-08 DIAGNOSIS — Z951 Presence of aortocoronary bypass graft: Secondary | ICD-10-CM | POA: Diagnosis not present

## 2016-02-08 DIAGNOSIS — I214 Non-ST elevation (NSTEMI) myocardial infarction: Secondary | ICD-10-CM

## 2016-02-11 ENCOUNTER — Encounter (HOSPITAL_COMMUNITY): Payer: BLUE CROSS/BLUE SHIELD

## 2016-02-11 ENCOUNTER — Other Ambulatory Visit: Payer: Self-pay | Admitting: Cardiothoracic Surgery

## 2016-02-11 ENCOUNTER — Encounter (HOSPITAL_COMMUNITY)
Admission: RE | Admit: 2016-02-11 | Discharge: 2016-02-11 | Disposition: A | Discharge status: Home / Self Care | Payer: BLUE CROSS/BLUE SHIELD | Source: Ambulatory Visit | Attending: Cardiovascular Disease | Admitting: Cardiovascular Disease

## 2016-02-11 DIAGNOSIS — I214 Non-ST elevation (NSTEMI) myocardial infarction: Secondary | ICD-10-CM

## 2016-02-11 DIAGNOSIS — M792 Neuralgia and neuritis, unspecified: Secondary | ICD-10-CM

## 2016-02-11 DIAGNOSIS — Z951 Presence of aortocoronary bypass graft: Secondary | ICD-10-CM

## 2016-02-13 ENCOUNTER — Encounter (HOSPITAL_COMMUNITY)
Admission: RE | Admit: 2016-02-13 | Discharge: 2016-02-13 | Disposition: A | Discharge status: Home / Self Care | Payer: BLUE CROSS/BLUE SHIELD | Source: Ambulatory Visit | Attending: Cardiovascular Disease | Admitting: Cardiovascular Disease

## 2016-02-13 ENCOUNTER — Encounter (HOSPITAL_COMMUNITY): Payer: BLUE CROSS/BLUE SHIELD

## 2016-02-13 DIAGNOSIS — I214 Non-ST elevation (NSTEMI) myocardial infarction: Secondary | ICD-10-CM | POA: Diagnosis not present

## 2016-02-13 DIAGNOSIS — Z951 Presence of aortocoronary bypass graft: Secondary | ICD-10-CM

## 2016-02-14 NOTE — Progress Notes (Signed)
Cardiac Individual Treatment Plan  Patient Details  Name: Justin Olson MRN: [id number] Date of Birth: Oct 28, 1965 Referring Provider:   Flowsheet Row CARDIAC REHAB PHASE II ORIENTATION from 11/13/2015 in [facility] CARDIAC REHAB  Referring Provider  Nanetta Batty MD      Initial Encounter Date:  Flowsheet Row CARDIAC REHAB PHASE II ORIENTATION from 11/13/2015 in [facility] CARDIAC REHAB  Date  11/13/15  Referring Provider  Nanetta Batty MD      Visit Diagnosis: NSTEMI (non-ST elevated myocardial infarction) (HCC)  S/P CABG x 2  Patient's Home Medications on Admission:  Current Outpatient Prescriptions:  .  aspirin 81 MG tablet, Take 81 mg by mouth daily., Disp: , Rfl:  .  atorvastatin (LIPITOR) 80 MG tablet, Take 1 tablet (80 mg total) by mouth daily at 6 PM., Disp: 30 tablet, Rfl: 1 .  Blood Glucose Monitoring Suppl (CONTOUR BLOOD GLUCOSE SYSTEM) DEVI, Inject 1 Device as directed 2 (two) times daily., Disp: 1 Device, Rfl: 0 .  glucose blood (FREESTYLE LITE) test strip, Test once daily before breakfast, Disp: 50 each, Rfl: 12 .  Lancets (FREESTYLE) lancets, Use as instructed, Disp: 100 each, Rfl: 12 .  metFORMIN (GLUCOPHAGE) 500 MG tablet, Take 1 tablet (500 mg total) by mouth daily with breakfast. (Patient taking differently: Take 500 mg by mouth 2 (two) times daily with a meal. ), Disp: 30 tablet, Rfl: 1 .  metoprolol tartrate (LOPRESSOR) 25 MG tablet, Take 12.5 mg by mouth 2 (two) times daily., Disp: , Rfl:  .  traMADol (ULTRAM) 50 MG tablet, Take 2 tablets (100 mg total) by mouth every 6 (six) hours as needed., Disp: 40 tablet, Rfl: 0  Past Medical History: Past Medical History:  Diagnosis Date  . Coronary artery disease   . Diabetes mellitus type 2, noninsulin dependent (HCC) 08/2015  . Dyslipidemia associated with type 2 diabetes mellitus (HCC) 08/2015  . Tobacco abuse     Tobacco Use: History  Smoking Status  . Former Smoker  .  Packs/day: 1.50  . Types: Cigarettes  . Quit date: 08/28/2015  Smokeless Tobacco  . Not on file    Labs: Recent Review Flowsheet Data    Labs for ITP Cardiac and Pulmonary Rehab Latest Ref Rng & Units 08/30/2015 08/30/2015 08/30/2015 08/30/2015 08/31/2015   Cholestrol 0 - 200 mg/dL - - - - -   LDLCALC 0 - 99 mg/dL - - - - -   HDL >40 mg/dL - - - - -   Trlycerides <150 mg/dL - - - - -   Hemoglobin A1c 4.8 - 5.6 % - - - - -   PHART 7.350 - 7.450 7.291(L) 7.334(L) 7.338(L) - -   PCO2ART 35.0 - 45.0 mmHg 47.0(H) 40.5 37.7 - -   HCO3 20.0 - 24.0 mEq/L 22.7 21.5 20.2 - -   TCO2 0 - 100 mmol/L 24 23 21 21 21    ACIDBASEDEF 0.0 - 2.0 mmol/L 4.0(H) 4.0(H) 5.0(H) - -   O2SAT % 93.0 97.0 89.0 - -      Capillary Blood Glucose: Lab Results  Component Value Date   GLUCAP 132 (H) 11/23/2015   GLUCAP 194 (H) 11/23/2015   GLUCAP 118 (H) 11/21/2015   GLUCAP 212 (H) 11/21/2015   GLUCAP 120 (H) 11/19/2015     Exercise Target Goals:    Exercise Program Goal: Individual exercise prescription set with THRR, safety & activity barriers. Participant demonstrates ability to understand and report RPE using BORG scale,  to self-measure pulse accurately, and to acknowledge the importance of the exercise prescription.  Exercise Prescription Goal: Starting with aerobic activity 30 plus minutes a day, 3 days per week for initial exercise prescription. Provide home exercise prescription and guidelines that participant acknowledges understanding prior to discharge.  Activity Barriers & Risk Stratification:     Activity Barriers & Cardiac Risk Stratification - 11/13/15 1422      Activity Barriers & Cardiac Risk Stratification   Activity Barriers None   Cardiac Risk Stratification High      6 Minute Walk:     6 Minute Walk    Row Name 11/13/15 1623         6 Minute Walk   Phase Initial     Distance 1383 feet     Walk Time 6 minutes     # of Rest Breaks 0     MPH 2.62     METS 3.73     RPE 7      VO2 Peak 13.06     Symptoms No     Resting HR 64 bpm     Resting BP 104/60     Max Ex. HR 78 bpm     Max Ex. BP 112/72     2 Minute Post BP 108/60        Initial Exercise Prescription:     Initial Exercise Prescription - 11/13/15 1600      Date of Initial Exercise RX and Referring Provider   Date 11/13/15   Referring Provider [name], Jonathan MD     Treadmill   MPH 2.5   Grade 1   Minutes 10   METs 3.26     Bike   Level 1.2   Minutes 10   METs 3.75     NuStep   Level 3   Minutes 10   METs 2.8     Prescription Details   Frequency (times per week) 3   Duration Progress to 30 minutes of continuous aerobic without signs/symptoms of physical distress     Intensity   THRR 40-80% of Max Heartrate 68-136   Ratings of Perceived Exertion 11-13   Perceived Dyspnea 0-4     Progression   Progression Continue to progress workloads to maintain intensity without signs/symptoms of physical distress.     Resistance Training   Training Prescription Yes   Weight 2 lbs   Reps 10-12      Perform Capillary Blood Glucose checks as needed.  Exercise Prescription Changes:     Exercise Prescription Changes    Row Name 11/21/15 1700 11/28/15 0800 12/14/15 0800 01/09/16 1000 01/16/16 0800     Exercise Review   Progression Yes Yes Yes Yes Yes     Response to Exercise   Blood Pressure (Admit) 102/76 108/72 108/70 112/60 102/68   Blood Pressure (Exercise) 144/70 114/63 126/82 120/64 144/70   Blood Pressure (Exit) 114/70 100/64 106/60 102/64 100/70   Heart Rate (Admit) 63 bpm 93 bpm 67 bpm 61 bpm 79 bpm   Heart Rate (Exercise) 107 bpm 115 bpm 104 bpm 107 bpm 105 bpm   Heart Rate (Exit) 65 bpm 75 bpm 67 bpm 57 bpm 79 bpm   Rating of Perceived Exertion (Exercise) 11 11 13 11 14    Comments Reviewed home exercise guidelines on 11/21/15. Reviewed home exercise guidelines on 11/21/15. Reviewed home exercise guidelines on 11/21/15. Reviewed home exercise guidelines on 11/21/15. pt was  oriented to weighs for resistance training on 12/26/15  Duration Progress to 30 minutes of continuous aerobic without signs/symptoms of physical distress Progress to 30 minutes of continuous aerobic without signs/symptoms of physical distress Progress to 30 minutes of continuous aerobic without signs/symptoms of physical distress Progress to 30 minutes of continuous aerobic without signs/symptoms of physical distress Progress to 30 minutes of continuous aerobic without signs/symptoms of physical distress   Intensity THRR unchanged THRR unchanged THRR unchanged THRR unchanged THRR unchanged     Progression   Progression Continue to progress workloads to maintain intensity without signs/symptoms of physical distress. Continue to progress workloads to maintain intensity without signs/symptoms of physical distress. Continue to progress workloads to maintain intensity without signs/symptoms of physical distress. Continue to progress workloads to maintain intensity without signs/symptoms of physical distress. Continue to progress workloads to maintain intensity without signs/symptoms of physical distress.   Average METs 3.5 3.5 3.9 4.5 4.9     Resistance Training   Training Prescription Yes Yes Yes Yes Yes   Weight 2 lbs 2 lbs 2 lbs 5lbs 5lbs   Reps 10-12 10-12 10-12 10-12 10-12     Interval Training   Interval Training No No No No No     Treadmill   MPH 3 3 3  3.4 3.4   Grade 2 2 2 4 4    Minutes 10 10 10 10 10    METs 4.12 4.12 4.12 5.48 5.48     Bike   Level 1.5 1.5 1.5 1.5 1.5   Minutes 10 10 10 10 10    METs 4.44 4.44 4.44 4.44 4.44     NuStep   Level 4 4 4 4 4    Minutes 10 10 10 10 10    METs 2.6 2.9 3.4 3.5 3.5     Home Exercise Plan   Plans to continue exercise at Home Home Home Home Home   Frequency Add 4 additional days to program exercise sessions. Add 4 additional days to program exercise sessions. Add 4 additional days to program exercise sessions. Add 4 additional days to program  exercise sessions. Add 4 additional days to program exercise sessions.   Row Name 02/01/16 1600             Exercise Review   Progression Yes         Response to Exercise   Blood Pressure (Admit) 120/70       Blood Pressure (Exercise) 142/70       Blood Pressure (Exit) 118/60       Heart Rate (Admit) 71 bpm       Heart Rate (Exercise) 111 bpm       Heart Rate (Exit) 67 bpm       Rating of Perceived Exertion (Exercise) 13       Comments pt was oriented to weighs for resistance training on 12/26/15  Pt has been doing weights for 3rd station (approx. 10min)       Duration Progress to 30 minutes of continuous aerobic without signs/symptoms of physical distress       Intensity THRR unchanged         Progression   Progression Continue to progress workloads to maintain intensity without signs/symptoms of physical distress.       Average METs 5.4         Resistance Training   Training Prescription Yes       Weight 5lbs       Reps 10-12         Interval Training   Interval Training No  Treadmill   MPH 3.6       Grade 4       Minutes 10       METs 5.74         Bike   Level 1.8       Minutes 10       METs 5.01         Home Exercise Plan   Plans to continue exercise at Home       Frequency Add 4 additional days to program exercise sessions.          Exercise Comments:     Exercise Comments    Row Name 11/21/15 1049 11/28/15 0820 12/14/15 0823 02/01/16 1641     Exercise Comments Reviewed home exercise guidelines. Increasing workloads consistently as tolerated. Reviewed METs with pt and pt is interested in strength training to assist with returning to work on December 12, 2015. Will send request to Dr. Morton Peters Reviewed METs and goals. Pt is tolerating exercise well; will continue to monitor exercise progression Reviewed METs and goals. Pt is tolerating exercise well; will continue to monitor exercise progression       Discharge Exercise Prescription (Final  Exercise Prescription Changes):     Exercise Prescription Changes - 02/01/16 1600      Exercise Review   Progression Yes     Response to Exercise   Blood Pressure (Admit) 120/70   Blood Pressure (Exercise) 142/70   Blood Pressure (Exit) 118/60   Heart Rate (Admit) 71 bpm   Heart Rate (Exercise) 111 bpm   Heart Rate (Exit) 67 bpm   Rating of Perceived Exertion (Exercise) 13   Comments pt was oriented to weighs for resistance training on 12/26/15  Pt has been doing weights for 3rd station (approx. )   Duration Progress to 30 minutes of continuous aerobic without signs/symptoms of physical distress   Intensity THRR unchanged     Progression   Progression Continue to progress workloads to maintain intensity without signs/symptoms of physical distress.   Average METs 5.4     Resistance Training   Training Prescription Yes   Weight 5lbs   Reps 10-12     Interval Training   Interval Training No     Treadmill   MPH 3.6   Grade 4   Minutes 10   METs 5.74     Bike   Level 1.8   Minutes 10   METs 5.01     Home Exercise Plan   Plans to continue exercise at Home   Frequency Add 4 additional days to program exercise sessions.      Nutrition:  Target Goals: Understanding of nutrition guidelines, daily intake of sodium 1500mg , cholesterol 200mg , calories 30% from fat and 7% or less from saturated fats, daily to have 5 or more servings of fruits and vegetables.  Biometrics:     Pre Biometrics - 11/13/15 1621      Pre Biometrics   Height 5' 6.5" (1.689 m)   Weight 181 lb (82.1 kg)   Waist Circumference 38.25 inches   Hip Circumference 39.5 inches   Waist to Hip Ratio 0.97 %   BMI (Calculated) 28.8   Triceps Skinfold 15 mm   % Body Fat 26.7 %   Grip Strength 43.5 kg   Flexibility 13 in   Single Leg Stand 30 seconds       Nutrition Therapy Plan and Nutrition Goals:     Nutrition Therapy & Goals - 11/19/15 1424  Nutrition Therapy   Diet Carb  Modified, Therapeutic Lifestyle Changes     Personal Nutrition Goals   Personal Goal #1 1-2 lb wt loss/week to a goal wt loss of 6-24 lb at graduation from Cardiac Rehab   Personal Goal #2 Improved glycemic control as evidenced by an A1c decrease from 7.9 toward a goal of less than 7.0     Intervention Plan   Intervention Prescribe, educate and counsel regarding individualized specific dietary modifications aiming towards targeted core components such as weight, hypertension, lipid management, diabetes, heart failure and other comorbidities.   Expected Outcomes Short Term Goal: Understand basic principles of dietary content, such as calories, fat, sodium, cholesterol and nutrients.;Long Term Goal: Adherence to prescribed nutrition plan.      Nutrition Discharge: Nutrition Scores:     Nutrition Assessments - 11/23/15 1139      MEDFICTS Scores   Pre Score 42      Nutrition Goals Re-Evaluation:   Psychosocial: Target Goals: Acknowledge presence or absence of depression, maximize coping skills, provide positive support system. Participant is able to verbalize types and ability to use techniques and skills needed for reducing stress and depression.  Initial Review & Psychosocial Screening:     Initial Psych Review & Screening - 11/26/15 1532      Family Dynamics   Comments Pyschosocial Assessment reveals no needs at this time, no further intervention warranted      Quality of Life Scores:     Quality of Life - 11/13/15 1608      Quality of Life Scores   Health/Function Pre 26.4 %   Socioeconomic Pre 24.86 %   Psych/Spiritual Pre 27.43 %   Family Pre 30 %   GLOBAL Pre 26.82 %      PHQ-9: Recent Review Flowsheet Data    Depression screen [facility] 2/9 11/19/2015 09/26/2015   Decreased Interest 0 0   Down, Depressed, Hopeless 0 0   PHQ - 2 Score 0 0      Psychosocial Evaluation and Intervention:     Psychosocial Evaluation - 02/14/16 1144      Psychosocial Evaluation  & Interventions   Interventions Encouraged to exercise with the program and follow exercise prescription   Comments No further psychosocial needed identified, no further intervention warranted.     Discharge Psychosocial Assessment & Intervention   Discharge Continue support measures as needed      Psychosocial Re-Evaluation:     Psychosocial Re-Evaluation    Row Name 11/22/15 [id number] 12/20/15 1449 01/17/16 1430 02/14/16 1144       Psychosocial Re-Evaluation   Interventions Encouraged to attend Cardiac Rehabilitation for the exercise Encouraged to attend Cardiac Rehabilitation for the exercise Encouraged to attend Cardiac Rehabilitation for the exercise Encouraged to attend Cardiac Rehabilitation for the exercise    Comments  -  - No psychosocial needs identifed no further intervention needed at this time. No psychosocial needs identifed no further intervention needed at this time.    Continued Psychosocial Services Needed No No  -  -       Vocational Rehabilitation: Provide vocational rehab assistance to qualifying candidates.   Vocational Rehab Evaluation & Intervention:     Vocational Rehab - 11/13/15 1607      Initial Vocational Rehab Evaluation & Intervention   Assessment shows need for Vocational Rehabilitation Yes   Vocational Rehab Packet given to patient 11/13/15      Education: Education Goals: Education classes will be provided on a weekly basis, covering required  topics. Participant will state understanding/return demonstration of topics presented.  Learning Barriers/Preferences:     Learning Barriers/Preferences - 11/15/15 1238      Learning Barriers/Preferences   Learning Barriers --  Justin Olson has a 9th grade education      Education Topics: Count Your Pulse:  -Group instruction provided by verbal instruction, demonstration, patient participation and written materials to support subject.  Instructors address importance of being able to find your pulse and  how to count your pulse when at home without a heart monitor.  Patients get hands on experience counting their pulse with staff help and individually.   Heart Attack, Angina, and Risk Factor Modification:  -Group instruction provided by verbal instruction, video, and written materials to support subject.  Instructors address signs and symptoms of angina and heart attacks.    Also discuss risk factors for heart disease and how to make changes to improve heart health risk factors. Flowsheet Row CARDIAC REHAB PHASE II EXERCISE from 01/04/2016 in [facility] CARDIAC REHAB  Date  11/28/15  Instruction Review Code  2- meets goals/outcomes      Functional Fitness:  -Group instruction provided by verbal instruction, demonstration, patient participation, and written materials to support subject.  Instructors address safety measures for doing things around the house.  Discuss how to get up and down off the floor, how to pick things up properly, how to safely get out of a chair without assistance, and balance training. Flowsheet Row CARDIAC REHAB PHASE II EXERCISE from 01/04/2016 in [facility] CARDIAC REHAB  Date  12/21/15  Educator  Joice Lofts Fair   Instruction Review Code  2- meets goals/outcomes      Meditation and Mindfulness:  -Group instruction provided by verbal instruction, patient participation, and written materials to support subject.  Instructor addresses importance of mindfulness and meditation practice to help reduce stress and improve awareness.  Instructor also leads participants through a meditation exercise.    Stretching for Flexibility and Mobility:  -Group instruction provided by verbal instruction, patient participation, and written materials to support subject.  Instructors lead participants through series of stretches that are designed to increase flexibility thus improving mobility.  These stretches are additional exercise for major muscle groups  that are typically performed during regular warm up and cool down. Flowsheet Row CARDIAC REHAB PHASE II EXERCISE from 01/04/2016 in [facility] CARDIAC REHAB  Date  01/04/16  Instruction Review Code  2- meets goals/outcomes      Hands Only CPR Anytime:  -Group instruction provided by verbal instruction, video, patient participation and written materials to support subject.  Instructors co-teach with [facility] video for hands only CPR.  Participants get hands on experience with mannequins.   Nutrition I class: Heart Healthy Eating:  -Group instruction provided by PowerPoint slides, verbal discussion, and written materials to support subject matter. The instructor gives an explanation and review of the Therapeutic Lifestyle Changes diet recommendations, which includes a discussion on lipid goals, dietary fat, sodium, fiber, plant stanol/sterol esters, sugar, and the components of a well-balanced, healthy diet.   Nutrition II class: Lifestyle Skills:  -Group instruction provided by PowerPoint slides, verbal discussion, and written materials to support subject matter. The instructor gives an explanation and review of label reading, grocery shopping for heart health, heart healthy recipe modifications, and ways to make healthier choices when eating out.   Diabetes Question & Answer:  -Group instruction provided by PowerPoint slides, verbal discussion, and written materials to support subject  matter. The instructor gives an explanation and review of diabetes co-morbidities, pre- and post-prandial blood glucose goals, pre-exercise blood glucose goals, signs, symptoms, and treatment of hypoglycemia and hyperglycemia, and foot care basics. Flowsheet Row CARDIAC REHAB PHASE II EXERCISE from 01/04/2016 in [facility] CARDIAC REHAB  Date  12/14/15  Educator  RD  Instruction Review Code  2- meets goals/outcomes      Diabetes Blitz:  -Group instruction provided by PowerPoint  slides, verbal discussion, and written materials to support subject matter. The instructor gives an explanation and review of the physiology behind type 1 and type 2 diabetes, diabetes medications and rational behind using different medications, pre- and post-prandial blood glucose recommendations and Hemoglobin A1c goals, diabetes diet, and exercise including blood glucose guidelines for exercising safely.    Portion Distortion:  -Group instruction provided by PowerPoint slides, verbal discussion, written materials, and food models to support subject matter. The instructor gives an explanation of serving size versus portion size, changes in portions sizes over the last 20 years, and what consists of a serving from each food group.   Stress Management:  -Group instruction provided by verbal instruction, video, and written materials to support subject matter.  Instructors review role of stress in heart disease and how to cope with stress positively.     Exercising on Your Own:  -Group instruction provided by verbal instruction, power point, and written materials to support subject.  Instructors discuss benefits of exercise, components of exercise, frequency and intensity of exercise, and end points for exercise.  Also discuss use of nitroglycerin and activating EMS.  Review options of places to exercise outside of rehab.  Review guidelines for sex with heart disease. Flowsheet Row CARDIAC REHAB PHASE II EXERCISE from 01/04/2016 in [facility] CARDIAC REHAB  Date  12/26/15  Educator  Joice Lofts Fair EP  Instruction Review Code  2- meets goals/outcomes      Cardiac Drugs I:  -Group instruction provided by verbal instruction and written materials to support subject.  Instructor reviews cardiac drug classes: antiplatelets, anticoagulants, beta blockers, and statins.  Instructor discusses reasons, side effects, and lifestyle considerations for each drug class.   Cardiac Drugs II:   -Group instruction provided by verbal instruction and written materials to support subject.  Instructor reviews cardiac drug classes: angiotensin converting enzyme inhibitors (ACE-I), angiotensin II receptor blockers (ARBs), nitrates, and calcium channel blockers.  Instructor discusses reasons, side effects, and lifestyle considerations for each drug class. Flowsheet Row CARDIAC REHAB PHASE II EXERCISE from 01/04/2016 in [facility] CARDIAC REHAB  Date  12/19/15  Instruction Review Code  2- meets goals/outcomes      Anatomy and Physiology of the Circulatory System:  -Group instruction provided by verbal instruction, video, and written materials to support subject.  Reviews functional anatomy of heart, how it relates to various diagnoses, and what role the heart plays in the overall system.   Knowledge Questionnaire Score:     Knowledge Questionnaire Score - 11/19/15 1424      Knowledge Questionnaire Score   Pre Score 19/24     DM 14/15      Core Components/Risk Factors/Patient Goals at Admission:     Personal Goals and Risk Factors at Admission - 11/13/15 1628      Core Components/Risk Factors/Patient Goals on Admission    Weight Management Weight Loss;Yes   Intervention Weight Management: Provide education and appropriate resources to help participant work on and attain dietary goals.;Weight Management: Develop a  combined nutrition and exercise program designed to reach desired caloric intake, while maintaining appropriate intake of nutrient and fiber, sodium and fats, and appropriate energy expenditure required for the weight goal.   Admit Weight 181 lb (82.1 kg)   Goal Weight: Short Term 175 lb (79.4 kg)   Goal Weight: Long Term 170 lb (77.1 kg)   Expected Outcomes Short Term: Continue to assess and modify interventions until short term weight is achieved;Long Term: Adherence to nutrition and physical activity/exercise program aimed toward attainment of  established weight goal   Tobacco Cessation Yes   Number of packs per day 1   Intervention Offer self-teaching materials, assist with locating and accessing local/national Quit Smoking programs, and support quit date choice.   Expected Outcomes Long Term: Complete abstinence from all tobacco products for at least 12 months from quit date.;Short Term: Will quit all tobacco product use, adhering to prevention of relapse plan.   Diabetes Yes   Intervention Provide education about proper nutrition, including hydration, and aerobic/resistive exercise prescription along with prescribed medications to achieve blood glucose in normal ranges: Fasting glucose 65-99 mg/dL;Provide education about signs/symptoms and action to take for hypo/hyperglycemia.   Expected Outcomes Short Term: Participant verbalizes understanding of the signs/symptoms and immediate care of hyper/hypoglycemia, proper foot care and importance of medication, aerobic/resistive exercise and nutrition plan for blood glucose control.;Long Term: Attainment of HbA1C < 7%.   Personal Goal Other Yes   Personal Goal Eat healthy. Chest stronger.   Intervention Provide education about heart healthy eating.   Expected Outcomes Adherence to heart healthy diet guidelines provided by dietician.      Core Components/Risk Factors/Patient Goals Review:      Goals and Risk Factor Review    Row Name 11/21/15 1050 12/14/15 0824 01/16/16 0828 02/08/16 1210 02/08/16 1213     Core Components/Risk Factors/Patient Goals Review   Personal Goals Review (P)  Other Increase Strength and Stamina;Other  - Increase Strength and Stamina;Other  -   Review (P)  Patient states he's making healthier eating choices, which is one of his personal goals. Pt feels chest is healing well and is getting stronger. Pt returned to work on 12/12/15. Started strength training in [employer]. Pt is currently lifting 20-30lbs on pulley system Pt is getting back on track with weightloss last  reported 84.6kg and today 84kg. Returned to [employer] with minimal diiscomfort. Introduced to Emergency planning/management officer and  is starting to feel stronger Pt is doing well with resistance training and states he feels stronger  -   Expected Outcomes (P)  Patient will continue to make heart healthy eating. Increase workloads as tolerated to help achieve weight loss goals. Pt will continue to get stronger in order to perform duties at work Pt will experience less pain/discomfort with pec region when doing activity, Pt is up to 40lbs on weights with pulley system. Pt is able to do more without chest discomfort and without decrease in stamina.  Pt is able to do more activities without chest discomfort and without decrease in stamina.       Core Components/Risk Factors/Patient Goals at Discharge (Final Review):      Goals and Risk Factor Review - 02/08/16 1213      Core Components/Risk Factors/Patient Goals Review   Expected Outcomes Pt is able to do more activities without chest discomfort and without decrease in stamina.       ITP Comments:     ITP Comments    Row Name 11/15/15 1224  ITP Comments Dr Armanda Magic is the medical director of cardiac rehab          Comments: Pt is making expected progress toward personal goals after completing 33 sessions. Pt is weight lifting 40 pounds here at rehab.  Pt has returned back to work with some soreness the following day but he does not consider it a major issue.  Pt employee is working with the patient with his job duties. Psychosocial Assessment -No further psychosocial needed identified, no further intervention warranted.  Recommend continued exercise and life style modification education including  stress management and relaxation techniques to decrease cardiac risk profile.  Alanson Aly, BSN

## 2016-02-15 ENCOUNTER — Encounter (HOSPITAL_COMMUNITY)
Admission: RE | Admit: 2016-02-15 | Discharge: 2016-02-15 | Disposition: A | Discharge status: Home / Self Care | Payer: BLUE CROSS/BLUE SHIELD | Source: Ambulatory Visit | Attending: Cardiovascular Disease | Admitting: Cardiovascular Disease

## 2016-02-15 ENCOUNTER — Encounter (HOSPITAL_COMMUNITY): Payer: BLUE CROSS/BLUE SHIELD

## 2016-02-15 DIAGNOSIS — I214 Non-ST elevation (NSTEMI) myocardial infarction: Secondary | ICD-10-CM

## 2016-02-15 DIAGNOSIS — Z951 Presence of aortocoronary bypass graft: Secondary | ICD-10-CM | POA: Diagnosis not present

## 2016-02-18 ENCOUNTER — Encounter (HOSPITAL_COMMUNITY)
Admission: RE | Admit: 2016-02-18 | Discharge: 2016-02-18 | Disposition: A | Discharge status: Home / Self Care | Payer: BLUE CROSS/BLUE SHIELD | Source: Ambulatory Visit | Attending: Cardiovascular Disease | Admitting: Cardiovascular Disease

## 2016-02-18 ENCOUNTER — Encounter (HOSPITAL_COMMUNITY): Payer: BLUE CROSS/BLUE SHIELD

## 2016-02-18 VITALS — Ht 66.5 in

## 2016-02-18 DIAGNOSIS — Z951 Presence of aortocoronary bypass graft: Secondary | ICD-10-CM

## 2016-02-18 DIAGNOSIS — I214 Non-ST elevation (NSTEMI) myocardial infarction: Secondary | ICD-10-CM

## 2016-02-20 ENCOUNTER — Other Ambulatory Visit: Payer: Self-pay | Admitting: Cardiovascular Disease

## 2016-02-20 ENCOUNTER — Telehealth: Payer: Self-pay | Admitting: Cardiovascular Disease

## 2016-02-20 ENCOUNTER — Encounter (HOSPITAL_COMMUNITY)
Admission: RE | Admit: 2016-02-20 | Discharge: 2016-02-20 | Disposition: A | Discharge status: Home / Self Care | Payer: BLUE CROSS/BLUE SHIELD | Source: Ambulatory Visit | Attending: Cardiovascular Disease | Admitting: Cardiovascular Disease

## 2016-02-20 ENCOUNTER — Encounter (HOSPITAL_COMMUNITY): Payer: BLUE CROSS/BLUE SHIELD

## 2016-02-20 ENCOUNTER — Encounter: Payer: BLUE CROSS/BLUE SHIELD | Admitting: Cardiothoracic Surgery

## 2016-02-20 DIAGNOSIS — I214 Non-ST elevation (NSTEMI) myocardial infarction: Secondary | ICD-10-CM

## 2016-02-20 DIAGNOSIS — Z951 Presence of aortocoronary bypass graft: Secondary | ICD-10-CM | POA: Diagnosis not present

## 2016-02-20 NOTE — Telephone Encounter (Signed)
Patient needs to be seen back in the office for evaluation prior to a letter being written to the DOT

## 2016-02-20 NOTE — Telephone Encounter (Signed)
Notified Wife and Appt scheduled for 02-21-16 @830am 

## 2016-02-20 NOTE — Progress Notes (Signed)
Discharge Summary  Patient Details  Name: Justin Olson MRN: [id number] Date of Birth: Apr 25, 1965 Referring Provider:   Flowsheet Row CARDIAC REHAB PHASE II ORIENTATION from 11/13/2015 in [place]  Referring Provider  Quay Burow MD       Number of Visits: 36  Reason for Discharge:  Patient reached a stable level of exercise. Patient independent in their exercise.  Smoking History:  History  Smoking Status  . Former Smoker  . Packs/day: 1.50  . Types: Cigarettes  . Quit date: 08/28/2015  Smokeless Tobacco  . Not on file    Diagnosis:  NSTEMI (non-ST elevated myocardial infarction) ([address])  S/P CABG x 2  ADL UCSD:   Initial Exercise Prescription:     Initial Exercise Prescription - 11/13/15 1600      Date of Initial Exercise RX and Referring Provider   Date 11/13/15   Referring Provider Quay Burow MD     Treadmill   MPH 2.5   Grade 1   Minutes 10   METs 3.26     Bike   Level 1.2   Minutes 10   METs 3.75     NuStep   Level 3   Minutes 10   METs 2.8     Prescription Details   Frequency (times per week) 3   Duration Progress to 30 minutes of continuous aerobic without signs/symptoms of physical distress     Intensity   THRR 40-80% of Max Heartrate 68-136   Ratings of Perceived Exertion 11-13   Perceived Dyspnea 0-4     Progression   Progression Continue to progress workloads to maintain intensity without signs/symptoms of physical distress.     Resistance Training   Training Prescription Yes   Weight 2 lbs   Reps 10-12      Discharge Exercise Prescription (Final Exercise Prescription Changes):     Exercise Prescription Changes - 02/01/16 1600      Exercise Review   Progression Yes     Response to Exercise   Blood Pressure (Admit) 120/70   Blood Pressure (Exercise) 142/70   Blood Pressure (Exit) 118/60   Heart Rate (Admit) 71 bpm   Heart Rate (Exercise) 111 bpm   Heart Rate (Exit) 67 bpm   Rating of Perceived Exertion (Exercise) 13   Comments pt was oriented to weighs for resistance training on 12/26/15  Pt has been doing weights for 3rd station (approx. 9mn)   Duration Progress to 30 minutes of continuous aerobic without signs/symptoms of physical distress   Intensity THRR unchanged     Progression   Progression Continue to progress workloads to maintain intensity without signs/symptoms of physical distress.   Average METs 5.4     Resistance Training   Training Prescription Yes   Weight 5lbs   Reps 10-12     Interval Training   Interval Training No     Treadmill   MPH 3.6   Grade 4   Minutes 10   METs 5.74     Bike   Level 1.8   Minutes 10   METs 5.01     Home Exercise Plan   Plans to continue exercise at Home   Frequency Add 4 additional days to program exercise sessions.      Functional Capacity:     6 Minute Walk    Row Name 11/13/15 1623 02/18/16 1644 02/18/16 1647     6 Minute Walk   Phase Initial Discharge  -  Distance 1383 feet 2052 feet  -   Distance % Change  - 48 %  -   Walk Time 6 minutes 6 minutes  -   # of Rest Breaks 0 0  -   MPH 2.62  - 3.9   METS 3.73  - 5.3   RPE 7  - 9   VO2 Peak 13.06  - 18.9   Symptoms No  - No   Resting HR 64 bpm  - 61 bpm   Resting BP 104/60  - 120/70   Max Ex. HR 78 bpm  - 110 bpm   Max Ex. BP 112/72  - 144/92   2 Minute Post BP 108/60  - 120/80      Psychological, QOL, Others - Outcomes: PHQ 2/9: Depression screen [facility] 2/9 02/20/2016 11/19/2015 09/26/2015  Decreased Interest 0 0 0  Down, Depressed, Hopeless 0 0 0  PHQ - 2 Score 0 0 0    Quality of Life:     Quality of Life - 11/13/15 1608      Quality of Life Scores   Health/Function Pre 26.4 %   Socioeconomic Pre 24.86 %   Psych/Spiritual Pre 27.43 %   Family Pre 30 %   GLOBAL Pre 26.82 %      Personal Goals: Goals established at orientation with interventions provided to work toward goal.     Personal Goals and Risk Factors  at Admission - 11/13/15 1628      Core Components/Risk Factors/Patient Goals on Admission    Weight Management Weight Loss;Yes   Intervention Weight Management: Provide education and appropriate resources to help participant work on and attain dietary goals.;Weight Management: Develop a combined nutrition and exercise program designed to reach desired caloric intake, while maintaining appropriate intake of nutrient and fiber, sodium and fats, and appropriate energy expenditure required for the weight goal.   Admit Weight 181 lb (82.1 kg)   Goal Weight: Short Term 175 lb (79.4 kg)   Goal Weight: Long Term 170 lb (77.1 kg)   Expected Outcomes Short Term: Continue to assess and modify interventions until short term weight is achieved;Long Term: Adherence to nutrition and physical activity/exercise program aimed toward attainment of established weight goal   Tobacco Cessation Yes   Number of packs per day 1   Intervention Offer self-teaching materials, assist with locating and accessing local/national Quit Smoking programs, and support quit date choice.   Expected Outcomes Long Term: Complete abstinence from all tobacco products for at least 12 months from quit date.;Short Term: Will quit all tobacco product use, adhering to prevention of relapse plan.   Diabetes Yes   Intervention Provide education about proper nutrition, including hydration, and aerobic/resistive exercise prescription along with prescribed medications to achieve blood glucose in normal ranges: Fasting glucose 65-99 mg/dL;Provide education about signs/symptoms and action to take for hypo/hyperglycemia.   Expected Outcomes Short Term: Participant verbalizes understanding of the signs/symptoms and immediate care of hyper/hypoglycemia, proper foot care and importance of medication, aerobic/resistive exercise and nutrition plan for blood glucose control.;Long Term: Attainment of HbA1C < 7%.   Personal Goal Other Yes   Personal Goal Eat  healthy. Chest stronger.   Intervention Provide education about heart healthy eating.   Expected Outcomes Adherence to heart healthy diet guidelines provided by dietician.       Personal Goals Discharge:     Goals and Risk Factor Review    Row Name 11/21/15 1050 12/14/15 0824 01/16/16 0828 02/08/16 1210 02/08/16 1213  Core Components/Risk Factors/Patient Goals Review   Personal Goals Review (P)  Other Increase Strength and Stamina;Other  - Increase Strength and Stamina;Other  -   Review (P)  Patient states he's making healthier eating choices, which is one of his personal goals. Pt feels chest is healing well and is getting stronger. Pt returned to work on 12/12/15. Started strength training in [place]. Pt is currently lifting 20-30lbs on pulley system Pt is getting back on track with weightloss last reported 84.6kg and today 84kg. Returned to [employer] with minimal diiscomfort. Introduced to Charity fundraiser and  is starting to feel stronger Pt is doing well with resistance training and states he feels stronger  -   Expected Outcomes (P)  Patient will continue to make heart healthy eating. Increase workloads as tolerated to help achieve weight loss goals. Pt will continue to get stronger in order to perform duties at work Pt will experience less pain/discomfort with pec region when doing activity, Pt is up to 40lbs on weights with pulley system. Pt is able to do more without chest discomfort and without decrease in stamina.  Pt is able to do more activities without chest discomfort and without decrease in stamina.       Nutrition & Weight - Outcomes:     Pre Biometrics - 11/13/15 1621      Pre Biometrics   Height 5' 6.5" (1.689 m)   Weight 181 lb (82.1 kg)   Waist Circumference 38.25 inches   Hip Circumference 39.5 inches   Waist to Hip Ratio 0.97 %   BMI (Calculated) 28.8   Triceps Skinfold 15 mm   % Body Fat 26.7 %   Grip Strength 43.5 kg   Flexibility 13 in   Single Leg  Stand 30 seconds         Post Biometrics - 02/18/16 1648       Post  Biometrics   Height 5' 6.5" (1.689 m)   Waist Circumference 40.5 inches   Hip Circumference 42.25 inches   Waist to Hip Ratio 0.96 %   Triceps Skinfold 20 mm   % Body Fat 29.5 %   Grip Strength 45.5 kg   Flexibility 13 in   Single Leg Stand 30 seconds      Nutrition:     Nutrition Therapy & Goals - 11/19/15 1424      Nutrition Therapy   Diet Carb Modified, Therapeutic Lifestyle Changes     Personal Nutrition Goals   Personal Goal #1 1-2 lb wt loss/week to a goal wt loss of 6-24 lb at graduation from Cardiac Rehab   Personal Goal #2 Improved glycemic control as evidenced by an A1c decrease from 7.9 toward a goal of less than 7.0     Intervention Plan   Intervention Prescribe, educate and counsel regarding individualized specific dietary modifications aiming towards targeted core components such as weight, hypertension, lipid management, diabetes, heart failure and other comorbidities.   Expected Outcomes Short Term Goal: Understand basic principles of dietary content, such as calories, fat, sodium, cholesterol and nutrients.;Long Term Goal: Adherence to prescribed nutrition plan.      Nutrition Discharge:     Nutrition Assessments - 11/23/15 1139      MEDFICTS Scores   Pre Score 42      Education Questionnaire Score:     Knowledge Questionnaire Score - 11/19/15 1424      Knowledge Questionnaire Score   Pre Score 19/24     DM 14/15  Goals reviewed with patient. Pt graduated from cardiac rehab program today with completion of 36 exercise sessions in Phase II. Pt maintained good attendance and progressed nicely during his participation in rehab as evidenced by increased MET level. Pt able to lift 40 pounds for weight training.  This will be helpful for pt work which involves heavy lifting.  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  Pt last day is  today. Pt given rehab report for documentation of his cardiac rehab progress.    Medication list reconciled. Repeat  PHQ score-0  .  Pt has made significant lifestyle changes and should be commended for his success. Pt feels he has achieved his goals during cardiac rehab. Pt chest feels stronger.  Pt did not reach his goal weight of 170 pounds.  Pt weighs 184.8.  Pt feels he has the tools to be successful in weight loss.  Pt plans to continue exercise in cardiac maintenance program in December. We look forward to working with this pt in the maintenance program. Maurice Small RN, BSN

## 2016-02-20 NOTE — Telephone Encounter (Signed)
Spoke with wife, Misty StanleyLisa, she states that she needs a letter for his DOT card. 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 Wife states that his DOT card expires today and needs letter today. please fax letter  to [phone] or [phone]

## 2016-02-20 NOTE — Telephone Encounter (Signed)
Please call,pt needs additional information for his DOT physical. His wife will give you all the details when you call.

## 2016-02-21 ENCOUNTER — Ambulatory Visit: Payer: BLUE CROSS/BLUE SHIELD | Admitting: Nurse Practitioner

## 2016-02-21 ENCOUNTER — Ambulatory Visit: Payer: BLUE CROSS/BLUE SHIELD | Admitting: Physician Assistant

## 2016-02-22 ENCOUNTER — Encounter (HOSPITAL_COMMUNITY): Payer: BLUE CROSS/BLUE SHIELD

## 2016-02-25 ENCOUNTER — Encounter (HOSPITAL_COMMUNITY): Payer: BLUE CROSS/BLUE SHIELD

## 2016-02-27 ENCOUNTER — Encounter (HOSPITAL_COMMUNITY): Payer: BLUE CROSS/BLUE SHIELD

## 2016-02-27 ENCOUNTER — Ambulatory Visit (INDEPENDENT_AMBULATORY_CARE_PROVIDER_SITE_OTHER): Payer: BLUE CROSS/BLUE SHIELD | Admitting: Cardiothoracic Surgery

## 2016-02-27 ENCOUNTER — Encounter: Payer: Self-pay | Admitting: Cardiothoracic Surgery

## 2016-02-27 VITALS — BP 98/70 | HR 70 | Resp 16 | Ht 66.5 in | Wt 184.0 lb

## 2016-02-27 DIAGNOSIS — M792 Neuralgia and neuritis, unspecified: Secondary | ICD-10-CM

## 2016-02-27 DIAGNOSIS — Z951 Presence of aortocoronary bypass graft: Secondary | ICD-10-CM | POA: Diagnosis not present

## 2016-02-27 NOTE — Progress Notes (Signed)
PCP is Redmond BasemanWONG,FRANCIS PATRICK, MD Referring Provider is Runell GessBerry, Jonathan J, MD  Chief Complaint  Patient presents with  . Routine Post Op    1 month f/u s/p CABG 08/30/15 to continue assessing neurogenic pain    ZOX:[id number]:Final visit now almost 6 months after multivessel CABG. The patient has had persistent chest wall incisional pain and tenderness hypersensitivity which is limited to his activities and lifting ability. CT scan showed no evidence of fibrous malunion. There is been no evidence of infection. After weeks of therapy the patient now is able to return to work successfully. The pain now is equated to a bad sunburn-hypersensitivity of the skin. He is not taking any pain medication. He plans on entering the maintenance program at outpatient cardiac rehabilitation which I have encouraged.   Past Medical History:  Diagnosis Date  . Coronary artery disease   . Diabetes mellitus type 2, noninsulin dependent (HCC) 08/2015  . Dyslipidemia associated with type 2 diabetes mellitus (HCC) 08/2015  . Tobacco abuse     Past Surgical History:  Procedure Laterality Date  . CARDIAC CATHETERIZATION N/A 08/28/2015   Procedure: Left Heart Cath and Coronary Angiography;  Surgeon: Runell GessJonathan J Berry, MD;  [facility];  Service: Cardiovascular;  Laterality: N/A;  . CORONARY ARTERY BYPASS GRAFT N/A 08/30/2015   Procedure: CORONARY ARTERY BYPASS GRAFTING (CABG) x2 using left internal mammary artery and right greater saphenous vein. ;  Surgeon: Kerin PernaPeter Van Trigt, MD;  LIMA-LAD, SVG-RI  . TEE WITHOUT CARDIOVERSION N/A 08/30/2015   Procedure: TRANSESOPHAGEAL ECHOCARDIOGRAM (TEE);  Surgeon: Kerin PernaPeter Van Trigt, MD;  [facility];  Service: Open Heart Surgery;  Laterality: N/A;    Family History  Problem Relation Age of Onset  . Coronary artery disease Father     MI  . Coronary artery disease Sister     s/p CABG x5    Social History Social History  Substance Use Topics  . Smoking status: Former  Smoker    Packs/day: 1.50    Types: Cigarettes    Quit date: 08/28/2015  . Smokeless tobacco: Not on file  . Alcohol use 0.0 oz/week     Comment: only weekends 3-4 beers entire weekend    Current Outpatient Prescriptions  Medication Sig Dispense Refill  . aspirin 81 MG tablet Take 81 mg by mouth daily.    Marland Kitchen. atorvastatin (LIPITOR) 80 MG tablet Take 1 tablet (80 mg total) by mouth daily at 6 PM. 30 tablet 1  . Blood Glucose Monitoring Suppl (CONTOUR BLOOD GLUCOSE SYSTEM) DEVI Inject 1 Device as directed 2 (two) times daily. 1 Device 0  . glucose blood (FREESTYLE LITE) test strip Test once daily before breakfast 50 each 12  . Lancets (FREESTYLE) lancets Use as instructed 100 each 12  . metFORMIN (GLUCOPHAGE) 500 MG tablet Take 1 tablet (500 mg total) by mouth daily with breakfast. (Patient taking differently: Take 500 mg by mouth 2 (two) times daily with a meal. ) 30 tablet 1  . metoprolol tartrate (LOPRESSOR) 25 MG tablet Take 12.5 mg by mouth 2 (two) times daily.     No current facility-administered medications for this visit.     No Known Allergies  Review of Systems  No change in weight or fever no clicks or pops and the sternal incision No shortness of breath orthopnea No ankle edema or abdominal pain No presyncope or dizziness   BP 98/70 (BP Location: Right Arm, Patient Position: Sitting, Cuff Size: Normal)   Pulse 70  Resp 16   Ht 5' 6.5" (1.689 m)   Wt 184 lb (83.5 kg)   SpO2 98% Comment: ON RA  BMI 29.25 kg/m  Physical Exam      Exam    General- alert and comfortable   Lungs- clear without rales, wheezes   Cor- regular rate and rhythm, no murmur , gallop   Abdomen- soft, non-tender   Extremities - warm, non-tender, minimal edema   Neuro- oriented, appropriate, no focal weakness   Diagnostic Tests:  None  Impression: Chest incision is healed Continue current medications No limitations on lifting other than what feels comfortable to the patient  Plan  Return as needed  Mikey BussingPeter Van Trigt III, MD [facility] [phone]

## 2016-02-29 ENCOUNTER — Encounter (HOSPITAL_COMMUNITY): Payer: BLUE CROSS/BLUE SHIELD

## 2016-03-03 ENCOUNTER — Encounter (HOSPITAL_COMMUNITY): Payer: BLUE CROSS/BLUE SHIELD

## 2016-03-05 ENCOUNTER — Encounter (HOSPITAL_COMMUNITY): Payer: BLUE CROSS/BLUE SHIELD

## 2016-03-07 ENCOUNTER — Encounter (HOSPITAL_COMMUNITY): Admission: RE | Admit: 2016-03-07 | Payer: Self-pay | Source: Ambulatory Visit

## 2016-03-07 ENCOUNTER — Encounter (HOSPITAL_COMMUNITY): Payer: BLUE CROSS/BLUE SHIELD

## 2016-03-10 ENCOUNTER — Encounter (HOSPITAL_COMMUNITY): Payer: BLUE CROSS/BLUE SHIELD

## 2016-03-10 ENCOUNTER — Encounter (HOSPITAL_COMMUNITY)
Admission: RE | Admit: 2016-03-10 | Discharge: 2016-03-10 | Disposition: A | Discharge status: Home / Self Care | Payer: Self-pay | Source: Ambulatory Visit | Attending: Cardiovascular Disease | Admitting: Cardiovascular Disease

## 2016-03-10 DIAGNOSIS — Z23 Encounter for immunization: Secondary | ICD-10-CM | POA: Diagnosis not present

## 2016-03-10 DIAGNOSIS — G2581 Restless legs syndrome: Secondary | ICD-10-CM | POA: Diagnosis not present

## 2016-03-10 DIAGNOSIS — I214 Non-ST elevation (NSTEMI) myocardial infarction: Secondary | ICD-10-CM | POA: Insufficient documentation

## 2016-03-10 DIAGNOSIS — E785 Hyperlipidemia, unspecified: Secondary | ICD-10-CM | POA: Diagnosis not present

## 2016-03-10 DIAGNOSIS — Z951 Presence of aortocoronary bypass graft: Secondary | ICD-10-CM | POA: Diagnosis not present

## 2016-03-10 DIAGNOSIS — E119 Type 2 diabetes mellitus without complications: Secondary | ICD-10-CM | POA: Diagnosis not present

## 2016-03-10 DIAGNOSIS — I2510 Atherosclerotic heart disease of native coronary artery without angina pectoris: Secondary | ICD-10-CM | POA: Diagnosis not present

## 2016-03-12 ENCOUNTER — Encounter (HOSPITAL_COMMUNITY): Payer: Self-pay

## 2016-03-12 ENCOUNTER — Encounter (HOSPITAL_COMMUNITY): Payer: BLUE CROSS/BLUE SHIELD

## 2016-03-13 ENCOUNTER — Encounter (HOSPITAL_COMMUNITY)
Admission: RE | Admit: 2016-03-13 | Discharge: 2016-03-13 | Disposition: A | Discharge status: Home / Self Care | Payer: Self-pay | Source: Ambulatory Visit | Attending: Cardiovascular Disease | Admitting: Cardiovascular Disease

## 2016-03-14 ENCOUNTER — Encounter (HOSPITAL_COMMUNITY): Payer: Self-pay

## 2016-03-14 ENCOUNTER — Encounter (HOSPITAL_COMMUNITY): Payer: BLUE CROSS/BLUE SHIELD

## 2016-03-17 ENCOUNTER — Encounter (HOSPITAL_COMMUNITY)
Admission: RE | Admit: 2016-03-17 | Discharge: 2016-03-17 | Disposition: A | Discharge status: Home / Self Care | Payer: Self-pay | Source: Ambulatory Visit | Attending: Cardiovascular Disease | Admitting: Cardiovascular Disease

## 2016-03-17 ENCOUNTER — Encounter (HOSPITAL_COMMUNITY): Payer: BLUE CROSS/BLUE SHIELD

## 2016-03-19 ENCOUNTER — Encounter (HOSPITAL_COMMUNITY): Payer: Self-pay

## 2016-03-21 ENCOUNTER — Encounter (HOSPITAL_COMMUNITY): Payer: Self-pay

## 2016-03-24 ENCOUNTER — Encounter (HOSPITAL_COMMUNITY): Payer: Self-pay

## 2016-03-26 ENCOUNTER — Encounter (HOSPITAL_COMMUNITY): Payer: Self-pay

## 2016-03-28 ENCOUNTER — Encounter (HOSPITAL_COMMUNITY): Payer: Self-pay

## 2016-04-02 ENCOUNTER — Encounter (HOSPITAL_COMMUNITY): Payer: Self-pay

## 2016-04-04 ENCOUNTER — Encounter (HOSPITAL_COMMUNITY): Payer: Self-pay

## 2016-04-09 ENCOUNTER — Encounter (HOSPITAL_COMMUNITY): Payer: Self-pay | Attending: Cardiovascular Disease

## 2016-04-09 ENCOUNTER — Telehealth (HOSPITAL_COMMUNITY): Payer: Self-pay | Admitting: *Deleted

## 2016-04-09 DIAGNOSIS — I214 Non-ST elevation (NSTEMI) myocardial infarction: Secondary | ICD-10-CM | POA: Insufficient documentation

## 2016-04-11 ENCOUNTER — Encounter (HOSPITAL_COMMUNITY): Payer: Self-pay

## 2016-04-14 ENCOUNTER — Encounter (HOSPITAL_COMMUNITY): Payer: Self-pay

## 2016-04-16 ENCOUNTER — Encounter (HOSPITAL_COMMUNITY): Payer: Self-pay

## 2016-04-18 ENCOUNTER — Encounter (HOSPITAL_COMMUNITY): Payer: Self-pay

## 2016-04-21 ENCOUNTER — Encounter (HOSPITAL_COMMUNITY): Payer: Self-pay

## 2016-04-23 ENCOUNTER — Encounter (HOSPITAL_COMMUNITY): Payer: Self-pay

## 2016-04-25 ENCOUNTER — Encounter (HOSPITAL_COMMUNITY): Payer: Self-pay

## 2016-04-28 ENCOUNTER — Encounter (HOSPITAL_COMMUNITY): Payer: Self-pay

## 2016-04-30 ENCOUNTER — Encounter (HOSPITAL_COMMUNITY): Payer: Self-pay

## 2016-05-02 ENCOUNTER — Encounter (HOSPITAL_COMMUNITY): Payer: Self-pay

## 2016-05-05 ENCOUNTER — Encounter (HOSPITAL_COMMUNITY): Payer: Self-pay

## 2016-05-07 ENCOUNTER — Encounter (HOSPITAL_COMMUNITY): Payer: Self-pay

## 2016-05-09 ENCOUNTER — Encounter (HOSPITAL_COMMUNITY): Payer: Self-pay

## 2016-05-12 ENCOUNTER — Encounter (HOSPITAL_COMMUNITY): Payer: Self-pay

## 2016-05-14 ENCOUNTER — Encounter (HOSPITAL_COMMUNITY): Payer: Self-pay

## 2016-05-16 ENCOUNTER — Encounter (HOSPITAL_COMMUNITY): Payer: Self-pay

## 2016-05-19 ENCOUNTER — Encounter (HOSPITAL_COMMUNITY): Payer: Self-pay

## 2016-05-21 ENCOUNTER — Encounter (HOSPITAL_COMMUNITY): Payer: Self-pay

## 2016-05-23 ENCOUNTER — Encounter (HOSPITAL_COMMUNITY): Payer: Self-pay

## 2016-05-26 ENCOUNTER — Encounter (HOSPITAL_COMMUNITY): Payer: Self-pay

## 2016-05-28 ENCOUNTER — Encounter (HOSPITAL_COMMUNITY): Payer: Self-pay

## 2016-05-30 ENCOUNTER — Encounter (HOSPITAL_COMMUNITY): Payer: Self-pay

## 2016-06-02 ENCOUNTER — Encounter (HOSPITAL_COMMUNITY): Payer: Self-pay

## 2016-06-04 ENCOUNTER — Encounter (HOSPITAL_COMMUNITY): Payer: Self-pay

## 2016-06-06 ENCOUNTER — Encounter (HOSPITAL_COMMUNITY): Payer: Self-pay

## 2016-06-09 ENCOUNTER — Encounter (HOSPITAL_COMMUNITY): Payer: Self-pay

## 2016-06-11 ENCOUNTER — Encounter (HOSPITAL_COMMUNITY): Payer: Self-pay

## 2016-06-13 ENCOUNTER — Encounter (HOSPITAL_COMMUNITY): Payer: Self-pay

## 2016-06-16 ENCOUNTER — Encounter (HOSPITAL_COMMUNITY): Payer: Self-pay

## 2016-06-18 ENCOUNTER — Encounter (HOSPITAL_COMMUNITY): Payer: Self-pay

## 2016-06-20 ENCOUNTER — Encounter (HOSPITAL_COMMUNITY): Payer: Self-pay

## 2016-06-23 ENCOUNTER — Encounter (HOSPITAL_COMMUNITY): Payer: Self-pay

## 2016-06-25 ENCOUNTER — Encounter (HOSPITAL_COMMUNITY): Payer: Self-pay

## 2016-06-27 ENCOUNTER — Encounter (HOSPITAL_COMMUNITY): Payer: Self-pay

## 2016-06-30 ENCOUNTER — Encounter (HOSPITAL_COMMUNITY): Payer: Self-pay

## 2016-07-02 ENCOUNTER — Encounter (HOSPITAL_COMMUNITY): Payer: Self-pay

## 2016-07-04 ENCOUNTER — Encounter (HOSPITAL_COMMUNITY): Payer: Self-pay

## 2016-07-07 ENCOUNTER — Encounter (HOSPITAL_COMMUNITY): Payer: Self-pay

## 2016-07-09 ENCOUNTER — Encounter (HOSPITAL_COMMUNITY): Payer: Self-pay

## 2016-07-11 ENCOUNTER — Encounter (HOSPITAL_COMMUNITY): Payer: Self-pay

## 2016-07-14 ENCOUNTER — Encounter (HOSPITAL_COMMUNITY): Payer: Self-pay

## 2016-07-16 ENCOUNTER — Encounter (HOSPITAL_COMMUNITY): Payer: Self-pay

## 2016-07-18 ENCOUNTER — Encounter (HOSPITAL_COMMUNITY): Payer: Self-pay

## 2016-07-21 ENCOUNTER — Encounter (HOSPITAL_COMMUNITY): Payer: Self-pay

## 2016-07-23 ENCOUNTER — Encounter (HOSPITAL_COMMUNITY): Payer: Self-pay

## 2016-07-25 ENCOUNTER — Encounter (HOSPITAL_COMMUNITY): Payer: Self-pay

## 2016-07-28 ENCOUNTER — Encounter (HOSPITAL_COMMUNITY): Payer: Self-pay

## 2016-07-30 ENCOUNTER — Encounter (HOSPITAL_COMMUNITY): Payer: Self-pay

## 2016-08-01 ENCOUNTER — Encounter (HOSPITAL_COMMUNITY): Payer: Self-pay

## 2016-08-04 ENCOUNTER — Encounter (HOSPITAL_COMMUNITY): Payer: Self-pay

## 2016-08-06 ENCOUNTER — Encounter (HOSPITAL_COMMUNITY): Payer: Self-pay

## 2016-08-08 ENCOUNTER — Encounter (HOSPITAL_COMMUNITY): Payer: Self-pay

## 2016-08-11 ENCOUNTER — Encounter (HOSPITAL_COMMUNITY): Payer: Self-pay

## 2016-08-13 ENCOUNTER — Encounter (HOSPITAL_COMMUNITY): Payer: Self-pay

## 2016-08-15 ENCOUNTER — Encounter (HOSPITAL_COMMUNITY): Payer: Self-pay

## 2016-08-18 ENCOUNTER — Encounter (HOSPITAL_COMMUNITY): Payer: Self-pay

## 2016-08-20 ENCOUNTER — Encounter (HOSPITAL_COMMUNITY): Payer: Self-pay

## 2016-08-22 ENCOUNTER — Encounter (HOSPITAL_COMMUNITY): Payer: Self-pay

## 2016-08-25 ENCOUNTER — Encounter (HOSPITAL_COMMUNITY): Payer: Self-pay

## 2016-08-27 ENCOUNTER — Encounter (HOSPITAL_COMMUNITY): Payer: Self-pay

## 2016-08-29 ENCOUNTER — Encounter (HOSPITAL_COMMUNITY): Payer: Self-pay

## 2016-09-03 ENCOUNTER — Encounter (HOSPITAL_COMMUNITY): Payer: Self-pay

## 2016-09-12 DIAGNOSIS — E785 Hyperlipidemia, unspecified: Secondary | ICD-10-CM | POA: Diagnosis not present

## 2016-09-12 DIAGNOSIS — E119 Type 2 diabetes mellitus without complications: Secondary | ICD-10-CM | POA: Diagnosis not present

## 2016-09-12 DIAGNOSIS — G2581 Restless legs syndrome: Secondary | ICD-10-CM | POA: Diagnosis not present

## 2016-09-12 DIAGNOSIS — I2510 Atherosclerotic heart disease of native coronary artery without angina pectoris: Secondary | ICD-10-CM | POA: Diagnosis not present

## 2017-03-27 DIAGNOSIS — Z951 Presence of aortocoronary bypass graft: Secondary | ICD-10-CM | POA: Diagnosis not present

## 2017-03-27 DIAGNOSIS — E785 Hyperlipidemia, unspecified: Secondary | ICD-10-CM | POA: Diagnosis not present

## 2017-03-27 DIAGNOSIS — Z23 Encounter for immunization: Secondary | ICD-10-CM | POA: Diagnosis not present

## 2017-03-27 DIAGNOSIS — E1165 Type 2 diabetes mellitus with hyperglycemia: Secondary | ICD-10-CM | POA: Diagnosis not present

## 2017-03-27 DIAGNOSIS — Z87891 Personal history of nicotine dependence: Secondary | ICD-10-CM | POA: Diagnosis not present

## 2017-04-08 DIAGNOSIS — I2510 Atherosclerotic heart disease of native coronary artery without angina pectoris: Secondary | ICD-10-CM | POA: Diagnosis not present

## 2017-04-08 DIAGNOSIS — Z951 Presence of aortocoronary bypass graft: Secondary | ICD-10-CM | POA: Diagnosis not present

## 2017-04-08 DIAGNOSIS — E119 Type 2 diabetes mellitus without complications: Secondary | ICD-10-CM | POA: Diagnosis not present

## 2017-04-08 DIAGNOSIS — Z794 Long term (current) use of insulin: Secondary | ICD-10-CM | POA: Diagnosis not present

## 2017-04-13 DIAGNOSIS — E119 Type 2 diabetes mellitus without complications: Secondary | ICD-10-CM | POA: Diagnosis not present

## 2017-06-04 DIAGNOSIS — Z794 Long term (current) use of insulin: Secondary | ICD-10-CM | POA: Diagnosis not present

## 2017-06-04 DIAGNOSIS — Z951 Presence of aortocoronary bypass graft: Secondary | ICD-10-CM | POA: Diagnosis not present

## 2017-06-04 DIAGNOSIS — E119 Type 2 diabetes mellitus without complications: Secondary | ICD-10-CM | POA: Diagnosis not present

## 2017-06-04 DIAGNOSIS — I2510 Atherosclerotic heart disease of native coronary artery without angina pectoris: Secondary | ICD-10-CM | POA: Diagnosis not present

## 2017-10-16 DIAGNOSIS — Z951 Presence of aortocoronary bypass graft: Secondary | ICD-10-CM | POA: Diagnosis not present

## 2017-10-16 DIAGNOSIS — E785 Hyperlipidemia, unspecified: Secondary | ICD-10-CM | POA: Diagnosis not present

## 2017-10-16 DIAGNOSIS — E1165 Type 2 diabetes mellitus with hyperglycemia: Secondary | ICD-10-CM | POA: Diagnosis not present

## 2017-10-16 DIAGNOSIS — Z87891 Personal history of nicotine dependence: Secondary | ICD-10-CM | POA: Diagnosis not present

## 2018-04-08 IMAGING — CT CT CHEST W/ CM
2 of 3 series · 16 of 30 positions shown, 19 images · IV contrast (75CC ISOVUE 300)
Comparison: None.

CLINICAL DATA: Sternal  malunion.

EXAM:
CT CHEST WITH CONTRAST
TECHNIQUE: Multidetector CT imaging of the chest was performed during
intravenous contrast administration.
CONTRAST:  75mL YEVM2W-VUU IOPAMIDOL (YEVM2W-VUU) INJECTION 61%

[Series 3: chest with · axial · 0.78mm/px · z∈[-226,+8]mm · 10 of 116 slices shown, 13 images]
[im 11/116  mediastinal]
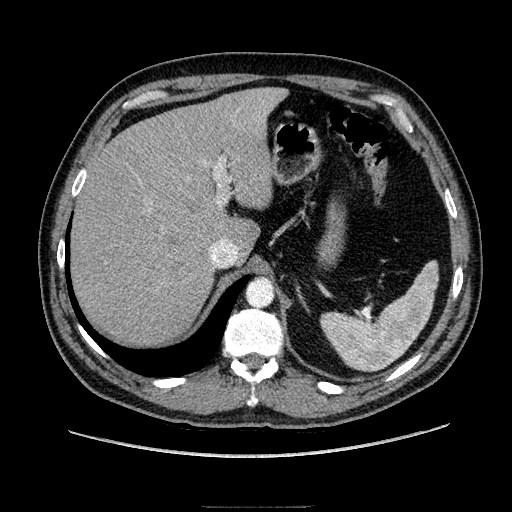
[im 11/116  lung]
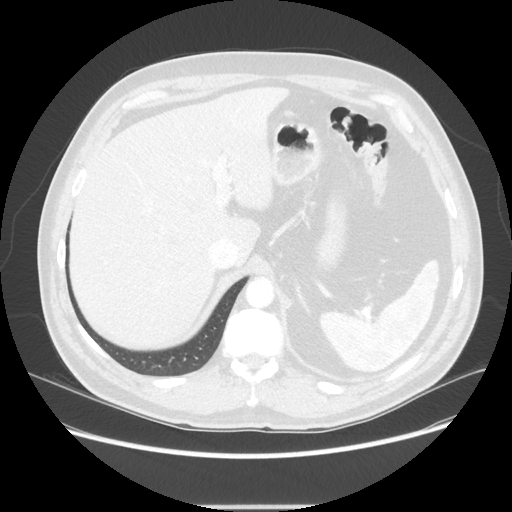
[im 21/116  lung]
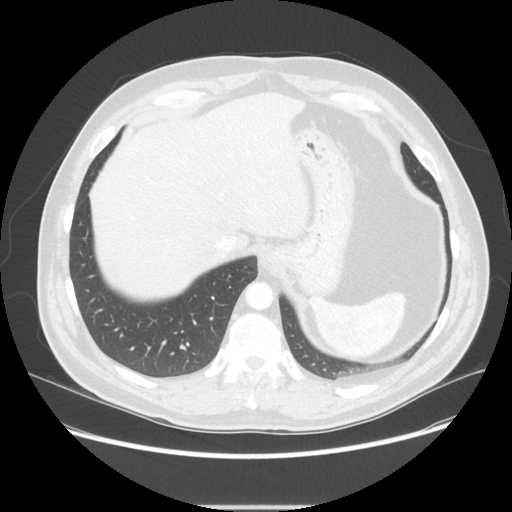
[im 32/116  lung]
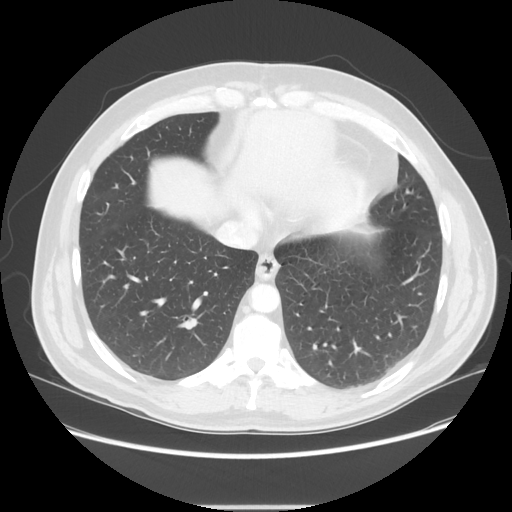
[im 42/116  lung]
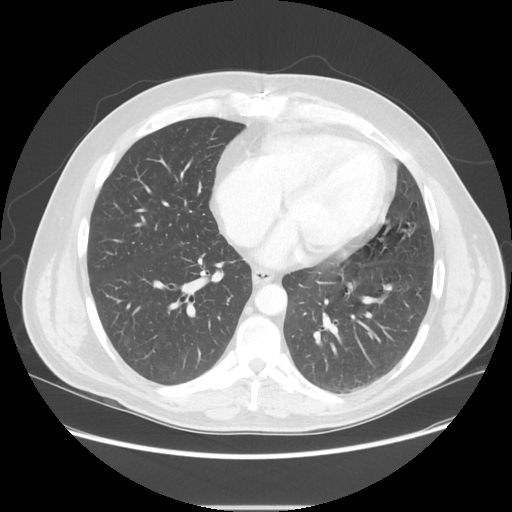
[im 53/116  mediastinal]
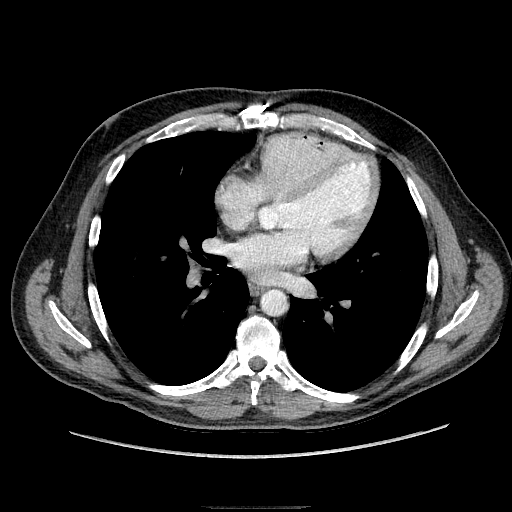
[im 53/116  lung]
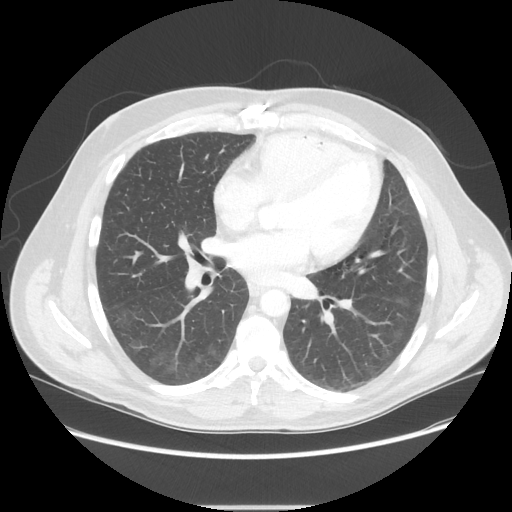
[im 63/116  lung]
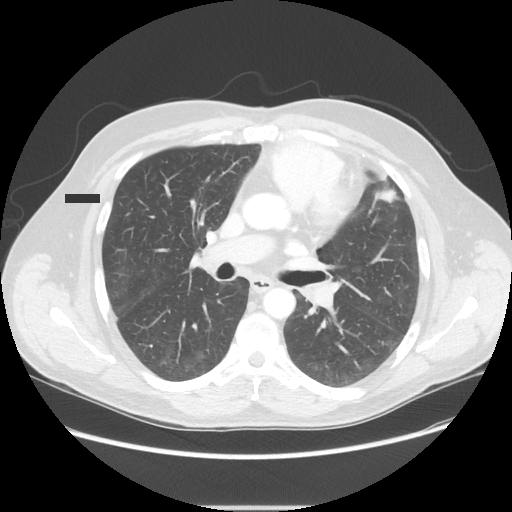
[im 74/116  lung]
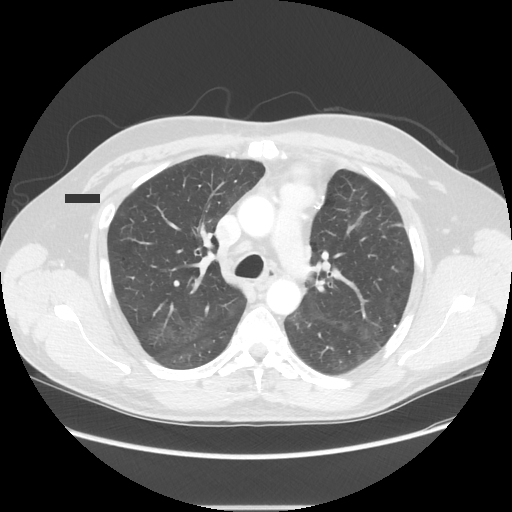
[im 84/116  lung]
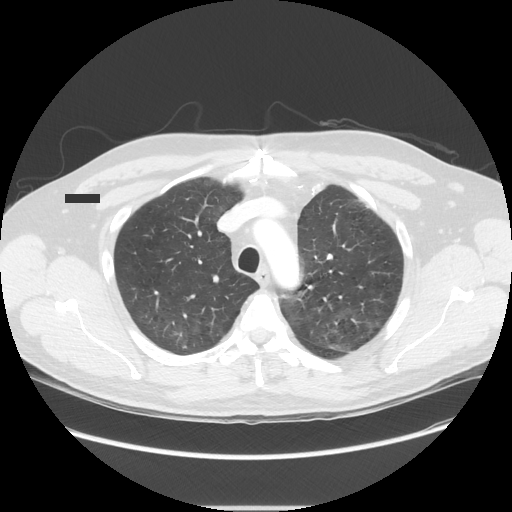
[im 95/116  mediastinal]
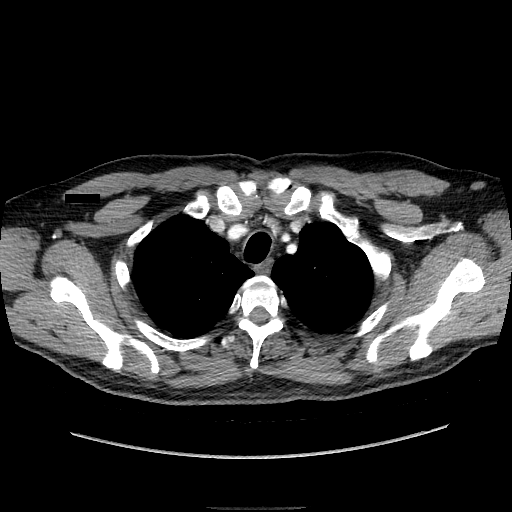
[im 95/116  lung]
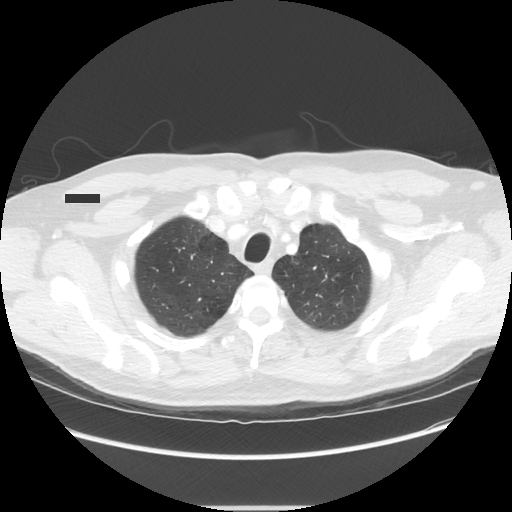
[im 105/116  lung]
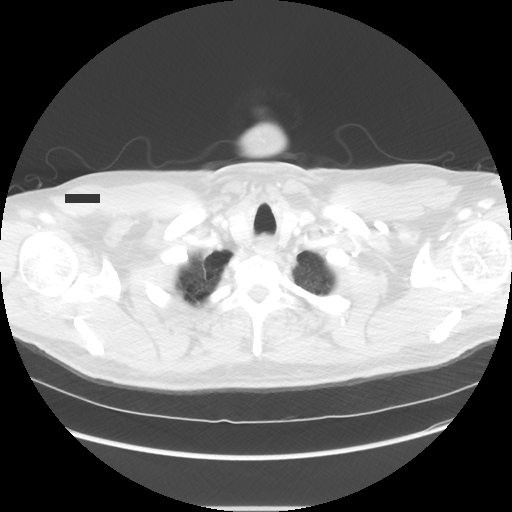

[Series 602: sagittal body · sagittal · 0.78mm/px · 6 of 161 slices shown]
[im 11/161  mediastinal]
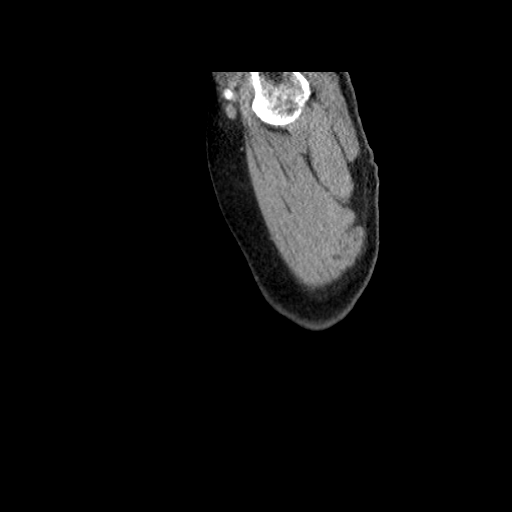
[im 33/161  mediastinal]
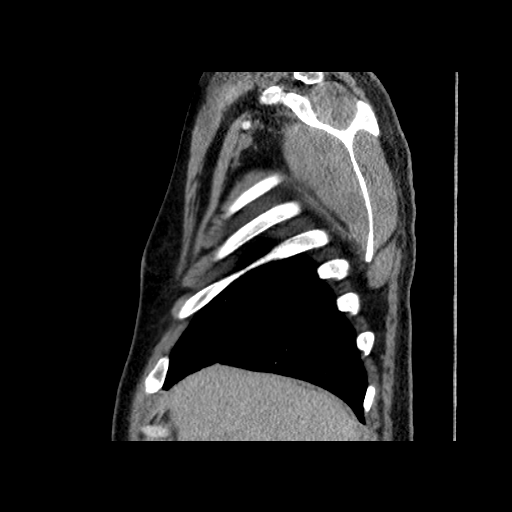
[im 54/161  mediastinal]
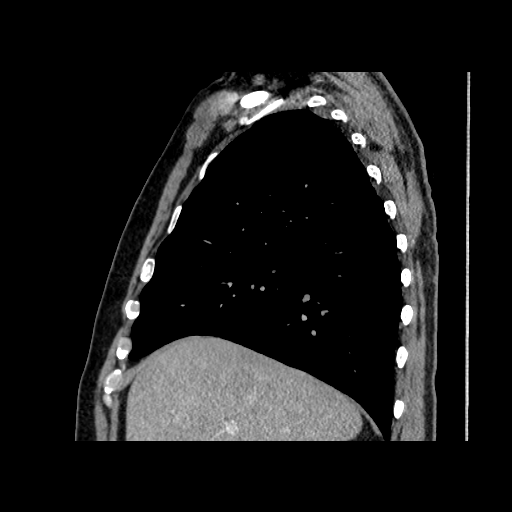
[im 75/161  mediastinal]
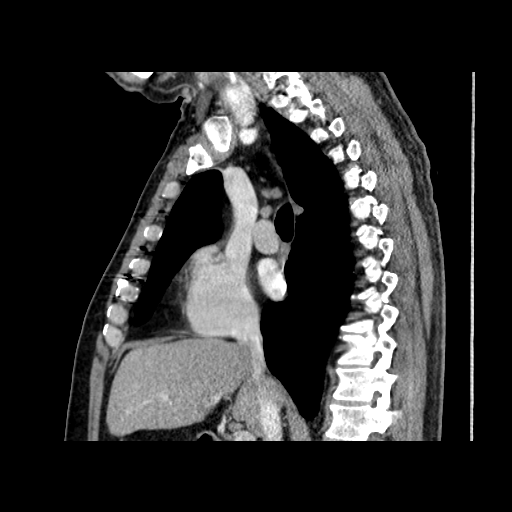
[im 86/161  mediastinal]
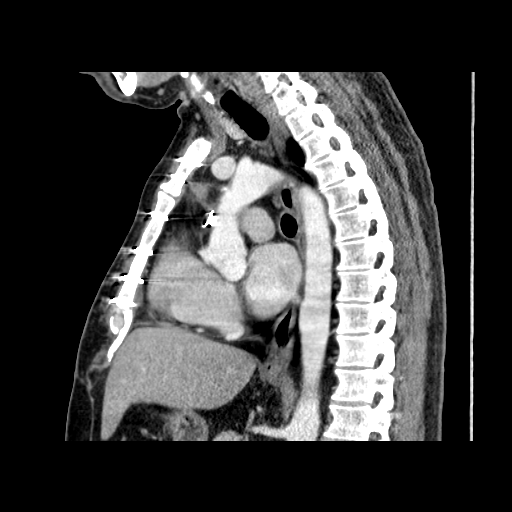
[im 107/161  mediastinal]
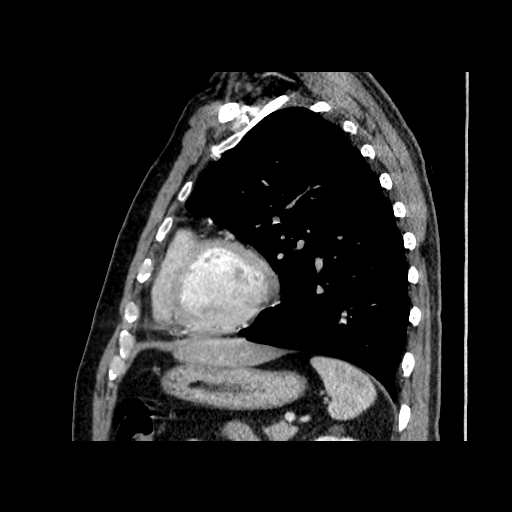

[16 of 30 positions shown; findings below may reference images not displayed]

FINDINGS: Cardiovascular: The heart size is normal. No pericardial effusion.
Patient is status post CABG. No thoracic aortic aneurysm.

Mediastinum/Nodes: No mediastinal lymphadenopathy. There is no hilar
lymphadenopathy. The esophagus has normal imaging features. There is
no axillary lymphadenopathy.

Wispy soft tissue attenuation in the anterior mediastinum likely
related to previous surgery.

Lungs/Pleura: Centrilobular and paraseptal emphysema noted
bilaterally. 3 mm right middle lobe pulmonary nodule seen on image
73 series for. Lingular scarring noted. No pulmonary edema or
pleural effusion. No focal airspace consolidation.

Upper Abdomen: 3.0 cm exophytic well-defined water density lesion
arising from the upper pole left kidney is incompletely visualized
but likely represents a cyst. Otherwise unremarkable.

Musculoskeletal: Patient is status post median sternotomy with
fracture line still visible. No evidence for fluid collection along
the sternum to suggest superinfection. Bone windows reveal no
worrisome lytic or sclerotic osseous lesions.
IMPRESSION: No evidence for parasternal fluid collection with nonunion of median
sternotomy site.

Emphysema.

## 2018-04-23 DIAGNOSIS — Z87891 Personal history of nicotine dependence: Secondary | ICD-10-CM | POA: Diagnosis not present

## 2018-04-23 DIAGNOSIS — E1165 Type 2 diabetes mellitus with hyperglycemia: Secondary | ICD-10-CM | POA: Diagnosis not present

## 2018-04-23 DIAGNOSIS — E785 Hyperlipidemia, unspecified: Secondary | ICD-10-CM | POA: Diagnosis not present

## 2018-04-23 DIAGNOSIS — Z951 Presence of aortocoronary bypass graft: Secondary | ICD-10-CM | POA: Diagnosis not present

## 2018-06-02 ENCOUNTER — Encounter: Payer: Self-pay | Admitting: Cardiology

## 2018-06-02 ENCOUNTER — Ambulatory Visit: Payer: BC Managed Care – PPO | Admitting: Cardiology

## 2018-06-02 VITALS — BP 130/84 | HR 75 | Ht 66.5 in | Wt 189.0 lb

## 2018-06-02 DIAGNOSIS — Z951 Presence of aortocoronary bypass graft: Secondary | ICD-10-CM | POA: Diagnosis not present

## 2018-06-02 DIAGNOSIS — E7800 Pure hypercholesterolemia, unspecified: Secondary | ICD-10-CM

## 2018-06-02 DIAGNOSIS — E119 Type 2 diabetes mellitus without complications: Secondary | ICD-10-CM

## 2018-06-02 DIAGNOSIS — I2510 Atherosclerotic heart disease of native coronary artery without angina pectoris: Secondary | ICD-10-CM | POA: Diagnosis not present

## 2018-06-02 MED ORDER — LISINOPRIL 20 MG PO TABS: 20.0000 mg | ORAL_TABLET | Freq: Every day | ORAL | 3 refills | Status: DC

## 2018-06-02 MED ORDER — NITROGLYCERIN 0.4 MG SL SUBL: 0.4000 mg | SUBLINGUAL_TABLET | SUBLINGUAL | 3 refills | Status: DC | PRN

## 2018-06-02 NOTE — Progress Notes (Signed)
Subjective:  Primary Physician:  Vernie Shanks, MD  Patient ID: Justin Olson, male    DOB: May 05, 1965, 53 y.o.   MRN: [id number]  Chief Complaint  Patient presents with  . Coronary Artery Disease    HPI: Justin Olson  is a 53 y.o. male  with history of heavy tobacco use disorder, diabetes and hyperlipidemia. family history of premature coronary artery disease with NSTEMI on 08/27/2016 with non-ST elevation myocardial infarction. Due to ostial left main, 95% stenosis underwent emergent CABG with LIMA to LAD and SVG to ramus intermediate on 08/30/2015 by Ivin Poot, MD. He has normal LV systolic function.  Nuclear stress test in June of 2017 had revealed mild decrease in LVEF at 45% without ischemia, considered low risk. He presents for annual visit. He is presently asymptomatic Except for occasional episodes of left upper chest discomfort that comes with or without activity, mostly with activity but states that he does not stop him from doing things and continues to remain active and goes away after he rubs his chest. States that he remains active at his job.  He has remained abstinent from tobacco.  Denies symptoms of claudication or TIA.  Past Medical History:  Diagnosis Date  . Coronary artery disease   . Diabetes mellitus type 2, noninsulin dependent ([place]) 08/2015  . Dyslipidemia associated with type 2 diabetes mellitus ([place]) 08/2015  . Tobacco abuse     Past Surgical History:  Procedure Laterality Date  . CARDIAC CATHETERIZATION N/A 08/28/2015   Procedure: Left Heart Cath and Coronary Angiography;  Surgeon: Lorretta Harp, MD;  Location: [facility];  Service: Cardiovascular;  Laterality: N/A;  . CORONARY ARTERY BYPASS GRAFT N/A 08/30/2015   Procedure: CORONARY ARTERY BYPASS GRAFTING (CABG) x2 using left internal mammary artery and right greater saphenous vein. ;  Surgeon: Ivin Poot, MD;  LIMA-LAD, SVG-RI  . TEE WITHOUT CARDIOVERSION N/A 08/30/2015   Procedure:  TRANSESOPHAGEAL ECHOCARDIOGRAM (TEE);  Surgeon: Ivin Poot, MD;  Location: [facility];  Service: Open Heart Surgery;  Laterality: N/A;    Social History   Socioeconomic History  . Marital status: Single    Spouse name: Not on file  . Number of children: Not on file  . Years of education: Not on file  . Highest education level: Not on file  Occupational History  . Not on file  Social Needs  . Financial resource strain: Not on file  . Food insecurity:    Worry: Not on file    Inability: Not on file  . Transportation needs:    Medical: Not on file    Non-medical: Not on file  Tobacco Use  . Smoking status: Former Smoker    Packs/day: 1.50    Types: Cigarettes    Last attempt to quit: 08/28/2015    Years since quitting: 2.7  . Smokeless tobacco: Never Used  Substance and Sexual Activity  . Alcohol use: Yes    Alcohol/week: 0.0 standard drinks    Comment: only weekends 3-4 beers entire weekend  . Drug use: No  . Sexual activity: Not on file  Lifestyle  . Physical activity:    Days per week: Not on file    Minutes per session: Not on file  . Stress: Not on file  Relationships  . Social connections:    Talks on phone: Not on file    Gets together: Not on file    Attends religious service: Not on file  Active member of club or organization: Not on file    Attends meetings of clubs or organizations: Not on file    Relationship status: Not on file  . Intimate partner violence:    Fear of current or ex partner: Not on file    Emotionally abused: Not on file    Physically abused: Not on file    Forced sexual activity: Not on file  Other Topics Concern  . Not on file  Social History Narrative  . Not on file    Current Outpatient Medications on File Prior to Visit  Medication Sig Dispense Refill  . aspirin 81 MG tablet Take 81 mg by mouth daily.    Marland Kitchen atorvastatin (LIPITOR) 80 MG tablet Take 1 tablet (80 mg total) by mouth daily at 6 PM. 30 tablet 1  . Blood Glucose  Monitoring Suppl (CONTOUR BLOOD GLUCOSE SYSTEM) DEVI Inject 1 Device as directed 2 (two) times daily. 1 Device 0  . Exenatide ER (BYDUREON) 2 MG PEN Inject into the skin.    . fexofenadine (ALLEGRA) 180 MG tablet Take 180 mg by mouth daily.    Marland Kitchen glucose blood (FREESTYLE LITE) test strip Test once daily before breakfast 50 each 12  . Lancets (FREESTYLE) lancets Use as instructed 100 each 12  . metFORMIN (GLUCOPHAGE) 500 MG tablet Take 1 tablet (500 mg total) by mouth daily with breakfast. (Patient taking differently: Take 500 mg by mouth 2 (two) times daily with a meal. ) 30 tablet 1  . metoprolol tartrate (LOPRESSOR) 25 MG tablet Take 12.5 mg by mouth 2 (two) times daily.    Marland Kitchen omeprazole (PRILOSEC) 40 MG capsule Take 40 mg by mouth daily.     No current facility-administered medications on file prior to visit.    Review of Systems  Constitutional: Negative for malaise/fatigue and weight loss.  Respiratory: Negative for cough, hemoptysis and shortness of breath.   Cardiovascular: Negative for chest pain, palpitations, claudication and leg swelling.  Gastrointestinal: Negative for abdominal pain, blood in stool, constipation, heartburn and vomiting.  Genitourinary: Negative for dysuria.  Musculoskeletal: Negative for joint pain and myalgias.  Neurological: Negative for dizziness, focal weakness and headaches.  Endo/Heme/Allergies: Does not bruise/bleed easily.  Psychiatric/Behavioral: Negative for depression. The patient is not nervous/anxious.   All other systems reviewed and are negative.     Objective:  Blood pressure 130/84, pulse 75, height 5' 6.5" (1.689 m), weight 189 lb (85.7 kg), SpO2 96 %. Body mass index is 30.05 kg/m.  Physical Exam  Constitutional: He appears well-developed and well-nourished. No distress.  HENT:  Head: Atraumatic.  Eyes: Conjunctivae are normal.  Neck: Neck supple. No JVD present. No thyromegaly present.  Cardiovascular: Normal rate, regular rhythm,  normal heart sounds and intact distal pulses. Exam reveals no gallop.  No murmur heard. Pulmonary/Chest: Effort normal and breath sounds normal.  Abdominal: Soft. Bowel sounds are normal.  Musculoskeletal: Normal range of motion.        General: No edema.  Neurological: He is alert.  Skin: Skin is warm and dry.  Psychiatric: He has a normal mood and affect.   CARDIAC STUDIES:   Echocardiogram. 10/17/2015: Normal LV systolic function, EF 67-54%, paradoxical septal motion consistent with CABG, grade 2 diastolic dysfunction. Mild RV and RA dilatation. Moderate pulmonary regurgitation.  Lexiscan Nuclear stress test.  10/03/2015: Mild decrease in LVEF at 45%, no evidence of ischemia, low risk study.  Assessment & Recommendations:   1. Coronary artery disease involving native coronary artery  of native heart without angina pectoris Coronary angiogram 08/27/2016: LM ostial 95% S/P LIMA to LAD and SVG to ramus intermediate on 08/30/2015 by Ivin Poot, MD. EKG 06/02/2018: Normal sinus rhythm at rate of 67 bpm, normal axis, inferior infarct old.  Kidney abnormality, cannot exclude septal ischemia.  Abnormal EKG. Compared to 04/08/17, no significant change.   - EKG 12-Lead  2. S/P CABG x 2  3. Type 2 diabetes mellitus without complication, without long-term current use of insulin ([place])  4. Hypercholesterolemia  Other Labs  Labs 03/27/2017: Total cholesterol 94, triglycerides 67, HDL 34, LDL 46.  A1c 6.4%.  BUN 15, creatinine 0.72, eGFR greater than 60 mL.  CMP otherwise normal.  Recommendation:  Patient is here on a one year follow-up visit for coronary artery disease, except for occasional chest discomfort in the upper part of the chest that comes on with activity that he states that he does not stop doing his activity, states that he feels well.  I prescribed him sublingual nitroglycerin, renewed the medication.  With regard to diabetes mellitus and coronary artery disease, he should be  on an ACE inhibitor, lisinopril was prescribed.  I'll obtain a BMP in 2 weeks.  Side effect of cough was discussed with the patient.  He has remained abstinent from tobacco use.  Diabetes is controlled.  States that his cholesterol recently was slightly elevated and admits to not eating right.  Today to make lifestyle changes.  I would like to repeat lipid profile testing along with CMP in 6 months and see him back at that time.  Adrian Prows, MD, [facility] 06/02/2018, 3:55 PM [facility]. [place] Pager: [phone] Office: [phone] If no answer Cell [phone]

## 2018-10-21 ENCOUNTER — Telehealth: Payer: Self-pay

## 2018-10-21 NOTE — Telephone Encounter (Signed)
He can stop the medication and see if he improves and retry after a week. Doubt the medication is doint this. He should probably consider a tablespoon of Metamucil prior to going to bed daily

## 2018-10-21 NOTE — Telephone Encounter (Signed)
Pt called stating that he started Lisinopril about 2 weeks ago. He has become constipated and having abdominal pain x 3 days ago. Please review and advise.//ah

## 2018-10-22 NOTE — Telephone Encounter (Signed)
Pt aware.//ah

## 2018-10-26 DIAGNOSIS — E1165 Type 2 diabetes mellitus with hyperglycemia: Secondary | ICD-10-CM | POA: Diagnosis not present

## 2018-10-26 DIAGNOSIS — Z951 Presence of aortocoronary bypass graft: Secondary | ICD-10-CM | POA: Diagnosis not present

## 2018-10-26 DIAGNOSIS — E785 Hyperlipidemia, unspecified: Secondary | ICD-10-CM | POA: Diagnosis not present

## 2018-10-26 DIAGNOSIS — Z87891 Personal history of nicotine dependence: Secondary | ICD-10-CM | POA: Diagnosis not present

## 2018-12-01 ENCOUNTER — Encounter: Payer: Self-pay | Admitting: Cardiology

## 2018-12-01 ENCOUNTER — Ambulatory Visit (INDEPENDENT_AMBULATORY_CARE_PROVIDER_SITE_OTHER): Payer: BC Managed Care – PPO | Admitting: Cardiology

## 2018-12-01 ENCOUNTER — Other Ambulatory Visit: Payer: Self-pay

## 2018-12-01 VITALS — BP 140/76 | HR 71 | Temp 98.4°F | Ht 66.5 in | Wt 193.2 lb

## 2018-12-01 DIAGNOSIS — E119 Type 2 diabetes mellitus without complications: Secondary | ICD-10-CM

## 2018-12-01 DIAGNOSIS — I2510 Atherosclerotic heart disease of native coronary artery without angina pectoris: Secondary | ICD-10-CM

## 2018-12-01 DIAGNOSIS — Z951 Presence of aortocoronary bypass graft: Secondary | ICD-10-CM

## 2018-12-01 DIAGNOSIS — E7800 Pure hypercholesterolemia, unspecified: Secondary | ICD-10-CM

## 2018-12-01 DIAGNOSIS — Z794 Long term (current) use of insulin: Secondary | ICD-10-CM

## 2018-12-01 MED ORDER — LOSARTAN POTASSIUM 50 MG PO TABS: 50.0000 mg | ORAL_TABLET | Freq: Every evening | ORAL | 2 refills | Status: DC

## 2018-12-01 NOTE — Progress Notes (Signed)
Primary Physician/Referring:  Vernie Shanks, MD  Patient ID: Justin Olson, male    DOB: [birth year], 53 y.o.   MRN: [id number]  Chief Complaint  Patient presents with  . Coronary Artery Disease  . Follow-up    68mo  HPI:    RREZA Olson is a 53y.o. male  with history of heavy tobacco use disorder, diabetes and hyperlipidemia. family history of premature coronary artery disease with NSTEMI on 08/27/2016 with non-ST elevation myocardial infarction. Due to ostial left main, 95% stenosis underwent emergent CABG with LIMA to LAD and SVG to ramus intermediate on 08/30/2015 by PIvin Poot MD. He has normal LV systolic function. Nuclear stress test in June of 2017 had revealed mild decrease in LVEF at 45% without ischemia, considered low risk.  This is his 6 month OV. He is presently doing well and is remained abstinent from tobacco, he is also reduced drinking iced tea, now the diabetes is very well controlled.  He denies any recurrence of angina pectoris.  Denies claudication symptoms.  Except for mild weight gain, he has no specific complaints today.  He could not tolerate lisinopril that had started 6 months ago, caused him to have severe abdominal cramps.  Hence this was discontinued.  Past Medical History:  Diagnosis Date  . Coronary artery disease   . Diabetes mellitus type 2, noninsulin dependent [place] 08/2015  . Dyslipidemia associated with type 2 diabetes mellitus [place] 08/2015  . Tobacco abuse    Past Surgical History:  Procedure Laterality Date  . CARDIAC CATHETERIZATION N/A 08/28/2015   Procedure: Left Heart Cath and Coronary Angiography;  Surgeon: JLorretta Harp MD;  [facility];  Service: Cardiovascular;  Laterality: N/A;  . CORONARY ARTERY BYPASS GRAFT N/A 08/30/2015   Procedure: CORONARY ARTERY BYPASS GRAFTING (CABG) x2 using left internal mammary artery and right greater saphenous vein. ;  Surgeon: PIvin Poot MD;  LIMA-LAD, SVG-RI  . TEE  WITHOUT CARDIOVERSION N/A 08/30/2015   Procedure: TRANSESOPHAGEAL ECHOCARDIOGRAM (TEE);  Surgeon: PIvin Poot MD;  [place] [facility]  Service: Open Heart Surgery;  Laterality: N/A;   Social History   Socioeconomic History  . Marital status: Married    Spouse name: Not on file  . Number of children: 2  . Years of education: Not on file  . Highest education level: Not on file  Occupational History  . Not on file  Social Needs  . Financial resource strain: Not on file  . Food insecurity    Worry: Not on file    Inability: Not on file  . Transportation needs    Medical: Not on file    Non-medical: Not on file  Tobacco Use  . Smoking status: Former Smoker    Packs/day: 1.50    Types: Cigarettes    Quit date: 08/28/2015    Years since quitting: 3.2  . Smokeless tobacco: Never Used  . Tobacco comment: 30  Substance and Sexual Activity  . Alcohol use: Yes    Alcohol/week: 0.0 standard drinks    Comment: only weekends 3-4 beers entire weekend  . Drug use: No  . Sexual activity: Not on file  Lifestyle  . Physical activity    Days per week: Not on file    Minutes per session: Not on file  . Stress: Not on file  Relationships  . Social [place] phone: Not on file    Gets together: Not  on file    Attends religious service: Not on file    Active member of club or organization: Not on file    Attends meetings of clubs or organizations: Not on file    Relationship status: Not on file  . Intimate partner violence    Fear of current or ex partner: Not on file    Emotionally abused: Not on file    Physically abused: Not on file    Forced sexual activity: Not on file  Other Topics Concern  . Not on file  Social History Narrative  . Not on file   ROS  Review of Systems  Constitution: Positive for weight gain. Negative for chills, decreased appetite and malaise/fatigue.  Cardiovascular: Negative for dyspnea on exertion, leg swelling and syncope.  Endocrine:  Negative for cold intolerance.  Hematologic/Lymphatic: Does not bruise/bleed easily.  Musculoskeletal: Negative for joint swelling.  Gastrointestinal: Negative for abdominal pain, anorexia, change in bowel habit, hematochezia and melena.  Neurological: Negative for headaches and light-headedness.  Psychiatric/Behavioral: Negative for depression and substance abuse.  All other systems reviewed and are negative.  Objective  Blood pressure 140/76, pulse 71, temperature 98.4 F (36.9 C), height 5' 6.5" (1.689 m), weight 193 lb 3.2 oz (87.6 kg), SpO2 97 %. Body mass index is 30.72 kg/m.   Physical Exam  Constitutional: He appears well-developed and well-nourished. No distress.  HENT:  Head: Atraumatic.  Eyes: Conjunctivae are normal.  Neck: Neck supple. No JVD present. No thyromegaly present.  Cardiovascular: Normal rate, regular rhythm, normal heart sounds and intact distal pulses. Exam reveals no gallop.  No murmur heard. Pulmonary/Chest: Effort normal and breath sounds normal.  Sternotomy scar noted  Abdominal: Soft. Bowel sounds are normal.  Musculoskeletal: Normal range of motion.        General: No edema.  Neurological: He is alert.  Skin: Skin is warm and dry.  Psychiatric: He has a normal mood and affect.   Radiology: No results found.  Laboratory examination:   Labs 04/23/2018: A1c 6.3%.  Serum glucose 113 mg, BUN 15, creatinine 0.77, EGFR greater than 61, potassium 4.2, CMP normal.    Total cholesterol 125, triglycerides 122, HDL 34, LDL 67, non-HDL cholesterol 91.  No results for input(s): NA, K, CL, CO2, GLUCOSE, BUN, CREATININE, CALCIUM, GFRNONAA, GFRAA in the last 8760 hours. CMP Latest Ref Rng & Units 09/03/2015 09/02/2015 09/01/2015  Glucose 65 - 99 mg/dL 123(H) 107(H) 96  BUN 6 - 20 mg/dL '11 10 8  ' Creatinine 0.61 - 1.24 mg/dL 0.84 0.84 0.79  Sodium 135 - 145 mmol/L 137 134(L) 135  Potassium 3.5 - 5.1 mmol/L 4.3 4.7 3.6  Chloride 101 - 111 mmol/L 99(L) 104 103   CO2 22 - 32 mmol/L '29 24 24  ' Calcium 8.9 - 10.3 mg/dL 9.4 8.8(L) 8.7(L)  Total Protein 6.5 - 8.1 g/dL - - -  Total Bilirubin 0.3 - 1.2 mg/dL - - -  Alkaline Phos 38 - 126 U/L - - -  AST 15 - 41 U/L - - -  ALT 17 - 63 U/L - - -   CBC Latest Ref Rng & Units 09/03/2015 09/02/2015 09/01/2015  WBC 4.0 - 10.5 K/uL 16.4(H) 16.4(H) 17.9(H)  Hemoglobin 13.0 - 17.0 g/dL 12.0(L) 11.1(L) 11.3(L)  Hematocrit 39.0 - 52.0 % 36.6(L) 34.0(L) 35.2(L)  Platelets 150 - 400 K/uL 341 237 228   Lipid Panel     Component Value Date/Time   CHOL 189 08/29/2015 0210   TRIG 175 (H) 08/29/2015  0210   HDL 30 (L) 08/29/2015 0210   CHOLHDL 6.3 08/29/2015 0210   VLDL 35 08/29/2015 0210   LDLCALC 124 (H) 08/29/2015 0210   HEMOGLOBIN A1C Lab Results  Component Value Date   HGBA1C 7.9 (H) 08/29/2015   MPG 180 08/29/2015   TSH No results for input(s): TSH in the last 8760 hours. Medications   Prior to Admission medications   Medication Sig Start Date End Date Taking? Authorizing Provider  aspirin 81 MG tablet Take 81 mg by mouth daily.    [provider]  atorvastatin (LIPITOR) 80 MG tablet Take 1 tablet (80 mg total) by mouth daily at 6 PM. 09/05/15   Nani Skillern, PA-C  Blood Glucose Monitoring Suppl (CONTOUR BLOOD GLUCOSE SYSTEM) DEVI Inject 1 Device as directed 2 (two) times daily. 09/07/15   Ivin Poot, MD  Exenatide ER (BYDUREON) 2 MG PEN Inject into the skin.    [provider]  fexofenadine (ALLEGRA) 180 MG tablet Take 180 mg by mouth daily.    [provider]  glucose blood (FREESTYLE LITE) test strip Test once daily before breakfast 09/06/15   [place] Gum, Collier Salina, MD  Lancets (FREESTYLE) lancets Use as instructed 09/06/15   [place] Gum, Collier Salina, MD  lisinopril (PRINIVIL,ZESTRIL) 20 MG tablet Take 1 tablet (20 mg total) by mouth daily. 06/02/18 08/31/18  Adrian Prows, MD  metFORMIN (GLUCOPHAGE) 500 MG tablet Take 1 tablet (500 mg total) by mouth daily with breakfast. Patient  taking differently: Take 500 mg by mouth 2 (two) times daily with a meal.  09/05/15   Lars Pinks M, PA-C  metoprolol tartrate (LOPRESSOR) 25 MG tablet Take 12.5 mg by mouth 2 (two) times daily.    [provider]  nitroGLYCERIN (NITROSTAT) 0.4 MG SL tablet Place 1 tablet (0.4 mg total) under the tongue every 5 (five) minutes as needed for up to 25 days for chest pain. 06/02/18 06/27/18  Adrian Prows, MD  omeprazole (PRILOSEC) 40 MG capsule Take 40 mg by mouth daily.    [provider]     Current Outpatient Medications  Medication Instructions  . aspirin 81 mg, Oral, Daily  . atorvastatin (LIPITOR) 80 mg, Oral, Daily-1800  . Blood Glucose Monitoring Suppl (CONTOUR BLOOD GLUCOSE SYSTEM) DEVI 1 Device, Injection, 2 times daily  . fexofenadine (ALLEGRA) 180 mg, Oral, Daily  . glucose blood (FREESTYLE LITE) test strip Test once daily before breakfast  . Lancets (FREESTYLE) lancets Use as instructed  . losartan (COZAAR) 50 mg, Oral, Every evening  . metFORMIN (GLUCOPHAGE) 500 mg, Oral, Daily with breakfast  . metoprolol tartrate (LOPRESSOR) 12.5 mg, Oral, 2 times daily  . nitroGLYCERIN (NITROSTAT) 0.4 mg, Sublingual, Every 5 min PRN  . omeprazole (PRILOSEC) 40 mg, Oral, Daily  . Semaglutide (OZEMPIC, 0.25 OR 0.5 MG/DOSE, [place]) 1 mL, Subcutaneous, Weekly, 0.5 mg SQ weekly     Cardiac Studies:   Lexiscan Nuclear stress test.  10/03/2015: Mild decrease in LVEF at 45%, no evidence of ischemia, low risk study.  Echocardiogram. 10/17/2015: Normal LV systolic function, EF 62-94%, paradoxical septal motion consistent with CABG, grade 2 diastolic dysfunction. Mild RV and RA dilatation. Moderate pulmonary regurgitation.  Coronary angiogram 08/27/2016: LM ostial 95% S/P LIMA to LAD and SVG to ramus intermediate on 08/30/2015 by Ivin Poot, MD.  Assessment     ICD-10-CM   1. Coronary artery disease involving native coronary artery of native heart without angina pectoris  I25.10  EKG [id number]    Basic metabolic  panel    Lipoprotein A (LPA)  2. S/P CABG x 2  Z95.1    LIMA to LAD and SVG to ramus intermediate on 08/30/2015   3. Hypercholesterolemia  E78.00 Lipoprotein A (LPA)  4. Type 2 diabetes mellitus without complication, with long-term current use of insulin ([facility])  E11.9 losartan (COZAAR) 50 MG tablet   Z79.4     EKG 12/01/2018: Normal sinus rhythm at rate of 65 bpm, normal axis, cannot exclude inferior infarct old.  Posterior infarct old.  T abnormality, cannot exclude anteroseptal ischemia. No significant change from  EKG 06/02/2018.  Recommendations:   Patient is here on a 41-monthoffice visit and follow-up of coronary artery disease, he has significant CAD needing CABG, he is presently doing well.  His diabetes is well controlled, previously was uncontrolled.    Except for mild weight gain which patient is aware, no significant change in his physical exam all EKG. His weight gain is probably contributed by smoking cessation.  He did not tolerate lisinopril which had started both for coronary artery disease and for diabetes, he developed severe abdominal cramps.  I'll start him on losartan 50 mg daily in the evening, he will start in the morning until he realizes he will tolerate this.  He needs a BMP in 2 weeks to 3 weeks for follow-up.   I reviewed his labs, lipids are also under excellent control.  He remains asymptomatic without recurrence of angina pectoris and continues to remain active.  No significant change in his EKG, I will see him back on an annual basis.  He has not used sublingual nitroglycerin in the past 6 months.       May be a candidate for the [facility] Trial Will Screen   JAdrian Prows MD, F[facility]8/26/2020, 4:53 PM [facility]. [place]ager: [phone] Office: [phone] no answer Cell [phone]

## 2019-02-15 ENCOUNTER — Other Ambulatory Visit: Payer: Self-pay | Admitting: Cardiology

## 2019-02-15 DIAGNOSIS — E119 Type 2 diabetes mellitus without complications: Secondary | ICD-10-CM

## 2019-06-03 ENCOUNTER — Ambulatory Visit: Payer: BC Managed Care – PPO | Admitting: Cardiology

## 2019-06-15 ENCOUNTER — Ambulatory Visit: Payer: BC Managed Care – PPO | Admitting: Cardiology

## 2019-06-17 ENCOUNTER — Other Ambulatory Visit: Payer: Self-pay | Admitting: Cardiology

## 2019-06-17 DIAGNOSIS — Z794 Long term (current) use of insulin: Secondary | ICD-10-CM

## 2019-06-17 DIAGNOSIS — E119 Type 2 diabetes mellitus without complications: Secondary | ICD-10-CM

## 2019-07-11 DIAGNOSIS — E119 Type 2 diabetes mellitus without complications: Secondary | ICD-10-CM | POA: Diagnosis not present

## 2019-07-11 DIAGNOSIS — I2510 Atherosclerotic heart disease of native coronary artery without angina pectoris: Secondary | ICD-10-CM | POA: Diagnosis not present

## 2019-07-11 DIAGNOSIS — E785 Hyperlipidemia, unspecified: Secondary | ICD-10-CM | POA: Diagnosis not present

## 2019-07-11 DIAGNOSIS — Z951 Presence of aortocoronary bypass graft: Secondary | ICD-10-CM | POA: Diagnosis not present

## 2019-07-29 DIAGNOSIS — M545 Low back pain: Secondary | ICD-10-CM | POA: Diagnosis not present

## 2019-07-29 NOTE — Progress Notes (Signed)
Primary Physician/Referring:  Vernie Shanks, MD  Patient ID: Justin Olson, male    DOB: [birth year], 54 y.o.   MRN: [id number]  Chief Complaint  Patient presents with  . Coronary Artery Disease  . Hyperlipidemia  . Results  . Follow-up    6 month   HPI:    Justin Olson  is a 54 y.o. Caucasian male  with history of heavy tobacco use disorder, diabetes and hyperlipidemia. family history of premature coronary artery disease with NSTEMI on 08/27/2016 with non-ST elevation myocardial infarction. Due to ostial left main, 95% stenosis underwent emergent CABG with LIMA to LAD and SVG to ramus intermediate on 08/30/2015 by Ivin Poot, MD. He has normal LV systolic function. Nuclear stress test in June of 2017 had revealed mild decrease in LVEF at 45% without ischemia, considered low risk.  This is his 6 month OV. He is presently doing well and is remained abstinent from tobacco. He denies any recurrence of angina pectoris.  Denies claudication symptoms.  Past Medical History:  Diagnosis Date  . Coronary artery disease   . Diabetes mellitus type 2, noninsulin dependent ([place]) 08/2015  . Dyslipidemia associated with type 2 diabetes mellitus ([place]) 08/2015  . Tobacco abuse    Past Surgical History:  Procedure Laterality Date  . CARDIAC CATHETERIZATION N/A 08/28/2015   Procedure: Left Heart Cath and Coronary Angiography;  Surgeon: Lorretta Harp, MD;  Location: [facility] [facility];  Service: Cardiovascular;  Laterality: N/A;  . CORONARY ARTERY BYPASS GRAFT N/A 08/30/2015   Procedure: CORONARY ARTERY BYPASS GRAFTING (CABG) x2 using left internal mammary artery and right greater saphenous vein. ;  Surgeon: Ivin Poot, MD;  LIMA-LAD, SVG-RI  . TEE WITHOUT CARDIOVERSION N/A 08/30/2015   Procedure: TRANSESOPHAGEAL ECHOCARDIOGRAM (TEE);  Surgeon: Ivin Poot, MD;  Location: [facility] [place];  Service: Open Heart Surgery;  Laterality: N/A;   Family History  Problem Relation Age of Onset  . Coronary  artery disease Father        MI  . Coronary artery disease Sister        s/p CABG x5  . Heart disease Sister     Social History   Tobacco Use  . Smoking status: Former Smoker    Packs/day: 1.50    Types: Cigarettes    Quit date: 08/28/2015    Years since quitting: 3.9  . Smokeless tobacco: Never Used  . Tobacco comment: 30  Substance Use Topics  . Alcohol use: Yes    Alcohol/week: 0.0 standard drinks    Comment: only weekends 3-4 beers entire weekend   Marital Status: Married  ROS  Review of Systems  Cardiovascular: Negative for chest pain, dyspnea on exertion and leg swelling.  Musculoskeletal: Positive for back pain.  Gastrointestinal: Negative for melena.   Objective  Blood pressure (!) 143/92, pulse 78, temperature 98.8 F (37.1 C), temperature source Temporal, resp. rate 18, height '5\' 6"'$  (1.676 m), weight 183 lb (83 kg), SpO2 96 %.  Vitals with BMI 08/01/2019 12/01/2018 06/02/2018  Height '5\' 6"'$  5' 6.5" 5' 6.5"  Weight 183 lbs 193 lbs 3 oz 189 lbs  BMI 29.55 50.09 38.18  Systolic 299 371 696  Diastolic 92 76 84  Pulse 78 71 75     Physical Exam  Cardiovascular: Normal rate, regular rhythm, normal heart sounds and intact distal pulses. Exam reveals no gallop.  No murmur heard. No leg edema, no JVD.  Pulmonary/Chest: Effort normal and breath sounds normal.  Abdominal: Soft. Bowel sounds are normal.   Laboratory examination:   No results for input(s): NA, K, CL, CO2, GLUCOSE, BUN, CREATININE, CALCIUM, GFRNONAA, GFRAA in the last 8760 hours. CrCl cannot be calculated (Patient's most recent lab result is older than the maximum 21 days allowed.).  CMP Latest Ref Rng & Units 09/03/2015 09/02/2015 09/01/2015  Glucose 65 - 99 mg/dL 123(H) 107(H) 96  BUN 6 - 20 mg/dL '11 10 8  '$ Creatinine 0.61 - 1.24 mg/dL 0.84 0.84 0.79  Sodium 135 - 145 mmol/L 137 134(L) 135  Potassium 3.5 - 5.1 mmol/L 4.3 4.7 3.6  Chloride 101 - 111 mmol/L 99(L) 104 103  CO2 22 - 32 mmol/L '29 24 24    '$ Calcium 8.9 - 10.3 mg/dL 9.4 8.8(L) 8.7(L)  Total Protein 6.5 - 8.1 g/dL - - -  Total Bilirubin 0.3 - 1.2 mg/dL - - -  Alkaline Phos 38 - 126 U/L - - -  AST 15 - 41 U/L - - -  ALT 17 - 63 U/L - - -   CBC Latest Ref Rng & Units 09/03/2015 09/02/2015 09/01/2015  WBC 4.0 - 10.5 K/uL 16.4(H) 16.4(H) 17.9(H)  Hemoglobin 13.0 - 17.0 g/dL 12.0(L) 11.1(L) 11.3(L)  Hematocrit 39.0 - 52.0 % 36.6(L) 34.0(L) 35.2(L)  Platelets 150 - 400 K/uL 341 237 228   Lipid Panel     Component Value Date/Time   CHOL 189 08/29/2015 0210   TRIG 175 (H) 08/29/2015 0210   HDL 30 (L) 08/29/2015 0210   CHOLHDL 6.3 08/29/2015 0210   VLDL 35 08/29/2015 0210   LDLCALC 124 (H) 08/29/2015 0210   HEMOGLOBIN A1C Lab Results  Component Value Date   HGBA1C 7.9 (H) 08/29/2015   MPG 180 08/29/2015   TSH No results for input(s): TSH in the last 8760 hours.  External labs:   04/23/2018: A1c 6.3%.  Serum glucose 113 mg, BUN 15, creatinine 0.77, EGFR greater than 61, potassium 4.2, CMP normal.    Medications and allergies   Allergies  Allergen Reactions  . Lisinopril     Stomach cramping, severe constipation     Current Outpatient Medications  Medication Instructions  . aspirin 81 mg, Oral, Daily  . atorvastatin (LIPITOR) 80 mg, Oral, Daily-1800  . Blood Glucose Monitoring Suppl (CONTOUR BLOOD GLUCOSE SYSTEM) DEVI 1 Device, Injection, 2 times daily  . fexofenadine (ALLEGRA) 180 mg, Oral, Daily  . glucose blood (FREESTYLE LITE) test strip Test once daily before breakfast  . Lancets (FREESTYLE) lancets Use as instructed  . losartan (COZAAR) 100 mg, Oral, Every evening  . metFORMIN (GLUCOPHAGE) 500 mg, Oral, Daily with breakfast  . metoprolol tartrate (LOPRESSOR) 12.5 mg, Oral, 2 times daily  . nitroGLYCERIN (NITROSTAT) 0.4 mg, Sublingual, Every 5 min PRN  . omeprazole (PRILOSEC) 40 mg, Oral, Daily  . Semaglutide (OZEMPIC, 0.25 OR 0.5 MG/DOSE, [place]) 1 mL, Subcutaneous, Weekly, 0.5 mg SQ weekly    Radiology:    No results found.  Cardiac Studies:   [place] Nuclear stress test.  10/03/2015: Mild decrease in LVEF at 45%, no evidence of ischemia, low risk study.  Echocardiogram. 10/17/2015: Normal LV systolic function, EF 84-53%, paradoxical septal motion consistent with CABG, grade 2 diastolic dysfunction. Mild RV and RA dilatation. Moderate pulmonary regurgitation.  Coronary angiogram 08/27/2016: LM ostial 95% S/P LIMA to LAD and SVG to ramus intermediate on 08/30/2015 by Ivin Poot, MD.  EKG :   EKG 08/01/2018: Normal sinus rhythm at the rate of 75 bpm, left atrial enlargement, inferior  infarct old, possible posterior infarct old. IRBBB.  T wave abnormality, cannot exclude septal ischemia. No significant change from 12/01/2018   Assessment     ICD-10-CM   1. Coronary artery disease involving native coronary artery of native heart without angina pectoris  I25.10 EKG 12-Lead  2. S/P CABG x 2  Z95.1    LIMA to LAD and SVG to ramus intermediate on 08/30/2015   3. Hypercholesterolemia  E78.00   4. Type 2 diabetes mellitus without complication, with long-term current use of insulin (HCC)  E11.9 losartan (COZAAR) 100 MG tablet   Z79.4   5. Primary hypertension  I10 losartan (COZAAR) 100 MG tablet     Meds ordered this encounter  Medications  . losartan (COZAAR) 100 MG tablet    Sig: Take 1 tablet (100 mg total) by mouth every evening.    Dispense:  90 tablet    Refill:  3    Medications Discontinued During This Encounter  Medication Reason  . losartan (COZAAR) 50 MG tablet Reorder    Recommendations:   JARRIS KORTZ  is a 54 y.o. Caucasian male  with history of heavy tobacco use disorder, diabetes and hyperlipidemia. family history of premature coronary artery disease with NSTEMI on 08/27/2016 with non-ST elevation myocardial infarction. Due to ostial left main, 95% stenosis underwent emergent CABG with LIMA to LAD and SVG to ramus intermediate on 08/30/2015 by Ivin Poot, MD.    He is presently doing well.  His diabetes is well controlled, previously was uncontrolled.  He is having significant back pain, blood pressure is elevated today, I increased his losartan from 50 mg to 100 mg daily, he has lab appointment in 2 weeks with his PCP which would be very appropriate.  We will follow him closely in 4 weeks also management of hypertension and hyperlipidemia in view of his young age I would like to be very aggressive.  He is remain abstinent from tobacco.  I am very pleased with his progress.  I again discussed with him regarding weight gain especially since he quit smoking.  Adrian Prows, MD, [facility] 08/01/2019, 4:39 PM [facility]. Start Office: [phone]

## 2019-08-01 ENCOUNTER — Ambulatory Visit: Payer: BC Managed Care – PPO | Admitting: Cardiology

## 2019-08-01 ENCOUNTER — Other Ambulatory Visit: Payer: Self-pay

## 2019-08-01 ENCOUNTER — Encounter: Payer: Self-pay | Admitting: Cardiology

## 2019-08-01 VITALS — BP 143/92 | HR 78 | Temp 98.8°F | Resp 18 | Ht 66.0 in | Wt 183.0 lb

## 2019-08-01 DIAGNOSIS — E119 Type 2 diabetes mellitus without complications: Secondary | ICD-10-CM

## 2019-08-01 DIAGNOSIS — I2510 Atherosclerotic heart disease of native coronary artery without angina pectoris: Secondary | ICD-10-CM | POA: Diagnosis not present

## 2019-08-01 DIAGNOSIS — E7800 Pure hypercholesterolemia, unspecified: Secondary | ICD-10-CM | POA: Diagnosis not present

## 2019-08-01 DIAGNOSIS — Z951 Presence of aortocoronary bypass graft: Secondary | ICD-10-CM | POA: Diagnosis not present

## 2019-08-01 DIAGNOSIS — Z794 Long term (current) use of insulin: Secondary | ICD-10-CM

## 2019-08-01 DIAGNOSIS — I10 Essential (primary) hypertension: Secondary | ICD-10-CM

## 2019-08-01 MED ORDER — LOSARTAN POTASSIUM 100 MG PO TABS: 100.0000 mg | ORAL_TABLET | Freq: Every evening | ORAL | 3 refills | Status: DC

## 2019-08-15 DIAGNOSIS — E1165 Type 2 diabetes mellitus with hyperglycemia: Secondary | ICD-10-CM | POA: Diagnosis not present

## 2019-08-15 DIAGNOSIS — Z125 Encounter for screening for malignant neoplasm of prostate: Secondary | ICD-10-CM | POA: Diagnosis not present

## 2019-08-15 DIAGNOSIS — E785 Hyperlipidemia, unspecified: Secondary | ICD-10-CM | POA: Diagnosis not present

## 2019-08-15 DIAGNOSIS — G2581 Restless legs syndrome: Secondary | ICD-10-CM | POA: Diagnosis not present

## 2019-09-01 ENCOUNTER — Encounter: Payer: Self-pay | Admitting: Cardiology

## 2019-09-01 ENCOUNTER — Ambulatory Visit: Payer: BC Managed Care – PPO | Admitting: Cardiology

## 2019-09-01 ENCOUNTER — Other Ambulatory Visit: Payer: Self-pay

## 2019-09-01 VITALS — BP 140/80 | HR 88 | Ht 66.0 in | Wt 177.0 lb

## 2019-09-01 DIAGNOSIS — I10 Essential (primary) hypertension: Secondary | ICD-10-CM | POA: Diagnosis not present

## 2019-09-01 DIAGNOSIS — Z794 Long term (current) use of insulin: Secondary | ICD-10-CM

## 2019-09-01 DIAGNOSIS — I2510 Atherosclerotic heart disease of native coronary artery without angina pectoris: Secondary | ICD-10-CM

## 2019-09-01 DIAGNOSIS — E7800 Pure hypercholesterolemia, unspecified: Secondary | ICD-10-CM | POA: Diagnosis not present

## 2019-09-01 DIAGNOSIS — E119 Type 2 diabetes mellitus without complications: Secondary | ICD-10-CM | POA: Diagnosis not present

## 2019-09-01 NOTE — Progress Notes (Signed)
Primary Physician/Referring:  Vernie Shanks, MD  Patient ID: Justin Olson, male    DOB: 1965/11/22, 54 y.o.   MRN: [id number]  Chief Complaint  Patient presents with  . Hypertension  . Hyperlipidemia  . Coronary Artery Disease   HPI:    Justin Olson  is a 54 y.o. Caucasian male  with history of heavy tobacco use disorder, diabetes and hyperlipidemia. family history of premature coronary artery disease with NSTEMI on 08/27/2016 with non-ST elevation myocardial infarction. Due to ostial left main, 95% stenosis underwent emergent CABG with LIMA to LAD and SVG to ramus intermediate on 08/30/2015 by Ivin Poot, MD. He has normal LV systolic function. Nuclear stress test in June of 2017 had revealed mild decrease in LVEF at 45% without ischemia, considered low risk.  This is his 1 month OV. He is presently doing well and is remained abstinent from tobacco. He denies any recurrence of angina pectoris.  Denies claudication symptoms.  I brought him back to see me again to follow-up on his labs and also hypertension and coronary artery disease since he is high risk.  Past Medical History:  Diagnosis Date  . Coronary artery disease   . Diabetes mellitus type 2, noninsulin dependent (Justin Olson) 08/2015  . Dyslipidemia associated with type 2 diabetes mellitus (Justin Olson) 08/2015  . Tobacco abuse    Past Surgical History:  Procedure Laterality Date  . CARDIAC CATHETERIZATION N/A 08/28/2015   Procedure: Left Heart Cath and Coronary Angiography;  Surgeon: Lorretta Harp, MD;  Location: [place] [facility];  Service: Cardiovascular;  Laterality: N/A;  . CORONARY ARTERY BYPASS GRAFT N/A 08/30/2015   Procedure: CORONARY ARTERY BYPASS GRAFTING (CABG) x2 using left internal mammary artery and right greater saphenous vein. ;  Surgeon: Ivin Poot, MD;  LIMA-LAD, SVG-RI  . TEE WITHOUT CARDIOVERSION N/A 08/30/2015   Procedure: TRANSESOPHAGEAL ECHOCARDIOGRAM (TEE);  Surgeon: Ivin Poot, MD;  Location: [place];  Service: Open Heart Surgery;  Laterality: N/A;   Family History  Problem Relation Age of Onset  . Coronary artery disease Father        MI  . Coronary artery disease Sister        s/p CABG x5  . Heart disease Sister     Social History   Tobacco Use  . Smoking status: Former Smoker    Packs/day: 1.50    Types: Cigarettes    Quit date: 08/28/2015    Years since quitting: 4.0  . Smokeless tobacco: Never Used  . Tobacco comment: 30  Substance Use Topics  . Alcohol use: Yes    Alcohol/week: 0.0 standard drinks    Comment: only weekends 3-4 beers entire weekend   Marital Status: Married  ROS  Review of Systems  Cardiovascular: Negative for chest pain, dyspnea on exertion and leg swelling.  Musculoskeletal: Positive for back pain.  Gastrointestinal: Negative for melena.   Objective  Blood pressure 140/80, pulse 88, height _0  (1.676 m), weight 177 lb (80.3 kg), SpO2 97 %.  Vitals with BMI 09/01/2019 08/01/2019 12/01/2018  Height _1  _2  5' 6.5"  Weight 177 lbs 183 lbs 193 lbs 3 oz  BMI 28.58 74.71 85.50  Systolic 158 682 574  Diastolic 80 92 76  Pulse 88 78 71     Physical Exam  Cardiovascular: Normal rate, regular rhythm, normal heart sounds and intact distal pulses. Exam reveals no gallop.  No murmur heard. No leg edema, no JVD.  Pulmonary/Chest:  Effort normal and breath sounds normal.  Abdominal: Soft. Bowel sounds are normal.   Laboratory examination:   External labs:   Cholesterol, total 122.000 08/15/2019 HDL 34.000 08/15/2019 LDL-C 61.000 08/15/2019 Triglycerides 156.000 08/15/2019  A1C 11.900 08/15/2019  Creatinine, Serum 0.780 08/15/2019 Potassium 4.300 08/15/2019 Magnesium 2.100 MG/ 03/10/2016 ALT (SGPT) 17.000 08/15/2019  04/23/2018: A1c 6.3%.  Serum glucose 113 mg, BUN 15, creatinine 0.77, EGFR greater than 61, potassium 4.2, CMP normal.    Medications and allergies   Allergies  Allergen Reactions  . Lisinopril     Stomach cramping, severe  constipation     Current Outpatient Medications  Medication Instructions  . aspirin 81 mg, Oral, Daily  . atorvastatin (LIPITOR) 80 mg, Oral, Daily-1800  . Blood Glucose Monitoring Suppl (CONTOUR BLOOD GLUCOSE SYSTEM) DEVI 1 Device, Injection, 2 times daily  . fexofenadine (ALLEGRA) 180 mg, Oral, Daily  . glucose blood (FREESTYLE LITE) test strip Test once daily before breakfast  . Lancets (FREESTYLE) lancets Use as instructed  . losartan (COZAAR) 100 mg, Oral, Every evening  . metFORMIN (GLUCOPHAGE) 500 mg, Oral, Daily with breakfast  . metoprolol tartrate (LOPRESSOR) 12.5 mg, Oral, 2 times daily  . nitroGLYCERIN (NITROSTAT) 0.4 mg, Sublingual, Every 5 min PRN  . omeprazole (PRILOSEC) 40 mg, Oral, Daily  . Semaglutide (OZEMPIC, 0.25 OR 0.5 MG/DOSE, [place]) 1 mL, Subcutaneous, Weekly, 0.5 mg SQ weekly    Radiology:   No results found.  Cardiac Studies:   [place] Nuclear stress test 10/03/2015: Mild decrease in LVEF at 45%, no evidence of ischemia, low risk study.  Echocardiogram. 10/17/2015: Normal LV systolic function, EF 46-56%, paradoxical septal motion consistent with CABG, grade 2 diastolic dysfunction. Mild RV and RA dilatation. Moderate pulmonary regurgitation.  Coronary angiogram 08/27/2016: LM ostial 95% S/P LIMA to LAD and SVG to ramus intermediate on 08/30/2015 by Ivin Poot, MD.  EKG  08/01/2018: Normal sinus rhythm at the rate of 75 bpm, left atrial enlargement, inferior infarct old, possible posterior infarct old. IRBBB.  T wave abnormality, cannot exclude septal ischemia. No significant change from 12/01/2018   Assessment     ICD-10-CM   1. Coronary artery disease involving native coronary artery of native heart without angina pectoris  I25.10   2. Hypercholesterolemia  E78.00   3. Type 2 diabetes mellitus without complication, with long-term current use of insulin (HCC)  E11.9    Z79.4   4. Primary hypertension  I10      No orders of the defined types were  placed in this encounter.   There are no discontinued medications.   Recommendations:   Justin Olson  is a 54 y.o. Caucasian male  with history of heavy tobacco use disorder, diabetes and hyperlipidemia. family history of premature coronary artery disease with NSTEMI on 08/27/2016 with non-ST elevation myocardial infarction. Due to ostial left main, 95% stenosis underwent emergent CABG with LIMA to LAD and SVG to ramus intermediate on 08/30/2015 by Ivin Poot, MD.   I had seen him 4 weeks ago, since he is extremely high risk from cardiovascular standpoint, I wanted to follow him up for hypertension management and also follow-up on his labs including lipids.  I obtained external records, lipids are under excellent control however well-controlled diabetes in 2020 is now very much uncontrolled. He is now on Ozempic and has lost 6 pounds in weight just in 4 weeks, he has also made lifestyle changes.  He is noting some mild atrophy, although systolic blood pressure is slightly  elevated, with weight loss and lifestyle modification I suspect this will also improve.  Hence I did not make any changes today.  I like to see him back in 3 months for close monitoring.  He would be a good candidate for Jardiance 25 mg daily if DM is still uncontrolled. Dr. Jacelyn Grip has been doing a fine job in coordinating care and with patient communication.   Adrian Prows, MD, [facility] 09/01/2019, 3:51 PM [facility] [place] Pager: [phone] Office: [phone]

## 2019-09-16 DIAGNOSIS — E1165 Type 2 diabetes mellitus with hyperglycemia: Secondary | ICD-10-CM | POA: Diagnosis not present

## 2019-09-16 DIAGNOSIS — Z951 Presence of aortocoronary bypass graft: Secondary | ICD-10-CM | POA: Diagnosis not present

## 2019-09-16 DIAGNOSIS — E785 Hyperlipidemia, unspecified: Secondary | ICD-10-CM | POA: Diagnosis not present

## 2019-09-16 DIAGNOSIS — I2510 Atherosclerotic heart disease of native coronary artery without angina pectoris: Secondary | ICD-10-CM | POA: Diagnosis not present

## 2019-12-01 ENCOUNTER — Ambulatory Visit: Payer: BC Managed Care – PPO | Admitting: Cardiology

## 2019-12-07 ENCOUNTER — Ambulatory Visit: Payer: BC Managed Care – PPO | Admitting: Cardiology

## 2019-12-07 ENCOUNTER — Other Ambulatory Visit: Payer: Self-pay

## 2019-12-07 ENCOUNTER — Encounter: Payer: Self-pay | Admitting: Cardiology

## 2019-12-07 VITALS — BP 129/77 | HR 62 | Resp 16 | Ht 66.0 in | Wt 178.0 lb

## 2019-12-07 DIAGNOSIS — I10 Essential (primary) hypertension: Secondary | ICD-10-CM | POA: Diagnosis not present

## 2019-12-07 DIAGNOSIS — Z794 Long term (current) use of insulin: Secondary | ICD-10-CM

## 2019-12-07 DIAGNOSIS — I2510 Atherosclerotic heart disease of native coronary artery without angina pectoris: Secondary | ICD-10-CM

## 2019-12-07 DIAGNOSIS — E7800 Pure hypercholesterolemia, unspecified: Secondary | ICD-10-CM

## 2019-12-07 DIAGNOSIS — E119 Type 2 diabetes mellitus without complications: Secondary | ICD-10-CM | POA: Diagnosis not present

## 2019-12-07 NOTE — Progress Notes (Signed)
Primary Physician/Referring:  Vernie Shanks, MD  Patient ID: Justin Olson, male    DOB: [birth year], 54 y.o.   MRN: [id number]  Chief Complaint  Patient presents with  . Coronary Artery Disease  . Hypertension  . Follow-up    3 month   HPI:    Justin Olson  is a 54 y.o. Caucasian male  with history of heavy tobacco use disorder, diabetes and hyperlipidemia. family history of premature coronary artery disease with NSTEMI on 08/27/2016 with non-ST elevation myocardial infarction. Due to ostial left main, 95% stenosis underwent emergent CABG with LIMA to LAD and SVG to ramus intermediate on 08/30/2015 by Ivin Poot, MD. He has normal LV systolic function. Nuclear stress test in June of 2017 had revealed mild decrease in LVEF at 45% without ischemia, considered low risk.  This is his 3 month OV. He is presently doing well and is remained abstinent from tobacco. He denies any recurrence of angina pectoris.  Denies claudication symptoms.  I brought him back to see me again to follow-up on hypertension and coronary artery disease since he is high risk.   Since last office visit he has not had any checking his A1c however he has made lifestyle changes and has lost weight.  No other specific complaints today.  Past Medical History:  Diagnosis Date  . Coronary artery disease   . Diabetes mellitus type 2, noninsulin dependent ([place]) 08/2015  . Dyslipidemia associated with type 2 diabetes mellitus ([place]) 08/2015  . Tobacco abuse    Past Surgical History:  Procedure Laterality Date  . CARDIAC CATHETERIZATION N/A 08/28/2015   Procedure: Left Heart Cath and Coronary Angiography;  Surgeon: Lorretta Harp, MD;  Location: [place] [facility];  Service: Cardiovascular;  Laterality: N/A;  . CORONARY ARTERY BYPASS GRAFT N/A 08/30/2015   Procedure: CORONARY ARTERY BYPASS GRAFTING (CABG) x2 using left internal mammary artery and right greater saphenous vein. ;  Surgeon: Ivin Poot, MD;  LIMA-LAD,  SVG-RI  . TEE WITHOUT CARDIOVERSION N/A 08/30/2015   Procedure: TRANSESOPHAGEAL ECHOCARDIOGRAM (TEE);  Surgeon: Ivin Poot, MD;  Location: [place];  Service: Open Heart Surgery;  Laterality: N/A;   Family History  Problem Relation Age of Onset  . Coronary artery disease Father        MI  . Coronary artery disease Sister        s/p CABG x5  . Heart disease Sister     Social History   Tobacco Use  . Smoking status: Former Smoker    Packs/day: 1.50    Types: Cigarettes    Quit date: 08/28/2015    Years since quitting: 4.2  . Smokeless tobacco: Never Used  . Tobacco comment: 30  Substance Use Topics  . Alcohol use: Yes    Alcohol/week: 0.0 standard drinks    Comment: only weekends 3-4 beers entire weekend   Marital Status: Married  ROS  Review of Systems  Cardiovascular: Negative for chest pain, dyspnea on exertion and leg swelling.  Musculoskeletal: Positive for back pain.  Gastrointestinal: Negative for melena.   Objective  Blood pressure 129/77, pulse 62, resp. rate 16, height '5\' 6"'  (1.676 m), weight 178 lb (80.7 kg), SpO2 97 %.  Vitals with BMI 12/07/2019 09/01/2019 08/01/2019  Height '5\' 6"'  '5\' 6"'  '5\' 6"'   Weight 178 lbs 177 lbs 183 lbs  BMI 28.74 25.00 37.04  Systolic 888 916 945  Diastolic 77 80 92  Pulse 62 88 78  Physical Exam Cardiovascular:     Rate and Rhythm: Normal rate and regular rhythm.     Pulses: Intact distal pulses.     Heart sounds: Normal heart sounds. No murmur heard.  No gallop.      Comments: No leg edema, no JVD. Pulmonary:     Effort: Pulmonary effort is normal.     Breath sounds: Normal breath sounds.  Abdominal:     General: Bowel sounds are normal.     Palpations: Abdomen is soft.    Laboratory examination:   External labs:   Cholesterol, total 122.000 08/15/2019 HDL 34.000 08/15/2019 LDL-C 61.000 08/15/2019 Triglycerides 156.000 08/15/2019  A1C 11.900 08/15/2019  Creatinine, Serum 0.780 08/15/2019 Potassium 4.300  08/15/2019 Magnesium 2.100 MG/ 03/10/2016 ALT (SGPT) 17.000 08/15/2019  04/23/2018: A1c 6.3%.  Serum glucose 113 mg, BUN 15, creatinine 0.77, EGFR greater than 61, potassium 4.2, CMP normal.    Medications and allergies   Allergies  Allergen Reactions  . Lisinopril     Stomach cramping, severe constipation    Outpatient Medications Prior to Visit  Medication Sig Dispense Refill  . aspirin 81 MG tablet Take 81 mg by mouth daily.    Marland Kitchen atorvastatin (LIPITOR) 80 MG tablet Take 1 tablet (80 mg total) by mouth daily at 6 PM. 30 tablet 1  . Blood Glucose Monitoring Suppl (CONTOUR BLOOD GLUCOSE SYSTEM) DEVI Inject 1 Device as directed 2 (two) times daily. 1 Device 0  . fexofenadine (ALLEGRA) 180 MG tablet Take 180 mg by mouth daily.    Marland Kitchen glucose blood (FREESTYLE LITE) test strip Test once daily before breakfast 50 each 12  . Lancets (FREESTYLE) lancets Use as instructed 100 each 12  . losartan (COZAAR) 100 MG tablet Take 1 tablet (100 mg total) by mouth every evening. 90 tablet 3  . metFORMIN (GLUCOPHAGE) 500 MG tablet Take 1 tablet (500 mg total) by mouth daily with breakfast. (Patient taking differently: Take 1,000 mg by mouth 2 (two) times daily with a meal. ) 30 tablet 1  . metoprolol tartrate (LOPRESSOR) 25 MG tablet Take 12.5 mg by mouth 2 (two) times daily.    . nitroGLYCERIN (NITROSTAT) 0.4 MG SL tablet Place 1 tablet (0.4 mg total) under the tongue every 5 (five) minutes as needed for up to 25 days for chest pain. 25 tablet 3  . pantoprazole (PROTONIX) 40 MG tablet Take 40 mg by mouth daily.    . Semaglutide (OZEMPIC, 0.25 OR 0.5 MG/DOSE, [place]) Inject 1 mL into the skin once a week. 0.5 mg SQ weekly    . omeprazole (PRILOSEC) 40 MG capsule Take 40 mg by mouth daily.     No facility-administered medications prior to visit.   Radiology:   No results found.  Cardiac Studies:   [place] Nuclear stress test 10/03/2015: Mild decrease in LVEF at 45%, no evidence of ischemia, low risk  study.  Echocardiogram. 10/17/2015: Normal LV systolic function, EF 44-03%, paradoxical septal motion consistent with CABG, grade 2 diastolic dysfunction. Mild RV and RA dilatation. Moderate pulmonary regurgitation.  Coronary angiogram 08/27/2016: LM ostial 95% S/P LIMA to LAD and SVG to ramus intermediate on 08/30/2015 by Ivin Poot, MD.  EKG  08/01/2018: Normal sinus rhythm at the rate of 75 bpm, left atrial enlargement, inferior infarct old, possible posterior infarct old. IRBBB.  T wave abnormality, cannot exclude septal ischemia. No significant change from 12/01/2018   Assessment     ICD-10-CM   1. Coronary artery disease involving native coronary artery of  native heart without angina pectoris  I25.10   2. Hypercholesterolemia  E78.00   3. Type 2 diabetes mellitus without complication, with long-term current use of insulin (HCC)  E11.9    Z79.4   4. Primary hypertension  I10     No orders of the defined types were placed in this encounter.   Medications Discontinued During This Encounter  Medication Reason  . omeprazole (PRILOSEC) 40 MG capsule Change in therapy     Recommendations:   Justin Olson  is a 54 y.o. Caucasian male  with history of heavy tobacco use disorder, diabetes and hyperlipidemia. family history of premature coronary artery disease with NSTEMI on 08/27/2016 with non-ST elevation myocardial infarction. Due to ostial left main, 95% stenosis underwent emergent CABG with LIMA to LAD and SVG to ramus intermediate on 08/30/2015 by Ivin Poot, MD.   I had seen him 3 months ago, since he is extremely high risk from cardiovascular standpoint, I wanted to follow him up for hypertension management. Due to uncontrolled he was started on  Ozempic by his PCP. He has also made lifestyle changes. He would be a good candidate for Jardiance 25 mg daily if DM is still uncontrolled.   With lifestyle modification, blood pressure is improved, weight loss has helped,  risk  factors are well controlled except for diabetes.  I will see him back in 6 months for follow-up, if he remains stable on annual basis.   Adrian Prows, MD, [facility] 12/07/2019, 9:21 AM Office: [phone]

## 2020-01-24 DIAGNOSIS — E1165 Type 2 diabetes mellitus with hyperglycemia: Secondary | ICD-10-CM | POA: Diagnosis not present

## 2020-01-24 DIAGNOSIS — I2510 Atherosclerotic heart disease of native coronary artery without angina pectoris: Secondary | ICD-10-CM | POA: Diagnosis not present

## 2020-01-24 DIAGNOSIS — Z23 Encounter for immunization: Secondary | ICD-10-CM | POA: Diagnosis not present

## 2020-01-24 DIAGNOSIS — Z951 Presence of aortocoronary bypass graft: Secondary | ICD-10-CM | POA: Diagnosis not present

## 2020-01-24 DIAGNOSIS — E785 Hyperlipidemia, unspecified: Secondary | ICD-10-CM | POA: Diagnosis not present

## 2020-02-03 DIAGNOSIS — I2510 Atherosclerotic heart disease of native coronary artery without angina pectoris: Secondary | ICD-10-CM | POA: Diagnosis not present

## 2020-02-03 DIAGNOSIS — E785 Hyperlipidemia, unspecified: Secondary | ICD-10-CM | POA: Diagnosis not present

## 2020-02-03 DIAGNOSIS — E1165 Type 2 diabetes mellitus with hyperglycemia: Secondary | ICD-10-CM | POA: Diagnosis not present

## 2020-02-03 DIAGNOSIS — Z951 Presence of aortocoronary bypass graft: Secondary | ICD-10-CM | POA: Diagnosis not present

## 2020-05-11 DIAGNOSIS — E785 Hyperlipidemia, unspecified: Secondary | ICD-10-CM | POA: Diagnosis not present

## 2020-05-11 DIAGNOSIS — I2510 Atherosclerotic heart disease of native coronary artery without angina pectoris: Secondary | ICD-10-CM | POA: Diagnosis not present

## 2020-05-11 DIAGNOSIS — Z951 Presence of aortocoronary bypass graft: Secondary | ICD-10-CM | POA: Diagnosis not present

## 2020-05-11 DIAGNOSIS — E1165 Type 2 diabetes mellitus with hyperglycemia: Secondary | ICD-10-CM | POA: Diagnosis not present

## 2020-06-08 ENCOUNTER — Ambulatory Visit: Payer: BC Managed Care – PPO | Admitting: Cardiology

## 2020-07-09 ENCOUNTER — Ambulatory Visit: Payer: BC Managed Care – PPO | Admitting: Cardiology

## 2020-07-10 ENCOUNTER — Ambulatory Visit: Payer: BC Managed Care – PPO | Admitting: Internal Medicine

## 2020-07-10 ENCOUNTER — Encounter: Payer: Self-pay | Admitting: Internal Medicine

## 2020-07-10 ENCOUNTER — Other Ambulatory Visit: Payer: Self-pay

## 2020-07-10 VITALS — BP 130/84 | HR 61 | Ht 66.0 in | Wt 181.4 lb

## 2020-07-10 DIAGNOSIS — E1169 Type 2 diabetes mellitus with other specified complication: Secondary | ICD-10-CM | POA: Diagnosis not present

## 2020-07-10 DIAGNOSIS — Z794 Long term (current) use of insulin: Secondary | ICD-10-CM

## 2020-07-10 LAB — POCT GLUCOSE (DEVICE FOR HOME USE): POC Glucose: 169 mg/dl — AB (ref 70–99)

## 2020-07-10 MED ORDER — ONETOUCH VERIO VI STRP: ORAL_STRIP | 12 refills | Status: AC

## 2020-07-10 NOTE — Patient Instructions (Signed)
-   Continue Metformin 500 mg ,2 tablets in the morning and 2 tablets with Supper  - Continue Ozempic 1 mg weekly    - Check sugar when you first wake up     - Choose healthy, lower carb lower calorie snacks: toss salad, vegetables, cottage cheese, peanut butter, low fat cheese / string cheese, lower sodium deli meat, tuna salad or chicken salad    HOW TO TREAT LOW BLOOD SUGARS (Blood sugar LESS THAN 70 MG/DL)  Please follow the RULE OF 15 for the treatment of hypoglycemia treatment (when your (blood sugars are less than 70 mg/dL)    STEP 1: Take 15 grams of carbohydrates when your blood sugar is low, which includes:   3-4 GLUCOSE TABS  OR  3-4 OZ OF JUICE OR REGULAR SODA OR  ONE TUBE OF GLUCOSE GEL     STEP 2: RECHECK blood sugar in 15 MINUTES STEP 3: If your blood sugar is still low at the 15 minute recheck --> then, go back to STEP 1 and treat AGAIN with another 15 grams of carbohydrates.

## 2020-07-10 NOTE — Progress Notes (Signed)
Name: Justin Olson  MRN/ DOB: [id number], 1966/02/16   Age/ Sex: 55 y.o., male    PCP: Ileana Ladd, MD   Reason for Endocrinology Evaluation: Type 2 Diabetes Mellitus     Date of Initial Endocrinology Visit: 07/10/2020     PATIENT IDENTIFIER: Justin Olson is a 55 y.o. male with a past medical history of T2DM and CAD . The patient presented for initial endocrinology clinic visit on 07/10/2020 for consultative assistance with his diabetes management.    HPI: [name] is accompanied by his wife    Diagnosed with DM in 2017 Prior Medications tried/Intolerance: Bydureon- skin knots  Currently checking blood sugars 0 x / day Hypoglycemia episodes : no Hemoglobin A1c has ranged from 7.9%  in 2017, peaking at 8.8 % in 2022. Patient required assistance for hypoglycemia:  Patient has required hospitalization within the last 1 year from hyper or hypoglycemia: no  In terms of diet, the patient eats 3 meals a day, snacks on cookies and milk at night . Drinks half and half tea. Drinks diet sodas   Denies polydipsia but has frequency  Denies nausea or vomiting  Denies pancreatitis   HOME DIABETES REGIMEN: Metformin 500 mg 2 tabs BID  Ozempic 1 mg weekly ( Sunday)  Statin: yes ACE-I/ARB: yes Prior Diabetic Education: no   METER DOWNLOAD SUMMARY: Does not check     DIABETIC COMPLICATIONS: Microvascular complications:   Denies: CKD, neuropathy, retinopathy Last eye exam: Completed 2020  Macrovascular complications:  CAD ( S/P CABG)  Denies:  PVD, CVA   PAST HISTORY: Past Medical History:  Past Medical History:  Diagnosis Date  . Coronary artery disease   . Diabetes mellitus type 2, noninsulin dependent (HCC) 08/2015  . Dyslipidemia associated with type 2 diabetes mellitus (HCC) 08/2015  . Tobacco abuse    Past Surgical History:  Past Surgical History:  Procedure Laterality Date  . CARDIAC CATHETERIZATION N/A 08/28/2015   Procedure: Left Heart Cath and Coronary  Angiography;  Surgeon: Runell Gess, MD;  Location: [facility] INVASIVE CV LAB;  Service: Cardiovascular;  Laterality: N/A;  . CORONARY ARTERY BYPASS GRAFT N/A 08/30/2015   Procedure: CORONARY ARTERY BYPASS GRAFTING (CABG) x2 using left internal mammary artery and right greater saphenous vein. ;  Surgeon: Kerin Perna, MD;  LIMA-LAD, SVG-RI  . TEE WITHOUT CARDIOVERSION N/A 08/30/2015   Procedure: TRANSESOPHAGEAL ECHOCARDIOGRAM (TEE);  Surgeon: Kerin Perna, MD;  Location: [facility];  Service: Open Heart Surgery;  Laterality: N/A;    Social History:  reports that he quit smoking about 4 years ago. His smoking use included cigarettes. He smoked 1.50 packs per day. He has never used smokeless tobacco. He reports current alcohol use. He reports that he does not use drugs. Family History:  Family History  Problem Relation Age of Onset  . Coronary artery disease Father        MI  . Coronary artery disease Sister        s/p CABG x5  . Heart disease Sister      HOME MEDICATIONS: Allergies as of 07/10/2020      Reactions   Lisinopril    Stomach cramping, severe constipation      Medication List       Accurate as of July 10, 2020  3:40 PM. If you have any questions, ask your nurse or doctor.        aspirin 81 MG tablet Take 81 mg by mouth daily.   atorvastatin  80 MG tablet Commonly known as: LIPITOR Take 1 tablet (80 mg total) by mouth daily at 6 PM.   CONTOUR BLOOD GLUCOSE SYSTEM Devi Inject 1 Device as directed 2 (two) times daily.   fexofenadine 180 MG tablet Commonly known as: ALLEGRA Take 180 mg by mouth daily.   freestyle lancets Use as instructed   glucose blood test strip Commonly known as: FREESTYLE LITE Test once daily before breakfast   losartan 100 MG tablet Commonly known as: COZAAR Take 1 tablet (100 mg total) by mouth every evening.   metFORMIN 500 MG tablet Commonly known as: GLUCOPHAGE Take 1 tablet (500 mg total) by mouth daily with breakfast. What  changed:  how much to take when to take this   metoprolol tartrate 25 MG tablet Commonly known as: LOPRESSOR Take 12.5 mg by mouth 2 (two) times daily.   nitroGLYCERIN 0.4 MG SL tablet Commonly known as: NITROSTAT Place 1 tablet (0.4 mg total) under the tongue every 5 (five) minutes as needed for up to 25 days for chest pain.   OZEMPIC (0.25 OR 0.5 MG/DOSE) [place] Inject 1 mL into the skin once a week. 0.5 mg SQ weekly   pantoprazole 40 MG tablet Commonly known as: PROTONIX Take 40 mg by mouth daily.         ALLERGIES: Allergies  Allergen Reactions  . Lisinopril     Stomach cramping, severe constipation     REVIEW OF SYSTEMS: A comprehensive ROS was conducted with the patient and is negative except as per HPI and    OBJECTIVE:   VITAL SIGNS: BP 130/84   Pulse 61   Ht 5\' 6"  (1.676 m)   Wt 181 lb 6 oz (82.3 kg)   SpO2 98%   BMI 29.27 kg/m    PHYSICAL EXAM:  General: Pt appears well and is in NAD  Neck: General: Supple without adenopathy or carotid bruits. Thyroid: Thyroid size normal.  No goiter or nodules appreciated. No thyroid bruit.  Lungs: Clear with good BS bilat  Heart: RRR   Abdomen: Normoactive bowel sounds, soft, nontender, without masses or organomegaly palpable  Extremities:  Lower extremities - No pretibial edema. No lesions.  Skin: Normal texture and temperature to palpation.   Neuro: MS is good with appropriate affect, pt is alert and Ox3    DM foot exam: 07/10/2020  The skin of the feet is intact without sores or ulcerations. The pedal pulses are 1+ bilaterally  The sensation is intact to a screening 5.07, 10 gram monofilament bilaterally   DATA REVIEWED:  Lab Results  Component Value Date   HGBA1C 7.9 (H) 08/29/2015   HGBA1C 7.9 (H) 08/28/2015   08/15/2019 HDL 34 LDL 61 Tg 156 BUN/Cr 9/0.780 MA/Cr ratio 8.9   ASSESSMENT / PLAN / RECOMMENDATIONS:   1) Type 2 Diabetes Mellitus, Poorly controlled, With Macrovascular complications -  Most recent A1c of 8.8 %. Goal A1c < 7.0 %.    Plan: GENERAL: I have discussed with the patient the pathophysiology of diabetes. We went over the natural progression of the disease. We talked about both insulin resistance and insulin deficiency. We stressed the importance of lifestyle changes including diet and exercise. I explained the complications associated with diabetes including retinopathy, nephropathy, neuropathy as well as increased risk of cardiovascular disease. We went over the benefit seen with glycemic control.   I explained to the patient that diabetic patients are at higher than normal risk for amputations.  We discussed low carb options as  well as avoiding sugar-sweetened beverages and snacks I have encouraged him to check glucose at home, he was provided and trained on glucose meter  His Ozempic dose was recently increase  No changes today but will recheck in 8 weeks  We discussed SGLT-2 inhibitors as a good option for add-on therapy if needed for the future.   MEDICATIONS: - Continue Metformin 500 mg ,2 tablets in the morning and 2 tablets with Supper  - Continue Ozempic 1 mg weekly   EDUCATION / INSTRUCTIONS: BG monitoring instructions: Patient is instructed to check his blood sugars 1 times a day, daily . Call [place] [facility] if: BG persistently < 70  I reviewed the Rule of 15 for the treatment of hypoglycemia in detail with the patient. Literature supplied.   2) Diabetic complications:  Eye: Does not have known diabetic retinopathy.  Neuro/ Feet: Does not have known diabetic peripheral neuropathy. Renal: Patient does not have known baseline CKD. He is on an ACEI/ARB at present.Check urine albumin/creatinine ratio yearly starting at time of diagnosis. If albuminuria is positive, treatment is geared toward better glucose, blood pressure control and use of ACE inhibitors or ARBs. Monitor electrolytes and creatinine once to twice yearly.   3) Dyslipidemia:  Patient is on Atorvastatin 80 mg daily  - LDL at goal     F/U in 8 weeks     Signed electronically by: Lyndle Herrlich, MD  [facility] Endocrinology  [facility] [address] [place], [place] [address] Phone: [phone] FAX: [phone]   CC: Ileana Ladd, MD [address] [place] [address] Phone: [phone]  Fax: [phone]    Return to Endocrinology clinic as below: Future Appointments  Date Time Provider Department Center  08/10/2020  1:15 PM Yates Decamp, MD [facility] None

## 2020-08-10 ENCOUNTER — Other Ambulatory Visit: Payer: Self-pay

## 2020-08-10 ENCOUNTER — Encounter: Payer: Self-pay | Admitting: Cardiology

## 2020-08-10 ENCOUNTER — Ambulatory Visit: Payer: BC Managed Care – PPO | Admitting: Cardiology

## 2020-08-10 VITALS — BP 123/79 | HR 67 | Temp 98.1°F | Resp 16 | Ht 66.0 in | Wt 181.0 lb

## 2020-08-10 DIAGNOSIS — E1165 Type 2 diabetes mellitus with hyperglycemia: Secondary | ICD-10-CM

## 2020-08-10 DIAGNOSIS — E7800 Pure hypercholesterolemia, unspecified: Secondary | ICD-10-CM | POA: Diagnosis not present

## 2020-08-10 DIAGNOSIS — Z951 Presence of aortocoronary bypass graft: Secondary | ICD-10-CM | POA: Diagnosis not present

## 2020-08-10 DIAGNOSIS — E785 Hyperlipidemia, unspecified: Secondary | ICD-10-CM | POA: Diagnosis not present

## 2020-08-10 DIAGNOSIS — I10 Essential (primary) hypertension: Secondary | ICD-10-CM | POA: Diagnosis not present

## 2020-08-10 DIAGNOSIS — Z125 Encounter for screening for malignant neoplasm of prostate: Secondary | ICD-10-CM | POA: Diagnosis not present

## 2020-08-10 DIAGNOSIS — I2510 Atherosclerotic heart disease of native coronary artery without angina pectoris: Secondary | ICD-10-CM

## 2020-08-10 NOTE — Progress Notes (Signed)
Primary Physician/Referring:  Vernie Shanks, MD  Patient ID: Justin Olson, male    DOB: 07/05/1965, 55 y.o.   MRN: [id number]  Chief Complaint  Patient presents with  . Coronary Artery Disease  . Hypertension  . Hyperlipidemia  . Follow-up    6 month   HPI:    Justin Olson  is a 55 y.o. Caucasian male  with history of heavy tobacco use disorder quit in 2017, diabetes and hyperlipidemia. family history of premature coronary artery disease with NSTEMI on 08/27/2016 with non-ST elevation myocardial infarction. Due to ostial left main, 95% stenosis underwent emergent CABG with LIMA to LAD and SVG to ramus intermediate on 08/30/2015 by Ivin Poot, MD. He has normal LV systolic function. Nuclear stress test in June of 2017 had revealed mild decrease in LVEF at 45% without ischemia, considered low risk.  This is his 6 month OV. He is presently doing well and is remained abstinent from tobacco. He denies any recurrence of angina pectoris or dyspnea on exertion or symptoms of claudication.  He has been compliant with his medications. His diabetes continues to be uncontrolled.  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  Past Medical History:  Diagnosis Date  . Coronary artery disease   . Diabetes mellitus type 2, noninsulin dependent ([place]) 08/2015  . Dyslipidemia associated with type 2 diabetes mellitus ([place]) 08/2015  . Tobacco abuse    Past Surgical History:  Procedure Laterality Date  . CARDIAC CATHETERIZATION N/A 08/28/2015   Procedure: Left Heart Cath and Coronary Angiography;  Surgeon: Lorretta Harp, MD;  Location: [place] CV LAB;  Service: Cardiovascular;  Laterality: N/A;  . CORONARY ARTERY BYPASS GRAFT N/A 08/30/2015   Procedure: CORONARY ARTERY BYPASS GRAFTING (CABG) x2 using left internal mammary artery and right greater saphenous vein. ;  Surgeon: Ivin Poot, MD;  LIMA-LAD, SVG-RI  . TEE WITHOUT CARDIOVERSION N/A 08/30/2015   Procedure: TRANSESOPHAGEAL  ECHOCARDIOGRAM (TEE);  Surgeon: Ivin Poot, MD;  Location: [place];  Service: Open Heart Surgery;  Laterality: N/A;   Family History  Problem Relation Age of Onset  . Coronary artery disease Father        MI  . Coronary artery disease Sister        s/p CABG x5  . Drug abuse Brother   . Heart disease Sister     Social History   Tobacco Use  . Smoking status: Former Smoker    Packs/day: 1.50    Types: Cigarettes    Quit date: 08/28/2015    Years since quitting: 4.9  . Smokeless tobacco: Never Used  . Tobacco comment: 30  Substance Use Topics  . Alcohol use: Not Currently    Alcohol/week: 0.0 standard drinks    Comment: only weekends 3-4 beers entire weekend   Marital Status: Married  ROS  Review of Systems  Cardiovascular: Negative for chest pain, dyspnea on exertion and leg swelling.  Musculoskeletal: Positive for back pain.  Gastrointestinal: Negative for melena.   Objective  Blood pressure 123/79, pulse 67, temperature 98.1 F (36.7 C), temperature source Temporal, resp. rate 16, height '5\' 6"'  (1.676 m), weight 181 lb (82.1 kg), SpO2 97 %.  Vitals with BMI 08/10/2020 07/10/2020 12/07/2019  Height '5\' 6"'  '5\' 6"'  '5\' 6"'   Weight 181 lbs 181 lbs 6 oz 178 lbs  BMI 29.23 09.73 53.29  Systolic 924 268 341  Diastolic 79 84 77  Pulse 67 61 62  Physical Exam Cardiovascular:     Rate and Rhythm: Normal rate and regular rhythm.     Pulses: Intact distal pulses.     Heart sounds: Normal heart sounds. No murmur heard. No gallop.      Comments: No leg edema, no JVD. Pulmonary:     Effort: Pulmonary effort is normal.     Breath sounds: Normal breath sounds.  Abdominal:     General: Bowel sounds are normal.     Palpations: Abdomen is soft.     Hernia: A hernia is present. Hernia is present in the ventral area.    Laboratory examination:   External labs:   Labs 05/11/2020:  A1c 8.8%.  Serum glucose 117 mg, BUN 9, creatinine 0.78, potassium 4.5, EGFR >60 mL.  CMP otherwise  normal.  Cholesterol, total 122.000 08/15/2019 HDL 34.000 08/15/2019 LDL-C 61.000 08/15/2019 Triglycerides 156.000 08/15/2019  A1C 11.900 08/15/2019  Creatinine, Serum 0.780 08/15/2019 Potassium 4.300 08/15/2019 Magnesium 2.100 MG/ 03/10/2016 ALT (SGPT) 17.000 08/15/2019  04/23/2018: A1c 6.3%.  Serum glucose 113 mg, BUN 15, creatinine 0.77, EGFR greater than 61, potassium 4.2, CMP normal.    Medications and allergies   Allergies  Allergen Reactions  . Lisinopril     Stomach cramping, severe constipation    Outpatient Medications Prior to Visit  Medication Sig Dispense Refill  . aspirin 81 MG tablet Take 81 mg by mouth daily.    Marland Kitchen atorvastatin (LIPITOR) 80 MG tablet Take 1 tablet (80 mg total) by mouth daily at 6 PM. 30 tablet 1  . Blood Glucose Monitoring Suppl (CONTOUR BLOOD GLUCOSE SYSTEM) [place] Inject 1 Device as directed 2 (two) times daily. 1 Device 0  . fexofenadine (ALLEGRA) 180 MG tablet Take 180 mg by mouth daily.    Mar[place] Kitchen glucose blood (ONETOUCH VERIO) test strip Use as instructed 100 each 12  . Lancets (FREESTYLE) lancets Use as instructed 100 each 12  . losartan (COZAAR) 100 MG tablet Take 1 tablet (100 mg total) by mouth every evening. 90 tablet 3  . metFORMIN (GLUCOPHAGE) 500 MG tablet Take 1 tablet (500 mg total) by mouth daily with breakfast. (Patient taking differently: Take 1,000 mg by mouth 2 (two) times daily with a meal. 2 tablets in the morning and 2 tablets in the evening) 30 tablet 1  . metoprolol tartrate (LOPRESSOR) 25 MG tablet Take 12.5 mg by mouth 2 (two) times daily.    . nitroGLYCERIN (NITROSTAT) 0.4 MG SL tablet Place 1 tablet (0.4 mg total) under the tongue every 5 (five) minutes as needed for up to 25 days for chest pain. 25 tablet 3  . pantoprazole (PROTONIX) 40 MG tablet Take 40 mg by mouth daily.    . Semaglutide (OZEMPIC, 0.25 OR 0.5 MG/DOSE, [place]) Inject 1 mL into the skin once a week. 0.5 mg SQ weekly     No facility-administered medications prior to  visit.   Radiology:   No results found.  Cardiac Studies:   [place] Nuclear stress test 10/03/2015: Mild decrease in LVEF at 45%, no evidence of ischemia, low risk study.  Echocardiogram. 10/17/2015: Normal LV systolic function, EF 57-84%, paradoxical septal motion consistent with CABG, grade 2 diastolic dysfunction. Mild RV and RA dilatation. Moderate pulmonary regurgitation.  Coronary angiogram 08/27/2016: LM ostial 95% S/P LIMA to LAD and SVG to ramus intermediate on 08/30/2015 by Ivin Poot, MD.  EKG  EKG 08/10/2020: Normal sinus rhythm at rate of 64 bpm, normal axis, inferior infarct old, posterior infarct old.  No evidence  of ischemia.  No significant change from 08/01/2018   Assessment     ICD-10-CM   1. Coronary artery disease involving native coronary artery of native heart without angina pectoris  I25.10 EKG 12-Lead    PCV ECHOCARDIOGRAM COMPLETE    PCV MYOCARDIAL PERFUSION WO LEXISCAN  2. Hypercholesterolemia  E78.00   3. Primary hypertension  I10   4. S/P CABG x 2  Z95.1 PCV ECHOCARDIOGRAM COMPLETE    PCV MYOCARDIAL PERFUSION WO LEXISCAN  5. Uncontrolled type 2 diabetes mellitus with hyperglycemia (HCC)  E11.65     No orders of the defined types were placed in this encounter.   There are no discontinued medications.   Recommendations:   LIMUEL NIEBLAS  is a 55 y.o. Caucasian male  with history of heavy tobacco use disorder quit in 2017, diabetes and hyperlipidemia. family history of premature coronary artery disease with NSTEMI on 08/27/2016 with non-ST elevation myocardial infarction. Due to ostial left main, 95% stenosis underwent emergent CABG with LIMA to LAD and SVG to ramus intermediate on 08/30/2015 by Ivin Poot, MD. He has normal LV systolic function. Nuclear stress test in June of 2017 had revealed mild decrease in LVEF at 45% without ischemia, considered low risk.  This is his 6 month OV. Due to uncontrolled DM he was started on  Ozempic by [facility]  (established with her for DM management). He has also made lifestyle changes. He would be a good candidate for Jardiance 25 mg daily if DM is still uncontrolled. He needs lipid profile testing (seeing Dr. Jacelyn Grip soon) as it has been a year since last check. Risk factors are well controlled except for diabetes.   It has been greater than 5 years since he has had his cardiac evaluation performed, in view of ongoing uncontrolled diabetes, progression of coronary disease therapy excluded, will repeat echocardiogram to reevaluate his LVEF and also exercise nuclear stress test to exclude progression of CAD.  I will see him back in 4-6 weeks for follow-up, if he remains stable on annual basis.   Adrian Prows, MD, [facility] 08/11/2020, 8:37 AM Office: [phone]

## 2020-08-15 ENCOUNTER — Ambulatory Visit: Payer: BC Managed Care – PPO

## 2020-08-15 ENCOUNTER — Other Ambulatory Visit: Payer: Self-pay

## 2020-08-15 DIAGNOSIS — Z951 Presence of aortocoronary bypass graft: Secondary | ICD-10-CM

## 2020-08-15 DIAGNOSIS — I2510 Atherosclerotic heart disease of native coronary artery without angina pectoris: Secondary | ICD-10-CM

## 2020-08-27 ENCOUNTER — Ambulatory Visit: Payer: BC Managed Care – PPO

## 2020-08-27 ENCOUNTER — Other Ambulatory Visit: Payer: Self-pay

## 2020-08-27 DIAGNOSIS — I2510 Atherosclerotic heart disease of native coronary artery without angina pectoris: Secondary | ICD-10-CM

## 2020-08-27 DIAGNOSIS — Z951 Presence of aortocoronary bypass graft: Secondary | ICD-10-CM | POA: Diagnosis not present

## 2020-09-04 ENCOUNTER — Ambulatory Visit: Payer: BC Managed Care – PPO | Admitting: Internal Medicine

## 2020-09-04 NOTE — Progress Notes (Deleted)
Name: Justin Olson  Age/ Sex: 55 y.o., male   MRN/ DOB: [id number], 1965/08/21     PCP: Ileana Ladd, MD   Reason for Endocrinology Evaluation: Type 2 Diabetes Mellitus  Initial Endocrine Consultative Visit: 07/10/2020    PATIENT IDENTIFIER: [name] is a 55 y.o. male with a past medical history of T2DM and CAD. The patient has followed with Endocrinology clinic since 07/10/2020 for consultative assistance with management of his diabetes.  DIABETIC HISTORY:  Mr. Flythe was diagnosed with DM in 2017, Bydureon- localized skin knots . His hemoglobin A1c has ranged from 7.9%  in 2017, peaking at 8.8 % in 2022.  On his initial visit to our clinic, his A1c was 8.8% We continued Metformin and Ozempic as his dose was recently increased.   SUBJECTIVE:   During the last visit (07/10/2020): A1c 8.8%   Today (09/04/2020): Mr. Rendleman is here for a follow up on diabetes management.  He checks his blood sugars *** times daily, preprandial to breakfast and ***. The patient has *** had hypoglycemic episodes since the last clinic visit, which typically occur *** x / - most often occuring ***. The patient is *** symptomatic with these episodes, with symptoms of {symptoms; hypoglycemia:9084048}.      HOME DIABETES REGIMEN:  Metformin 500 mg ,2 tabs BID  Ozempic 1 mg weekly   Statin: yes ACE-I/ARB: yes   METER DOWNLOAD SUMMARY: Date range evaluated: *** Fingerstick Blood Glucose Tests = *** Average Number Tests/Day = *** Overall Mean FS Glucose = *** Standard Deviation = ***  BG Ranges: Low = *** High = ***   Hypoglycemic Events/30 Days: BG < 50 = *** Episodes of symptomatic severe hypoglycemia = ***    DIABETIC COMPLICATIONS: Microvascular complications:   ***  Denies:   Last Eye Exam: Completed   Macrovascular complications:   CAD ( S/P CABG)   Denies: CVA, PVD   HISTORY:  Past Medical History:  Past Medical History:  Diagnosis Date  . Coronary artery disease   .  Diabetes mellitus type 2, noninsulin dependent (HCC) 08/2015  . Dyslipidemia associated with type 2 diabetes mellitus (HCC) 08/2015  . Tobacco abuse     Past Surgical History:  Past Surgical History:  Procedure Laterality Date  . CARDIAC CATHETERIZATION N/A 08/28/2015   Procedure: Left Heart Cath and Coronary Angiography;  Surgeon: Runell Gess, MD;  Location: [facility] INVASIVE CV LAB;  Service: Cardiovascular;  Laterality: N/A;  . CORONARY ARTERY BYPASS GRAFT N/A 08/30/2015   Procedure: CORONARY ARTERY BYPASS GRAFTING (CABG) x2 using left internal mammary artery and right greater saphenous vein. ;  Surgeon: Kerin Perna, MD;  LIMA-LAD, SVG-RI  . TEE WITHOUT CARDIOVERSION N/A 08/30/2015   Procedure: TRANSESOPHAGEAL ECHOCARDIOGRAM (TEE);  Surgeon: Kerin Perna, MD;  Location: [facility] OR;  Service: Open Heart Surgery;  Laterality: N/A;     Social History:  reports that he quit smoking about 5 years ago. His smoking use included cigarettes. He smoked 1.50 packs per day. He has never used smokeless tobacco. He reports previous alcohol use. He reports that he does not use drugs. Family History:  Family History  Problem Relation Age of Onset  . Coronary artery disease Father        MI  . Coronary artery disease Sister        s/p CABG x5  . Drug abuse Brother   . Heart disease Sister       HOME MEDICATIONS: Allergies as of  09/04/2020      Reactions   Lisinopril    Stomach cramping, severe constipation      Medication List       Accurate as of Sep 04, 2020 12:53 PM. If you have any questions, ask your nurse or doctor.        aspirin 81 MG tablet Take 81 mg by mouth daily.   atorvastatin 80 MG tablet Commonly known as: LIPITOR Take 1 tablet (80 mg total) by mouth daily at 6 PM.   CONTOUR BLOOD GLUCOSE SYSTEM Devi Inject 1 Device as directed 2 (two) times daily.   fexofenadine 180 MG tablet Commonly known as: ALLEGRA Take 180 mg by mouth daily.   freestyle lancets Use as  instructed   losartan 100 MG tablet Commonly known as: COZAAR Take 1 tablet (100 mg total) by mouth every evening.   metFORMIN 500 MG tablet Commonly known as: GLUCOPHAGE Take 1 tablet (500 mg total) by mouth daily with breakfast. What changed:   how much to take  when to take this  additional instructions   metoprolol tartrate 25 MG tablet Commonly known as: LOPRESSOR Take 12.5 mg by mouth 2 (two) times daily.   nitroGLYCERIN 0.4 MG SL tablet Commonly known as: NITROSTAT Place 1 tablet (0.4 mg total) under the tongue every 5 (five) minutes as needed for up to 25 days for chest pain.   OneTouch Verio test strip Generic drug: glucose blood Use as instructed   OZEMPIC (0.25 OR 0.5 MG/DOSE) [place] Inject 1 mL into the skin once a week. 0.5 mg SQ weekly   pantoprazole 40 MG tablet Commonly known as: PROTONIX Take 40 mg by mouth daily.        OBJECTIVE:   Vital Signs: There were no vitals taken for this visit.  Wt Readings from Last 3 Encounters:  08/10/20 181 lb (82.1 kg)  07/10/20 181 lb 6 oz (82.3 kg)  12/07/19 178 lb (80.7 kg)     Exam: General: Pt appears well and is in NAD  Hydration: Well-hydrated with moist mucous membranes and good skin turgor  HEENT: Head: Unremarkable with good dentition. Oropharynx clear without exudate.  Eyes: External eye exam normal without stare, lid lag or exophthalmos.  EOM intact.  PERRL.  Neck: General: Supple without adenopathy. Thyroid: Thyroid size normal.  No goiter or nodules appreciated.   Lungs: Clear with good BS bilat with no rales, rhonchi, or wheezes  Heart: RRR   Abdomen: Normoactive bowel sounds, soft, nontender, without masses or organomegaly palpable  Extremities: No pretibial edema.   Neuro: MS is good with appropriate affect, pt is alert and Ox3     DM foot exam: 07/10/2020  The skin of the feet is intact without sores or ulcerations. The pedal pulses are 1+ bilaterally  The sensation is intact to a  screening 5.07, 10 gram monofilament bilaterally    DATA REVIEWED:  Lab Results  Component Value Date   HGBA1C 7.9 (H) 08/29/2015   HGBA1C 7.9 (H) 08/28/2015   Lab Results  Component Value Date   LDLCALC 124 (H) 08/29/2015   CREATININE 0.84 09/03/2015   No results found for: [facility]   Lab Results  Component Value Date   CHOL 189 08/29/2015   HDL 30 (L) 08/29/2015   LDLCALC 124 (H) 08/29/2015   TRIG 175 (H) 08/29/2015   CHOLHDL 6.3 08/29/2015         ASSESSMENT / PLAN / RECOMMENDATIONS:   1) Type 2 Diabetes Mellitus, ***controlled, With  Macrovascular  complications - Most recent A1c of *** %. Goal A1c < 7.0 %.   Plan: MEDICATIONS:  ***  EDUCATION / INSTRUCTIONS:  BG monitoring instructions: Patient is instructed to check his blood sugars *** times a day, ***.  Call [facility] if: BG persistently < 70 or > 300. . I reviewed the Rule of 15 for the treatment of hypoglycemia in detail with the patient. Literature supplied.    2) Diabetic complications:   Eye: Does *** have known diabetic retinopathy.   Neuro/ Feet: Does *** have known diabetic peripheral neuropathy .   Renal: Patient does *** have known baseline CKD. He   is *** on an ACEI/ARB at present. Check urine albumin/creatinine ratio yearly starting at time of diagnosis. If albuminuria is positive, treatment is geared toward better glucose, blood pressure control and use of ACE inhibitors or ARBs. Monitor electrolytes and creatinine once to twice yearly.   3) Lipids: Patient is *** on a statin.  4) Hypertension: *** at goal of < 140/90 mmHg.    F/U in ***    Signed electronically by: Lyndle Herrlich, MD  [facility]  [facility] [address] [place] [place], [place] [address] Phone: [phone] FAX: [phone]   CC: Ileana Ladd, MD [address] [place] [address] Phone: [phone]  Fax: [phone]  Return to  Endocrinology clinic as below: Future Appointments  Date Time Provider Department Center  09/04/2020  3:40 PM Wildon Cuevas, Konrad Dolores, MD [facility]  10/01/2020  4:00 PM Carolan Shiver, RD [facility] N[facility]  02/11/2021  3:30 PM Yates Decamp, MD [facility] None

## 2020-10-01 ENCOUNTER — Encounter: Payer: Self-pay | Admitting: Registered"

## 2020-10-01 ENCOUNTER — Other Ambulatory Visit: Payer: Self-pay

## 2020-10-01 ENCOUNTER — Encounter: Payer: BC Managed Care – PPO | Attending: Family Medicine | Admitting: Registered"

## 2020-10-01 DIAGNOSIS — E1165 Type 2 diabetes mellitus with hyperglycemia: Secondary | ICD-10-CM | POA: Insufficient documentation

## 2020-10-01 DIAGNOSIS — IMO0002 Reserved for concepts with insufficient information to code with codable children: Secondary | ICD-10-CM

## 2020-10-01 DIAGNOSIS — E118 Type 2 diabetes mellitus with unspecified complications: Secondary | ICD-10-CM | POA: Diagnosis not present

## 2020-10-01 NOTE — Progress Notes (Signed)
Diabetes Self-Management Education  Visit Type: First/Initial  Appt. Start Time: 1610 Appt. End Time: 4098  10/01/2020  Justin Olson, identified by name and date of birth, is a 55 y.o. male with a diagnosis of Diabetes: Type 2.   ASSESSMENT  There were no vitals taken for this visit. There is no height or weight on file to calculate BMI.  A1c 7.9%; Ozempic 2 mg; metformin 1000 bid s/p CABG 5 yrs ago  Pt states he had a stress test recently and was told that he has a very strong heart. Pt states his cardiologist said that he should not lift more than 40 lbs due to having open heart surgery.  Pt reports he is very motivated to help decrease his blood sugar to reduce his risk of complications such as amputation.   Pt reports changes he has made include eating subway sandwiches instead of hamburgers and hotdogs, switching breakfast from sausage egg, cheese biscuits to plain grits, also eats ess bread, less potatoes. Pt states he loves chocolate chip cookies and 2% milk while watching TV and used to indulge every evening but has cut back to 1/week.   Pt reports he loves foods such as vegetables and wheat bread (instead of white). 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  Sleep: 8 hrs Stress: 7/10 d/t customer complaints at body work shop   Diabetes Self-Management Education - 10/01/20 1605       Visit Information   Visit Type First/Initial      Initial Visit   Diabetes Type Type 2    Are you currently following a meal plan? No    Are you taking your medications as prescribed? Yes    Date Diagnosed 5 yrs ago      Health Coping   How would you rate your overall health? Good      Psychosocial Assessment   Patient  Belief/Attitude about Diabetes Afraid    How often do you need to have someone help you when you read instructions, pamphlets, or other written materials from your doctor or pharmacy? 4 - Often    What is the last grade level you completed in school? 8th      Complications   Last HgB A1C per patient/outside source 7.9 %    How often do you check your blood sugar? 1-2 times/day    Fasting Blood glucose range (mg/dL) 70-129   120-130   Number of hypoglycemic episodes per month 0    Have you had a dilated eye exam in the past 12 months? No    Have you had a dental exam in the past 12 months? No    Are you checking your feet? Yes    How many days per week are you checking your feet? 7      Dietary Intake   Breakfast grits OR eggs, cheese on 1/2 bun, diet Mtn Dew    Snack (morning) none    Lunch BLT OR Subway sandwhich cold cut    Snack (afternoon) none    Dinner fried chicken sandwhich, mac n cheese    Snack (evening) cookies & milk 1x/week;    Beverage(s) 2/ 12 oz diet Mtn  dew; a lot of water when mowing on the weekend, not much on weekdays when workng inside, mostly unsweet tea with a little sweet tea, no alcohol      Exercise   Exercise Type Moderate (swimming / aerobic walking)    How many days per week to you exercise? 2    How many minutes per day do you exercise? 120    Total minutes per week of exercise 240      Patient Education   Previous Diabetes Education Yes (please comment)   2017 at [place] management  Role of diet in the treatment of diabetes and the relationship between the three main macronutrients and blood glucose level;Carbohydrate counting    Physical activity and exercise  Role of exercise on diabetes management, blood pressure control and cardiac health.    Medications Reviewed patients medication for diabetes, action, purpose, timing of dose and side effects.    Monitoring Identified appropriate SMBG and/or A1C goals.;Purpose and frequency of SMBG.     Chronic complications Assessed and discussed foot care and prevention of foot problems      Individualized Goals (developed by patient)   Nutrition General guidelines for healthy choices and portions discussed    Monitoring  test my blood glucose as discussed      Outcomes   Expected Outcomes Demonstrated interest in learning. Expect positive outcomes    Future DMSE 2 months    Program Status Completed             Individualized Plan for Diabetes Self-Management Training:   Learning Objective:  Patient will have a greater understanding of diabetes self-management. Patient education plan is to attend individual and/or group sessions per assessed needs and concerns.    Patient Instructions  Goals: Change your bedtime from cookies and milk to a banana and a handful of nuts. Start drinking water more than diet soda Begin checking your blood sugar 2x/day: Check blood sugar at 2:30 each day and fasting  Expected Outcomes:  Demonstrated interest in learning. Expect positive outcomes  Education material provided: Food label handouts and A1C conversion sheet, Planning Healthy Meals  If problems or questions, patient to contact team via:  Phone and MyChart  Future DSME appointment: 2 months

## 2020-10-01 NOTE — Patient Instructions (Addendum)
Goals: Change your bedtime from cookies and milk to a banana and a handful of nuts. Start drinking water more than diet soda Begin checking your blood sugar 2x/day: Check blood sugar at 2:30 each day and fasting

## 2020-12-03 ENCOUNTER — Other Ambulatory Visit: Payer: Self-pay

## 2020-12-03 ENCOUNTER — Encounter: Payer: BC Managed Care – PPO | Attending: Family Medicine | Admitting: Registered"

## 2020-12-03 ENCOUNTER — Encounter: Payer: Self-pay | Admitting: Registered"

## 2020-12-03 DIAGNOSIS — E118 Type 2 diabetes mellitus with unspecified complications: Secondary | ICD-10-CM | POA: Insufficient documentation

## 2020-12-03 DIAGNOSIS — E1165 Type 2 diabetes mellitus with hyperglycemia: Secondary | ICD-10-CM | POA: Diagnosis not present

## 2020-12-03 DIAGNOSIS — IMO0002 Reserved for concepts with insufficient information to code with codable children: Secondary | ICD-10-CM

## 2020-12-03 NOTE — Progress Notes (Signed)
Diabetes Self-Management Education  Visit Type: Follow-up  Appt. Start Time: 1655 Appt. End Time: 1725  12/04/2020  [name], identified by name and date of birth, is a 55 y.o. male with a diagnosis of Diabetes: Type 2.   ASSESSMENT  There were no vitals taken for this visit. There is no height or weight on file to calculate BMI.  A1c 7.9%; Ozempic 2 mg; metformin 1000 bid s/p CABG 5 yrs ago  Pt reports his FBS avg 130s except when he eats cookies at night, 2x/week then his blood sugar runs 140-150s.Pt reports CBG readings 120-130 mg/dL during the day. Pt states his wife tries to encourage him not to eat the cookies at night.  RD provided education that patient will likely need additional medication to bring A1c down below 7%. Reducing cookie intake at night and employing stress management techniques may help reduce the need for more medication.  Beverages: Pt states during the week he drinks a bottle of water on way to work and 1 on the way home. He drinks 3 cans diet Mt Dew out of habit and because he likes it. 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   Pt states he still has high stress from his job and is interested in potential stress management techniques. Pt states he has a friend who teaches yoga and meditation. Pt states he falls asleep easily but wakes up during the night and cannot go back to sleep.   Diabetes Self-Management Education - 12/03/20 1658       Visit Information   Visit Type Follow-up      Initial Visit   Diabetes Type Type 2      Complications   Fasting Blood glucose range (mg/dL) [phone]   avg 914N, 2x week 140-150s     Dietary Intake   Breakfast egg mcmuffin OR country bbq bacon, grits, diet mt dew OR weekends bacon, egg, biscuit, unsweet/sweet tea    Lunch 6 in subway cold-cut with spinach, lite mayo, mustard 3x/week, diet Mtn Dew, may eat 1/2 bag  chips for a snack later    [employer], mashed potatoes, corn, green beans OR Japanese meal kits meal    Snack (evening) 2x week cookies and milk    Beverage(s) 2 bottle of water, 3 12-oz cans of Mtn Dew      Patient Education   Nutrition management  Other (comment)   milk, and water intake   Psychosocial adjustment Role of stress on diabetes      Individualized Goals (developed by patient)   Nutrition Other (comment)   reduce sweet intake   Problem Solving --   stress management     Outcomes   Expected Outcomes Demonstrated interest in learning. Expect positive outcomes    Future DMSE 2 months    Program Status Completed      Subsequent Visit   Since your last visit have you continued or begun to take your medications as prescribed? Yes    Since your last visit, are you checking your blood glucose at least once a day? Yes             Individualized Plan for Diabetes Self-Management Training:   Learning Objective:  Patient will have a greater understanding of diabetes self-management. Patient education plan is to attend individual and/or group sessions per assessed needs and concerns.    Patient Instructions  Consider working on stress reduction strategies Call your friend who teaches yoga/meditation [email or web address] Sleep hygiene tips Consider cutting out your evening Mtn Dew  Great job cutting back on cookies and milk.  Have 1-2 cookies 2x/week instead of 3-4 cookies  Expected Outcomes:  Demonstrated interest in learning. Expect positive outcomes  Education material provided: none  If problems or questions, patient to contact team via:  Phone and Mychart  Future DSME appointment: 2 months

## 2020-12-03 NOTE — Patient Instructions (Addendum)
Consider working on stress reduction strategies Call your friend who teaches yoga/meditation [email or web address] Sleep hygiene tips Consider cutting out your evening Mtn Dew  Great job cutting back on cookies and milk.  Have 1-2 cookies 2x/week instead of 3-4 cookies

## 2020-12-31 DIAGNOSIS — E785 Hyperlipidemia, unspecified: Secondary | ICD-10-CM | POA: Diagnosis not present

## 2020-12-31 DIAGNOSIS — Z951 Presence of aortocoronary bypass graft: Secondary | ICD-10-CM | POA: Diagnosis not present

## 2020-12-31 DIAGNOSIS — I2510 Atherosclerotic heart disease of native coronary artery without angina pectoris: Secondary | ICD-10-CM | POA: Diagnosis not present

## 2020-12-31 DIAGNOSIS — E1165 Type 2 diabetes mellitus with hyperglycemia: Secondary | ICD-10-CM | POA: Diagnosis not present

## 2021-01-02 ENCOUNTER — Encounter: Payer: Self-pay | Admitting: Internal Medicine

## 2021-01-02 ENCOUNTER — Other Ambulatory Visit: Payer: Self-pay

## 2021-01-02 ENCOUNTER — Ambulatory Visit: Payer: BC Managed Care – PPO | Admitting: Internal Medicine

## 2021-01-02 VITALS — BP 120/70 | HR 74 | Ht 66.0 in | Wt 174.8 lb

## 2021-01-02 DIAGNOSIS — E118 Type 2 diabetes mellitus with unspecified complications: Secondary | ICD-10-CM

## 2021-01-02 DIAGNOSIS — E1159 Type 2 diabetes mellitus with other circulatory complications: Secondary | ICD-10-CM

## 2021-01-02 DIAGNOSIS — E119 Type 2 diabetes mellitus without complications: Secondary | ICD-10-CM | POA: Insufficient documentation

## 2021-01-02 DIAGNOSIS — IMO0002 Reserved for concepts with insufficient information to code with codable children: Secondary | ICD-10-CM

## 2021-01-02 DIAGNOSIS — E1165 Type 2 diabetes mellitus with hyperglycemia: Secondary | ICD-10-CM | POA: Diagnosis not present

## 2021-01-02 LAB — GLUCOSE, POCT (MANUAL RESULT ENTRY): POC Glucose: 145 mg/dl — AB (ref 70–99)

## 2021-01-02 MED ORDER — EMPAGLIFLOZIN 10 MG PO TABS: 10.0000 mg | ORAL_TABLET | Freq: Every day | ORAL | 1 refills | Status: DC

## 2021-01-02 NOTE — Patient Instructions (Addendum)
-   Keep Up the Good Work ! - Continue Metformin 500 mg ,2 tablets in the morning and 2 tablets with Supper  - Hold off on Ozempic  - Start Jardiance 10 mg , 1 tablet every morning      HOW TO TREAT LOW BLOOD SUGARS (Blood sugar LESS THAN 70 MG/DL) Please follow the RULE OF 15 for the treatment of hypoglycemia treatment (when your (blood sugars are less than 70 mg/dL)   STEP 1: Take 15 grams of carbohydrates when your blood sugar is low, which includes:  3-4 GLUCOSE TABS  OR 3-4 OZ OF JUICE OR REGULAR SODA OR ONE TUBE OF GLUCOSE GEL    STEP 2: RECHECK blood sugar in 15 MINUTES STEP 3: If your blood sugar is still low at the 15 minute recheck --> then, go back to STEP 1 and treat AGAIN with another 15 grams of carbohydrates.

## 2021-01-02 NOTE — Progress Notes (Signed)
Name: Justin Olson  Age/ Sex: 55 y.o., male   MRN/ DOB: [id number], Mar 21, 1966     PCP: Ileana Ladd, MD   Reason for Endocrinology Evaluation: Type 2 Diabetes Mellitus  Initial Endocrine Consultative Visit: 07/10/2020    PATIENT IDENTIFIER: Justin Olson is a 55 y.o. male with a past medical history of T2DM and CAD. The patient has followed with Endocrinology clinic since 07/10/2020 for consultative assistance with management of his diabetes.  DIABETIC HISTORY:  [name] was diagnosed with DM in 2017,  Bydureon- skin knots . His hemoglobin A1c has ranged from  7.9%  in 2017, peaking at 8.8 % in 2022.    On his initial visit to our clinic he had an A1c of 8.8%, His Ozempic was recently increased and we opted not to change and conitnued Metformin   SUBJECTIVE:   During the last visit (07/10/2020): A1c of 8.8%, His Ozempic was recently increased and we opted not to change and conitnued Metformin      Today (01/02/2021): Justin Olson is here for a follow up on diabetes management.  He checks his blood sugars 1 times daily. The patient has not had hypoglycemic episodes since the last clinic visit.    HOME DIABETES REGIMEN:  Metformin 500 mg ,2 tablets in the morning and 2 tablets with Supper  Ozempic 1 mg weekly ( Sunday )       Statin: yes ACE-I/ARB: yes Prior Diabetic Education: no   METER DOWNLOAD SUMMARY: Did not bring     DIABETIC COMPLICATIONS: Microvascular complications:   Denies: CKD, neuropathy, retinopathy Last Eye Exam: Completed 04/2017    Macrovascular complications:  CAD(S/P CABG ) Denies: CVA, PVD   HISTORY:  Past Medical History:  Past Medical History:  Diagnosis Date   Coronary artery disease    Diabetes mellitus type 2, noninsulin dependent (HCC) 08/2015   Dyslipidemia associated with type 2 diabetes mellitus (HCC) 08/2015   Tobacco abuse    Past Surgical History:  Past Surgical History:  Procedure Laterality Date   CARDIAC CATHETERIZATION  N/A 08/28/2015   Procedure: Left Heart Cath and Coronary Angiography;  Surgeon: Runell Gess, MD;  Location: [facility];  Service: Cardiovascular;  Laterality: N/A;   CORONARY ARTERY BYPASS GRAFT N/A 08/30/2015   Procedure: CORONARY ARTERY BYPASS GRAFTING (CABG) x2 using left internal mammary artery and right greater saphenous vein. ;  Surgeon: Kerin Perna, MD;  LIMA-LAD, SVG-RI   TEE WITHOUT CARDIOVERSION N/A 08/30/2015   Procedure: TRANSESOPHAGEAL ECHOCARDIOGRAM (TEE);  Surgeon: Kerin Perna, MD;  Location: [facility];  Service: Open Heart Surgery;  Laterality: N/A;   Social History:  reports that he quit smoking about 5 years ago. His smoking use included cigarettes. He smoked an average of 1.5 packs per day. He has never used smokeless tobacco. He reports that he does not currently use alcohol. He reports that he does not use drugs. Family History:  Family History  Problem Relation Age of Onset   Coronary artery disease Father        MI   Coronary artery disease Sister        s/p CABG x5   Drug abuse Brother    Heart disease Sister      HOME MEDICATIONS: Allergies as of 01/02/2021       Reactions   Lisinopril    Stomach cramping, severe constipation        Medication List  Accurate as of January 02, 2021  9:41 AM. If you have any questions, ask your nurse or doctor.          aspirin 81 MG tablet Take 81 mg by mouth daily.   atorvastatin 80 MG tablet Commonly known as: LIPITOR Take 1 tablet (80 mg total) by mouth daily at 6 PM.   CONTOUR BLOOD GLUCOSE SYSTEM Devi Inject 1 Device as directed 2 (two) times daily.   fexofenadine 180 MG tablet Commonly known as: ALLEGRA Take 180 mg by mouth daily.   freestyle lancets Use as instructed   losartan 100 MG tablet Commonly known as: COZAAR Take 1 tablet (100 mg total) by mouth every evening.   metFORMIN 500 MG tablet Commonly known as: GLUCOPHAGE Take 1 tablet (500 mg total) by mouth daily  with breakfast. What changed:  how much to take when to take this additional instructions   metoprolol tartrate 25 MG tablet Commonly known as: LOPRESSOR Take 12.5 mg by mouth 2 (two) times daily.   nitroGLYCERIN 0.4 MG SL tablet Commonly known as: NITROSTAT Place 1 tablet (0.4 mg total) under the tongue every 5 (five) minutes as needed for up to 25 days for chest pain.   OneTouch Verio test strip Generic drug: glucose blood Use as instructed   OZEMPIC (0.25 OR 0.5 MG/DOSE) [place] Inject 1 mL into the skin once a week. 0.5 mg SQ weekly   pantoprazole 40 MG tablet Commonly known as: PROTONIX Take 40 mg by mouth daily.         OBJECTIVE:   Vital Signs: BP 120/70 (BP Location: Left Arm, Patient Position: Sitting, Cuff Size: Small)   Pulse 74   Ht 5\' 6"  (1.676 m)   Wt 174 lb 12.8 oz (79.3 kg)   SpO2 96%   BMI 28.21 kg/m   Wt Readings from Last 3 Encounters:  01/02/21 174 lb 12.8 oz (79.3 kg)  08/10/20 181 lb (82.1 kg)  07/10/20 181 lb 6 oz (82.3 kg)     Exam: General: Pt appears well and is in NAD  Lungs: Clear with good BS bilat with no rales, rhonchi, or wheezes  Heart: RRR   Abdomen: Normoactive bowel sounds, soft, nontender, without masses or organomegaly palpable  Extremities: No pretibial edema.   Neuro: MS is good with appropriate affect, pt is alert and Ox3   DM foot exam: 07/10/2020   The skin of the feet is intact without sores or ulcerations. The pedal pulses are 1+ bilaterally  The sensation is intact to a screening 5.07, 10 gram monofilament bilaterally            DATA REVIEWED:  Lab Results  Component Value Date   HGBA1C 7.9 (H) 08/29/2015   HGBA1C 7.9 (H) 08/28/2015   Lab Results  Component Value Date   LDLCALC 124 (H) 08/29/2015   CREATININE 0.84 09/03/2015   No results found for: [facility]   Lab Results  Component Value Date   CHOL 189 08/29/2015   HDL 30 (L) 08/29/2015   LDLCALC 124 (H) 08/29/2015   TRIG 175 (H)  08/29/2015   CHOLHDL 6.3 08/29/2015         ASSESSMENT / PLAN / RECOMMENDATIONS:   1) Type 2 Diabetes Mellitus, Sub-optimally controlled, With Macrovascular  complications - Most recent A1c of 7.3 %. Goal A1c < 7.0 %.    - I have praised the pt on improved glycemic control  - He has been out of Ozempic for the past 3 week,  due to back order. We discussed starting SGLT-2 inhibitors due to cardiovascular benefits, cautioned against genital infections     MEDICATIONS: - Continue Metformin 500 mg ,2 tablets in the morning and 2 tablets with Supper  - Hold off on Ozempic  - Start Jardiance 10 mg , 1 tablet every morning   EDUCATION / INSTRUCTIONS: BG monitoring instructions: Patient is instructed to check his blood sugars 1 times a day, fasting . Call [place] [facility] if: BG persistently < 70  I reviewed the Rule of 15 for the treatment of hypoglycemia in detail with the patient. Literature supplied.   2) Diabetic complications:  Eye: Does not have known diabetic retinopathy. Pt referred to Ophthalmology  Neuro/ Feet: Does not have known diabetic peripheral neuropathy .  Renal: Patient does not have known baseline CKD. He   is  on an ACEI/ARB at present.      3) Dyslipidemia: Patient is on Atorvastatin 80 mg daily  - LDL at goal     F/U in 4 months     Signed electronically by: Lyndle Herrlich, MD  [facility]  [facility] [address] [place] [place], [place] [address] Phone: [phone] FAX: [phone]   CC: Ileana Ladd, MD [address] [place] [address] Phone: [phone]  Fax: [phone]  Return to Endocrinology clinic as below: Future Appointments  Date Time Provider Department Center  01/14/2021  4:45 PM Carolan Shiver, RD [facility] NDM  02/11/2021  3:30 PM Yates Decamp, MD [facility] None

## 2021-01-14 ENCOUNTER — Ambulatory Visit: Payer: BC Managed Care – PPO | Admitting: Registered"

## 2021-02-04 ENCOUNTER — Ambulatory Visit: Payer: BC Managed Care – PPO | Admitting: Registered"

## 2021-02-11 ENCOUNTER — Ambulatory Visit: Payer: BC Managed Care – PPO | Admitting: Cardiology

## 2021-02-11 ENCOUNTER — Other Ambulatory Visit: Payer: Self-pay

## 2021-02-11 ENCOUNTER — Encounter: Payer: Self-pay | Admitting: Cardiology

## 2021-02-11 VITALS — BP 130/76 | HR 54 | Temp 98.6°F | Resp 16 | Ht 66.0 in | Wt 176.0 lb

## 2021-02-11 DIAGNOSIS — Z951 Presence of aortocoronary bypass graft: Secondary | ICD-10-CM | POA: Diagnosis not present

## 2021-02-11 DIAGNOSIS — I10 Essential (primary) hypertension: Secondary | ICD-10-CM | POA: Diagnosis not present

## 2021-02-11 DIAGNOSIS — I2510 Atherosclerotic heart disease of native coronary artery without angina pectoris: Secondary | ICD-10-CM | POA: Diagnosis not present

## 2021-02-11 DIAGNOSIS — E7800 Pure hypercholesterolemia, unspecified: Secondary | ICD-10-CM | POA: Diagnosis not present

## 2021-02-11 NOTE — Progress Notes (Signed)
Primary Physician/Referring:  Vernie Shanks, MD  Patient ID: Justin Olson, male    DOB: 10-14-1965, 55 y.o.   MRN: [id number]  Chief Complaint  Patient presents with   Coronary Artery Disease   Hyperlipidemia   Follow-up    6 month   HPI:    Justin Olson  is a 55 y.o. Caucasian male  with history of heavy tobacco use disorder quit in 2017, diabetes and hyperlipidemia. family history of premature coronary artery disease with NSTEMI on 08/27/2016 with non-ST elevation myocardial infarction. Due to ostial left main, 95% stenosis underwent emergent CABG with LIMA to LAD and SVG to ramus intermediate on 08/30/2015 by Ivin Poot, MD.    Patient was last seen in our office 08/10/2020 by Dr. Einar Gip at which time it had been >5 years since last cardiac evaluation, therefore ordered echocardiogram and nuclear stress test.  Echocardiogram revealed preserved LVEF without significant abnormality and nuclear stress test was overall low risk.  Patient remains asymptomatic, denies chest pain, palpitations, syncope, near syncope, dyspnea.  Denies orthopnea, PND, leg edema.  Patient continues to work without issue and has remained abstinent from tobacco.  Past Medical History:  Diagnosis Date   Coronary artery disease    Diabetes mellitus type 2, noninsulin dependent ([place]) 08/2015   Dyslipidemia associated with type 2 diabetes mellitus ([place]) 08/2015   Tobacco abuse    Past Surgical History:  Procedure Laterality Date   CARDIAC CATHETERIZATION N/A 08/28/2015   Procedure: Left Heart Cath and Coronary Angiography;  Surgeon: Lorretta Harp, MD;  Location: [facility] [facility];  Service: Cardiovascular;  Laterality: N/A;   CORONARY ARTERY BYPASS GRAFT N/A 08/30/2015   Procedure: CORONARY ARTERY BYPASS GRAFTING (CABG) x2 using left internal mammary artery and right greater saphenous vein. ;  Surgeon: Ivin Poot, MD;  LIMA-LAD, SVG-RI   TEE WITHOUT CARDIOVERSION N/A 08/30/2015   Procedure:  TRANSESOPHAGEAL ECHOCARDIOGRAM (TEE);  Surgeon: Ivin Poot, MD;  Location: [place];  Service: Open Heart Surgery;  Laterality: N/A;   Family History  Problem Relation Age of Onset   Coronary artery disease Father        MI   Coronary artery disease Sister        s/p CABG x5   Drug abuse Brother    Heart disease Sister     Social History   Tobacco Use   Smoking status: Former    Packs/day: 1.50    Years: 45.00    Pack years: 67.50    Types: Cigarettes    Quit date: 08/28/2015    Years since quitting: 5.4   Smokeless tobacco: Never   Tobacco comments:    30  Substance Use Topics   Alcohol use: Not Currently   Marital Status: Married  ROS  Review of Systems  Cardiovascular:  Negative for chest pain, dyspnea on exertion, leg swelling and palpitations.  Respiratory:  Negative for shortness of breath.   Musculoskeletal:  Positive for back pain.  Gastrointestinal:  Negative for melena.  Objective  Blood pressure 130/76, pulse (!) 54, temperature 98.6 F (37 C), resp. rate 16, height _0  (1.676 m), weight 176 lb (79.8 kg), SpO2 97 %.  Vitals with BMI 02/11/2021 01/02/2021 08/10/2020  Height _1  _2  _3   Weight 176 lbs 174 lbs 13 oz 181 lbs  BMI 28.42 29.92 42.68  Systolic 341 962 229  Diastolic 76 70 79  Pulse 54 74 67  Physical Exam Vitals reviewed.  HENT:     Head: Normocephalic and atraumatic.  Cardiovascular:     Rate and Rhythm: Normal rate and regular rhythm.     Pulses: Intact distal pulses.     Heart sounds: Normal heart sounds, S1 normal and S2 normal. No murmur heard.   No gallop.     Comments: No JVD. Pulmonary:     Effort: Pulmonary effort is normal. No respiratory distress.     Breath sounds: Normal breath sounds. No wheezing, rhonchi or rales.  Abdominal:     Hernia: A hernia is present. Hernia is present in the ventral area.  Musculoskeletal:     Right lower leg: No edema.     Left lower leg: No edema.  Neurological:     Mental Status:  He is alert.   Laboratory examination:   External labs:  08/10/2020: A1c 7.9% BUN 15, creatinine 0.90, GFR >60, sodium 139, potassium 4.0, AST 10, ALT 10 Total cholesterol 114, HDL 33, triglycerides 159, LDL 54  Labs 05/11/2020:  A1c 8.8%.  Serum glucose 117 mg, BUN 9, creatinine 0.78, potassium 4.5, EGFR >60 mL.  CMP otherwise normal.  Cholesterol, total 122.000 08/15/2019 HDL 34.000 08/15/2019 LDL-C 61.000 08/15/2019 Triglycerides 156.000 08/15/2019  A1C 11.900 08/15/2019  Creatinine, Serum 0.780 08/15/2019 Potassium 4.300 08/15/2019 Magnesium 2.100 MG/ 03/10/2016 ALT (SGPT) 17.000 08/15/2019  04/23/2018: A1c 6.3%.  Serum glucose 113 mg, BUN 15, creatinine 0.77, EGFR greater than 61, potassium 4.2, CMP normal.   Allergies   Allergies  Allergen Reactions   Lisinopril     Stomach cramping, severe constipation    Medications Prior to Visit:   Outpatient Medications Prior to Visit  Medication Sig Dispense Refill   aspirin 81 MG tablet Take 81 mg by mouth daily.     atorvastatin (LIPITOR) 80 MG tablet Take 1 tablet (80 mg total) by mouth daily at 6 PM. 30 tablet 1   Blood Glucose Monitoring Suppl (CONTOUR BLOOD GLUCOSE SYSTEM) DEVI Inject 1 Device as directed 2 (two) times daily. 1 Device 0   empagliflozin (JARDIANCE) 10 MG TABS tablet Take 1 tablet (10 mg total) by mouth daily before breakfast. 90 tablet 1   fexofenadine (ALLEGRA) 180 MG tablet Take 180 mg by mouth daily.     glucose blood (ONETOUCH VERIO) test strip Use as instructed 100 each 12   Lancets (FREESTYLE) lancets Use as instructed 100 each 12   losartan (COZAAR) 100 MG tablet Take 1 tablet (100 mg total) by mouth every evening. 90 tablet 3   metFORMIN (GLUCOPHAGE) 500 MG tablet Take 1 tablet (500 mg total) by mouth daily with breakfast. (Patient taking differently: Take 1,000 mg by mouth 2 (two) times daily with a meal. 2 tablets in the morning and 2 tablets in the evening) 30 tablet 1   metoprolol tartrate  (LOPRESSOR) 25 MG tablet Take 12.5 mg by mouth 2 (two) times daily.     nitroGLYCERIN (NITROSTAT) 0.4 MG SL tablet Place 1 tablet (0.4 mg total) under the tongue every 5 (five) minutes as needed for up to 25 days for chest pain. 25 tablet 3   pantoprazole (PROTONIX) 40 MG tablet Take 40 mg by mouth daily.     Semaglutide (OZEMPIC, 0.25 OR 0.5 MG/DOSE, [place]) Inject 1 mL into the skin once a week. 0.5 mg SQ weekly     No facility-administered medications prior to visit.   Final Medications at End of Visit    Current Meds  Medication Sig  aspirin 81 MG tablet Take 81 mg by mouth daily.   atorvastatin (LIPITOR) 80 MG tablet Take 1 tablet (80 mg total) by mouth daily at 6 PM.   Blood Glucose Monitoring Suppl (CONTOUR BLOOD GLUCOSE SYSTEM) DEVI Inject 1 Device as directed 2 (two) times daily.   empagliflozin (JARDIANCE) 10 MG TABS tablet Take 1 tablet (10 mg total) by mouth daily before breakfast.   fexofenadine (ALLEGRA) 180 MG tablet Take 180 mg by mouth daily.   glucose blood (ONETOUCH VERIO) test strip Use as instructed   Lancets (FREESTYLE) lancets Use as instructed   losartan (COZAAR) 100 MG tablet Take 1 tablet (100 mg total) by mouth every evening.   metFORMIN (GLUCOPHAGE) 500 MG tablet Take 1 tablet (500 mg total) by mouth daily with breakfast. (Patient taking differently: Take 1,000 mg by mouth 2 (two) times daily with a meal. 2 tablets in the morning and 2 tablets in the evening)   metoprolol tartrate (LOPRESSOR) 25 MG tablet Take 12.5 mg by mouth 2 (two) times daily.   nitroGLYCERIN (NITROSTAT) 0.4 MG SL tablet Place 1 tablet (0.4 mg total) under the tongue every 5 (five) minutes as needed for up to 25 days for chest pain.   pantoprazole (PROTONIX) 40 MG tablet Take 40 mg by mouth daily.   Radiology:   No results found.  Cardiac Studies:   Coronary angiogram 08/27/2016: LM ostial 95% S/P LIMA to LAD and SVG to ramus intermediate on 08/30/2015 by Ivin Poot, MD.  [facility]  ECHOCARDIOGRAM COMPLETE 50/35/4656 Normal LV systolic function with visual EF 55-60%. Left ventricle cavity is normal in size. Normal global wall motion. Normal diastolic filling pattern, normal LAP. Trace tricuspid  regurgitation. No evidence of pulmonary hypertension. Compared to prior study 10/17/2015: no significant change.   [facility] MYOCARDIAL PERFUSION WO LEXISCAN 08/27/2020 Exercise nuclear stress test was performed using [place] protocol. Patient reached 12 METS, and 96% of age predicted maximum heart rate. Exercise capacity was excellent. No chest pain reported. Heart rate and hemodynamic response were normal. Stress EKG revealed no ischemic changes. Normal myocardial perfusion. Stress LVEF 61%. Low risk study.  EKG   02/11/2021: Sinus rhythm at a rate of 53 bpm.  Normal axis.  Inferior infarct old, posterior infarct old.  Compared to EKG 08/10/2020, no significant change.  08/10/2020: Normal sinus rhythm at rate of 64 bpm, normal axis, inferior infarct old, posterior infarct old.  No evidence of ischemia.  No significant change from 08/01/2018   Assessment     ICD-10-CM   1. Coronary artery disease involving native coronary artery of native heart without angina pectoris  I25.10 EKG 12-Lead    2. S/P CABG x 2  Z95.1     3. Hypercholesterolemia  E78.00     4. Primary hypertension  I10       No orders of the defined types were placed in this encounter.   Medications Discontinued During This Encounter  Medication Reason   Semaglutide (OZEMPIC, 0.25 OR 0.5 MG/DOSE, [place]) Error     Recommendations:   Justin Olson  is a 55 y.o. Caucasian male  with history of heavy tobacco use disorder quit in 2017, diabetes and hyperlipidemia. family history of premature coronary artery disease with NSTEMI on 08/27/2016 with non-ST elevation myocardial infarction. Due to ostial left main, 95% stenosis underwent emergent CABG with LIMA to LAD and SVG to ramus intermediate on 08/30/2015 by Ivin Poot,  MD.   Reviewed and discussed with patient results of echocardiogram  and nuclear stress test, details above.  Echocardiogram noted preserved LVEF without significant abnormalities and stress test was overall low risk.  Patient remains asymptomatic.  He is now on Jardiance and diabetes control has improved, he continues to follow mostly with Dr. Jacelyn Grip.   I personally reviewed external labs, LDL is well controlled.  Triglycerides are elevated, likely secondary to uncontrolled diabetes.  Counseled patient regarding the importance of diet and lifestyle modifications and diabetes control, particularly given history of CAD.  Patient's blood pressure is well controlled.  Physical exam and EKG remained stable compared to previous office visit.  Follow-up in 1 year, sooner if needed, for hypertension, CAD, hyperlipidemia.   Alethia Berthold, PA-C 02/11/2021, 4:03 PM Office: [phone]

## 2021-03-06 ENCOUNTER — Encounter: Payer: BC Managed Care – PPO | Admitting: Registered"

## 2021-04-06 DIAGNOSIS — H109 Unspecified conjunctivitis: Secondary | ICD-10-CM | POA: Diagnosis not present

## 2021-04-22 ENCOUNTER — Encounter: Payer: BC Managed Care – PPO | Attending: Family Medicine | Admitting: Registered"

## 2021-04-22 ENCOUNTER — Encounter: Payer: Self-pay | Admitting: Registered"

## 2021-04-22 ENCOUNTER — Other Ambulatory Visit: Payer: Self-pay

## 2021-04-22 DIAGNOSIS — E1159 Type 2 diabetes mellitus with other circulatory complications: Secondary | ICD-10-CM | POA: Diagnosis not present

## 2021-04-22 NOTE — Patient Instructions (Signed)
Continue drinking more water and less diet Mt Dew Cut down diet Mt Dew and switch to caffeine-free  Good work on bringing down your A1c Aim for 1 cookie instead of 2 cookies for your nighttime snack. Continue being active in the winter to help maintain the gains in improved blood sugar. Walk 3x week for 25-30 minutes.

## 2021-04-22 NOTE — Progress Notes (Signed)
Diabetes Self-Management Education  Visit Type: Follow-up  Appt. Start Time: 1533 Appt. End Time: 1600  04/22/2021  [name], identified by name and date of birth, is a 56 y.o. male with a diagnosis of Diabetes:  .   ASSESSMENT  There were no vitals taken for this visit. There is no height or weight on file to calculate BMI.  A1c patient states "6-something"% and states his PCP called him to let him know how impressed he was with his improvement Medications: Jardiance; (Ozempic discontinued) metformin 1000 bid Medical History: s/p CABG 5 yrs ago  Pt states Thanksgiving and Christmas were difficult with watching diet, however at Thanksgiving Pt made the switch from drinking 1/2 & 1/2 sweet/unsweet tea to straight unsweet tea!  Pt states occasionally CBG goes over 200 mg/dL, but usually stays ~150-160 after meals.   Pt states Dr. Jacelyn Grip wants him to follow a vegan diet, but states he cannot give up meat. However, pt states he has decreased meat intake and increased vegetables.  Pt has worked on his goals from last visit and has successfully cut back intake of cookies and diet Mtn dew.   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   Pt states his stress and blood sugar are pretty low, but he continues to wake up middle of the night. Pt states he has not contacted his friend who teaches yoga yet. As pt switches to caffeine free soda sleep may improve.    Diabetes Self-Management Education - 04/22/21 1545       Visit Information   Visit Type Follow-up      Complications   Fasting Blood glucose range (mg/dL) 70-129   120-130   Number of hypoglycemic episodes per month 0      Dietary Intake   Breakfast gravy biscuit, diet Mtn Dew    Lunch hot dog mustard and chili (quick lunch), [employer], rice, pinto beans, green beans    Dinner broiled fish, mac n cheese, milk    Snack (evening) 2  cookies, glass of milk    Beverage(s) 3 bottles of water, 2 12-oz cans of diet Mtg Dew, unsweet tea      Exercise   Exercise Type ADL's      Patient Education   Physical activity and exercise  Role of exercise on diabetes management, blood pressure control and cardiac health.    Medications Reviewed patients medication for diabetes, action, purpose, timing of dose and side effects.      Individualized Goals (developed by patient)   Nutrition Other (comment)   continue increasing water, decreasing diet soda   Physical Activity Exercise 3-5 times per week      Patient Self-Evaluation of Goals - Patient rates self as meeting previously set goals (% of time)   Nutrition >75%      Outcomes   Expected Outcomes Demonstrated interest in learning. Expect positive outcomes    Future DMSE 3-4 months    Program Status Not Completed      Subsequent Visit   Since your last visit have you continued or begun to take your medications as prescribed? Yes    Since your last visit have you had your blood pressure checked? Yes    Is your most recent blood pressure lower, unchanged, or higher since your last visit? Unchanged    Since your last visit, are you checking your blood glucose at least once  a day? Yes             Individualized Plan for Diabetes Self-Management Training:   Learning Objective:  Patient will have a greater understanding of diabetes self-management. Patient education plan is to attend individual and/or group sessions per assessed needs and concerns.   Patient Instructions  Continue drinking more water and less diet Mt Dew Cut down diet Mt Dew and switch to caffeine-free  Good work on bringing down your A1c Aim for 1 cookie instead of 2 cookies for your nighttime snack. Continue being active in the winter to help maintain the gains in improved blood sugar. Walk 3x week for 25-30 minutes.  Expected Outcomes:  Demonstrated interest in learning. Expect positive  outcomes  Education material provided: none  If problems or questions, patient to contact team via:  Phone and MyChart  Future DSME appointment: 3-4 months

## 2021-05-06 ENCOUNTER — Ambulatory Visit: Payer: BC Managed Care – PPO | Admitting: Internal Medicine

## 2021-05-13 ENCOUNTER — Ambulatory Visit: Payer: BC Managed Care – PPO | Admitting: Internal Medicine

## 2021-05-13 ENCOUNTER — Other Ambulatory Visit: Payer: Self-pay

## 2021-05-13 ENCOUNTER — Encounter: Payer: Self-pay | Admitting: Internal Medicine

## 2021-05-13 VITALS — BP 124/86 | HR 88 | Ht 66.0 in | Wt 179.0 lb

## 2021-05-13 DIAGNOSIS — R739 Hyperglycemia, unspecified: Secondary | ICD-10-CM | POA: Diagnosis not present

## 2021-05-13 DIAGNOSIS — E1159 Type 2 diabetes mellitus with other circulatory complications: Secondary | ICD-10-CM | POA: Diagnosis not present

## 2021-05-13 DIAGNOSIS — E1165 Type 2 diabetes mellitus with hyperglycemia: Secondary | ICD-10-CM | POA: Diagnosis not present

## 2021-05-13 LAB — POCT GLYCOSYLATED HEMOGLOBIN (HGB A1C): Hemoglobin A1C: 8.9 % — AB (ref 4.0–5.6)

## 2021-05-13 LAB — POCT GLUCOSE (DEVICE FOR HOME USE): Glucose Fasting, POC: 220 mg/dL — AB (ref 70–99)

## 2021-05-13 MED ORDER — EMPAGLIFLOZIN 25 MG PO TABS: 25.0000 mg | ORAL_TABLET | Freq: Every day | ORAL | 3 refills | Status: DC

## 2021-05-13 MED ORDER — GLIPIZIDE 5 MG PO TABS: 5.0000 mg | ORAL_TABLET | Freq: Every day | ORAL | 2 refills | Status: DC

## 2021-05-13 NOTE — Patient Instructions (Signed)
-   Continue Metformin 500 mg ,2 tablets in the morning and 2 tablets with Supper  - Increase Jardiance 25 mg , 1 tablet every morning  - Start Glipizide 5 mg , 1 tablet before supper     HOW TO TREAT LOW BLOOD SUGARS (Blood sugar LESS THAN 70 MG/DL) Please follow the RULE OF 15 for the treatment of hypoglycemia treatment (when your (blood sugars are less than 70 mg/dL)   STEP 1: Take 15 grams of carbohydrates when your blood sugar is low, which includes:  3-4 GLUCOSE TABS  OR 3-4 OZ OF JUICE OR REGULAR SODA OR ONE TUBE OF GLUCOSE GEL    STEP 2: RECHECK blood sugar in 15 MINUTES STEP 3: If your blood sugar is still low at the 15 minute recheck --> then, go back to STEP 1 and treat AGAIN with another 15 grams of carbohydrates.

## 2021-05-13 NOTE — Progress Notes (Signed)
Name: Justin Olson  Age/ Sex: 56 y.o., male   MRN/ DOB: [id number], 1965-07-07     PCP: Vernie Shanks, MD   Reason for Endocrinology Evaluation: Type 2 Diabetes Mellitus  Initial Endocrine Consultative Visit: 07/10/2020    PATIENT IDENTIFIER: Justin Olson is a 56 y.o. male with a past medical history of T2DM and CAD. The patient has followed with Endocrinology clinic since 07/10/2020 for consultative assistance with management of his diabetes.  DIABETIC HISTORY:  Mr. Delfavero was diagnosed with DM in 2017,  Bydureon- skin knots . His hemoglobin A1c has ranged from  7.9%  in 2017, peaking at 8.8 % in 2022.    On his initial visit to our clinic he had an A1c of 8.8%, His Ozempic was recently increased and we opted not to change and conitnued Metformin   We will switch Ozempic to Jardiance due to change supply interruptions in 12/2020  SUBJECTIVE:   During the last visit (01/02/2021): A1c of 7.3%, will continue on metformin, and started Jardiance while holding Ozempic    Today (05/13/2021): [name] is here for a follow up on diabetes management.  He checks his blood sugars occasionally. The patient has not had hypoglycemic episodes since the last clinic visit.  He has been eating lasagne , mac and cheese  Denies nausea vomiting or diarrhea  HOME DIABETES REGIMEN:  Metformin 500 mg ,2 tablets in the morning and 2 tablets with Supper  Jardiance 10 mg daily      Statin: yes ACE-I/ARB: yes Prior Diabetic Education: no   METER DOWNLOAD SUMMARY: Did not bring     DIABETIC COMPLICATIONS: Microvascular complications:   Denies: CKD, neuropathy, retinopathy Last Eye Exam: Completed 04/2017    Macrovascular complications:  CAD(S/P CABG ) Denies: CVA, PVD   HISTORY:  Past Medical History:  Past Medical History:  Diagnosis Date   Coronary artery disease    Diabetes mellitus type 2, noninsulin dependent ([place]) 08/2015   Dyslipidemia associated with type 2 diabetes mellitus  ([place] [facility]) 08/2015   Tobacco abuse    Past Surgical History:  Past Surgical History:  Procedure Laterality Date   CARDIAC CATHETERIZATION N/A 08/28/2015   Procedure: Left Heart Cath and Coronary Angiography;  Surgeon: Lorretta Harp, MD;  Location: [facility];  Service: Cardiovascular;  Laterality: N/A;   CORONARY ARTERY BYPASS GRAFT N/A 08/30/2015   Procedure: CORONARY ARTERY BYPASS GRAFTING (CABG) x2 using left internal mammary artery and right greater saphenous vein. ;  Surgeon: Ivin Poot, MD;  LIMA-LAD, SVG-RI   TEE WITHOUT CARDIOVERSION N/A 08/30/2015   Procedure: TRANSESOPHAGEAL ECHOCARDIOGRAM (TEE);  Surgeon: Ivin Poot, MD;  Location: [facility];  Service: Open Heart Surgery;  Laterality: N/A;   Social History:  reports that he quit smoking about 5 years ago. His smoking use included cigarettes. He has a 67.50 pack-year smoking history. He has never used smokeless tobacco. He reports that he does not currently use alcohol. He reports that he does not use drugs. Family History:  Family History  Problem Relation Age of Onset   Coronary artery disease Father        MI   Coronary artery disease Sister        s/p CABG x5   Drug abuse Brother    Heart disease Sister      HOME MEDICATIONS: Allergies as of 05/13/2021       Reactions   Lisinopril    Stomach cramping, severe constipation  Medication List        Accurate as of May 13, 2021  3:48 PM. If you have any questions, ask your nurse or doctor.          STOP taking these medications    freestyle lancets Stopped by: Dorita Sciara, MD       TAKE these medications    aspirin 81 MG tablet Take 81 mg by mouth daily.   atorvastatin 80 MG tablet Commonly known as: LIPITOR Take 1 tablet (80 mg total) by mouth daily at 6 PM.   CONTOUR BLOOD GLUCOSE SYSTEM Devi Inject 1 Device as directed 2 (two) times daily.   empagliflozin 25 MG Tabs tablet Commonly known as: Jardiance Take 1  tablet (25 mg total) by mouth daily before breakfast. What changed:  medication strength how much to take Changed by: Dorita Sciara, MD   fexofenadine 180 MG tablet Commonly known as: ALLEGRA Take 180 mg by mouth daily.   glipiZIDE 5 MG tablet Commonly known as: GLUCOTROL Take 1 tablet (5 mg total) by mouth daily before supper. Started by: Dorita Sciara, MD   losartan 100 MG tablet Commonly known as: COZAAR Take 1 tablet (100 mg total) by mouth every evening.   losartan 50 MG tablet Commonly known as: COZAAR Take 50 mg by mouth daily.   metFORMIN 500 MG tablet Commonly known as: GLUCOPHAGE Take 1 tablet (500 mg total) by mouth daily with breakfast. What changed:  how much to take when to take this additional instructions   metoprolol tartrate 25 MG tablet Commonly known as: LOPRESSOR Take 12.5 mg by mouth 2 (two) times daily.   nitroGLYCERIN 0.4 MG SL tablet Commonly known as: NITROSTAT Place 1 tablet (0.4 mg total) under the tongue every 5 (five) minutes as needed for up to 25 days for chest pain.   OneTouch Verio test strip Generic drug: glucose blood Use as instructed   pantoprazole 40 MG tablet Commonly known as: PROTONIX Take 40 mg by mouth daily.         OBJECTIVE:   Vital Signs: BP 124/86 (BP Location: Left Arm, Patient Position: Sitting, Cuff Size: Small)    Pulse 88    Ht _0  (1.676 m)    Wt 179 lb (81.2 kg)    SpO2 96%    BMI 28.89 kg/m   Wt Readings from Last 3 Encounters:  05/13/21 179 lb (81.2 kg)  02/11/21 176 lb (79.8 kg)  01/02/21 174 lb 12.8 oz (79.3 kg)     Exam: General: Pt appears well and is in NAD  Lungs: Clear with good BS bilat with no rales, rhonchi, or wheezes  Heart: RRR   Abdomen: Normoactive bowel sounds, soft, nontender, without masses or organomegaly palpable  Extremities: No pretibial edema.   Neuro: MS is good with appropriate affect, pt is alert and Ox3   DM foot exam: 07/10/2020   The skin of the  feet is intact without sores or ulcerations. The pedal pulses are 1+ bilaterally  The sensation is intact to a screening 5.07, 10 gram monofilament bilaterally            DATA REVIEWED:  Lab Results  Component Value Date   HGBA1C 8.9 (A) 05/13/2021   HGBA1C 7.9 (H) 08/29/2015   HGBA1C 7.9 (H) 08/28/2015   Lab Results  Component Value Date   LDLCALC 124 (H) 08/29/2015   CREATININE 0.84 09/03/2015   No results found for: [facility]   Lab Results  Component Value Date   CHOL 189 08/29/2015   HDL 30 (L) 08/29/2015   LDLCALC 124 (H) 08/29/2015   TRIG 175 (H) 08/29/2015   CHOLHDL 6.3 08/29/2015         ASSESSMENT / PLAN / RECOMMENDATIONS:   1) Type 2 Diabetes Mellitus, Poorly  controlled, With Macrovascular  complications - Most recent A1c of 8.9%. Goal A1c < 7.0 %.    -I will see has increased from 7.3% to 8.9%, this is multifactorial given discontinuation of Ozempic due to supply chain issues, dietary indiscretions (has been craving pasta and eating it on average 3 times a week)  -He is motivated to improve his glycemic control again -Labs done at PCP recently.  Patient -We discussed starting glipizide, cautioned against hypoglycemia, so he will take it before dinner as he is not able to take it before breakfast, he eats breakfast much later than medication intake which will increase risk of hypoglycemia -We will also increase Jardiance   MEDICATIONS: - Continue Metformin 500 mg ,2 tablets in the morning and 2 tablets with Supper  -Increase Jardiance 25 mg , 1 tablet every morning  - Start glipizide 5 mg, 1 tablet before supper  EDUCATION / INSTRUCTIONS: BG monitoring instructions: Patient is instructed to check his blood sugars 1 times a day, fasting . Call [facility] if: BG persistently < 70  I reviewed the Rule of 15 for the treatment of hypoglycemia in detail with the patient. Literature supplied.   2) Diabetic complications:  Eye: Does  not have known diabetic retinopathy. Pt referred to Ophthalmology  Neuro/ Feet: Does not have known diabetic peripheral neuropathy .  Renal: Patient does not have known baseline CKD. He   is  on an ACEI/ARB at present.       F/U in 4 months     Signed electronically by: Mack Guise, MD  [facility]  [address] [place], [place] [address] Phone: [phone] FAX: [phone]   CC: Vernie Shanks, [place] [address] Phone: [phone]  Fax: [phone]  Return to Endocrinology clinic as below: Future Appointments  Date Time Provider [place]  07/22/2021  4:45 PM Christella Hartigan, RD [facility]  08/13/2021  3:00 PM Topanga Alvelo, Melanie Crazier, MD [facility] None  02/12/2022  3:30 PM Cantwell, Gerline Legacy, PA-C [facility] None

## 2021-07-22 ENCOUNTER — Ambulatory Visit: Payer: BC Managed Care – PPO | Admitting: Registered"

## 2021-07-22 DIAGNOSIS — E1165 Type 2 diabetes mellitus with hyperglycemia: Secondary | ICD-10-CM | POA: Diagnosis not present

## 2021-07-22 DIAGNOSIS — E785 Hyperlipidemia, unspecified: Secondary | ICD-10-CM | POA: Diagnosis not present

## 2021-07-22 DIAGNOSIS — I2510 Atherosclerotic heart disease of native coronary artery without angina pectoris: Secondary | ICD-10-CM | POA: Diagnosis not present

## 2021-07-22 DIAGNOSIS — Z951 Presence of aortocoronary bypass graft: Secondary | ICD-10-CM | POA: Diagnosis not present

## 2021-07-24 DIAGNOSIS — Z951 Presence of aortocoronary bypass graft: Secondary | ICD-10-CM | POA: Diagnosis not present

## 2021-07-24 DIAGNOSIS — E1165 Type 2 diabetes mellitus with hyperglycemia: Secondary | ICD-10-CM | POA: Diagnosis not present

## 2021-07-24 DIAGNOSIS — E785 Hyperlipidemia, unspecified: Secondary | ICD-10-CM | POA: Diagnosis not present

## 2021-07-31 DIAGNOSIS — H52203 Unspecified astigmatism, bilateral: Secondary | ICD-10-CM | POA: Diagnosis not present

## 2021-07-31 DIAGNOSIS — E119 Type 2 diabetes mellitus without complications: Secondary | ICD-10-CM | POA: Diagnosis not present

## 2021-08-13 ENCOUNTER — Encounter: Payer: Self-pay | Admitting: Internal Medicine

## 2021-08-13 ENCOUNTER — Ambulatory Visit: Payer: BC Managed Care – PPO | Admitting: Internal Medicine

## 2021-08-13 VITALS — BP 122/80 | HR 72 | Ht 66.0 in | Wt 179.0 lb

## 2021-08-13 DIAGNOSIS — E1165 Type 2 diabetes mellitus with hyperglycemia: Secondary | ICD-10-CM

## 2021-08-13 DIAGNOSIS — E1159 Type 2 diabetes mellitus with other circulatory complications: Secondary | ICD-10-CM

## 2021-08-13 DIAGNOSIS — R739 Hyperglycemia, unspecified: Secondary | ICD-10-CM

## 2021-08-13 LAB — POCT GLUCOSE (DEVICE FOR HOME USE): Glucose Fasting, POC: 130 mg/dL — AB (ref 70–99)

## 2021-08-13 LAB — POCT GLYCOSYLATED HEMOGLOBIN (HGB A1C): Hemoglobin A1C: 7.8 % — AB (ref 4.0–5.6)

## 2021-08-13 NOTE — Patient Instructions (Addendum)
-   Keep Up the Good Work ! ?- Continue Metformin 500 mg ,2 tablets in the morning and 2 tablets with Supper  ?- Continue Jardiance 25 mg , 1 tablet every morning  ?- Continue Glipizide 5 mg , 1 tablet daily  ? ? ? ?HOW TO TREAT LOW BLOOD SUGARS (Blood sugar LESS THAN 70 MG/DL) ?Please follow the RULE OF 15 for the treatment of hypoglycemia treatment (when your (blood sugars are less than 70 mg/dL)  ? ?STEP 1: Take 15 grams of carbohydrates when your blood sugar is low, which includes:  ?3-4 GLUCOSE TABS  OR ?3-4 OZ OF JUICE OR REGULAR SODA OR ?ONE TUBE OF GLUCOSE GEL   ? ?STEP 2: RECHECK blood sugar in 15 MINUTES ?STEP 3: If your blood sugar is still low at the 15 minute recheck --> then, go back to STEP 1 and treat AGAIN with another 15 grams of carbohydrates. ? ? ?

## 2021-08-13 NOTE — Progress Notes (Signed)
?Name: Justin Olson  ?Age/ Sex: 56 y.o., male   ?MRN/ DOB: [id number], Jan 14, 1966    ? ?PCP: Ileana Ladd, MD   ?Reason for Endocrinology Evaluation: Type 2 Diabetes Mellitus  ?Initial Endocrine Consultative Visit: 07/10/2020  ? ? ?PATIENT IDENTIFIER: [name] is a 56 y.o. male with a past medical history of T2DM and CAD. The patient has followed with Endocrinology clinic since 07/10/2020 for consultative assistance with management of his diabetes. ? ?DIABETIC HISTORY:  ?Mr. Baus was diagnosed with DM in 2017,  Bydureon- skin knots . His hemoglobin A1c has ranged from  7.9%  in 2017, peaking at 8.8 % in 2022. ? ? ? ?On his initial visit to our clinic he had an A1c of 8.8%, His Ozempic was recently increased and we opted not to change and conitnued Metformin  ? ?We switched Ozempic to Jardiance due to change supply interruptions in 12/2020 ? ?Started glipizide 05/2021 ? ?SUBJECTIVE:  ? ?During the last visit (05/13/2021): A1c of 8.9%, will continue on metformin,  Jardiance and started Glipizide  ? ? ? ?Today (08/13/2021): Mr. Parfait is here for a follow up on diabetes management.  He checks his blood sugars 1 x daily . The patient has not had hypoglycemic episodes since the last clinic visit. ? ? ?Denies nausea vomiting or diarrhea ?Continue to snack at night  ? ? ?HOME DIABETES REGIMEN:  ?Metformin 500 mg ,2 tablets in the morning and 2 tablets with Supper  ?Jardiance 25 mg daily ?Glipizide 5 mg daily  ? ? ? ? ?Statin: yes ?ACE-I/ARB: yes ?Prior Diabetic Education: no ? ? ?METER DOWNLOAD SUMMARY: Did not bring  ? ? ? ?DIABETIC COMPLICATIONS: ?Microvascular complications:  ? ?Denies: CKD, neuropathy, retinopathy ?Last Eye Exam: Completed 04/2017 ? ? ? ?Macrovascular complications:  ?CAD(S/P CABG ) ?Denies: CVA, PVD ? ? ?HISTORY:  ?Past Medical History:  ?Past Medical History:  ?Diagnosis Date  ? Coronary artery disease   ? Diabetes mellitus type 2, noninsulin dependent ([facility]) 08/2015  ? Dyslipidemia associated with type 2  diabetes mellitus ([facility]) 08/2015  ? Tobacco abuse   ? ?Past Surgical History:  ?Past Surgical History:  ?Procedure Laterality Date  ? CARDIAC CATHETERIZATION N/A 08/28/2015  ? Procedure: Left Heart Cath and Coronary Angiography;  Surgeon: Runell Gess, MD;  [facility]: [facility];  Service: Cardiovascular;  Laterality: N/A;  ? CORONARY ARTERY BYPASS GRAFT N/A 08/30/2015  ? Procedure: CORONARY ARTERY BYPASS GRAFTING (CABG) x2 using left internal mammary artery and right greater saphenous vein. ;  Surgeon: Kerin Perna, MD;  LIMA-LAD, SVG-RI  ? TEE WITHOUT CARDIOVERSION N/A 08/30/2015  ? Procedure: TRANSESOPHAGEAL ECHOCARDIOGRAM (TEE);  Surgeon: Kerin Perna, MD;  [facility];  Service: Open Heart Surgery;  Laterality: N/A;  ? ?Social History:  reports that he quit smoking about 5 years ago. His smoking use included cigarettes. He has a 67.50 pack-year smoking history. He has never used smokeless tobacco. He reports that he does not currently use alcohol. He reports that he does not use drugs. ?Family History:  ?Family History  ?Problem Relation Age of Onset  ? Coronary artery disease Father   ?     MI  ? Coronary artery disease Sister   ?     s/p CABG x5  ? Drug abuse Brother   ? Heart disease Sister   ? ? ? ?HOME MEDICATIONS: ?Allergies as of 08/13/2021   ? ?   Reactions  ? Lisinopril   ?  Stomach cramping, severe constipation  ? ?  ? ?  ?Medication List  ?  ? ?  ? Accurate as of Aug 13, 2021  4:42 PM. If you have any questions, ask your nurse or doctor.  ?  ?  ? ?  ? ?aspirin 81 MG tablet ?Take 81 mg by mouth daily. ?  ?atorvastatin 80 MG tablet ?Commonly known as: LIPITOR ?Take 1 tablet (80 mg total) by mouth daily at 6 PM. ?  ?CONTOUR BLOOD GLUCOSE SYSTEM Devi ?Inject 1 Device as directed 2 (two) times daily. ?  ?empagliflozin 25 MG Tabs tablet ?Commonly known as: Jardiance ?Take 1 tablet (25 mg total) by mouth daily before breakfast. ?  ?fexofenadine 180 MG tablet ?Commonly known as: ALLEGRA ?Take  180 mg by mouth daily. ?  ?glipiZIDE 5 MG tablet ?Commonly known as: GLUCOTROL ?Take 1 tablet (5 mg total) by mouth daily before supper. ?  ?losartan 100 MG tablet ?Commonly known as: COZAAR ?Take 1 tablet (100 mg total) by mouth every evening. ?  ?losartan 50 MG tablet ?Commonly known as: COZAAR ?Take 50 mg by mouth daily. ?  ?metFORMIN 500 MG tablet ?Commonly known as: GLUCOPHAGE ?Take 1 tablet (500 mg total) by mouth daily with breakfast. ?What changed:  ?how much to take ?when to take this ?additional instructions ?  ?metoprolol tartrate 25 MG tablet ?Commonly known as: LOPRESSOR ?Take 12.5 mg by mouth 2 (two) times daily. ?  ?nitroGLYCERIN 0.4 MG SL tablet ?Commonly known as: NITROSTAT ?Place 1 tablet (0.4 mg total) under the tongue every 5 (five) minutes as needed for up to 25 days for chest pain. ?  ?OneTouch Verio test strip ?Generic drug: glucose blood ?Use as instructed ?  ?pantoprazole 40 MG tablet ?Commonly known as: PROTONIX ?Take 40 mg by mouth daily. ?  ?sildenafil 20 MG tablet ?Commonly known as: REVATIO ?SMARTSIG:3-5 Tablet(s) By Mouth Daily ?  ? ?  ? ? ? ?OBJECTIVE:  ?// ?Vital Signs: BP 122/80 (BP Location: Left Arm, Patient Position: Sitting, Cuff Size: Small)   Pulse 72   Ht 5\' 6"  (1.676 m)   Wt 179 lb (81.2 kg)   SpO2 99%   BMI 28.89 kg/m?   ?Wt Readings from Last 3 Encounters:  ?08/13/21 179 lb (81.2 kg)  ?05/13/21 179 lb (81.2 kg)  ?02/11/21 176 lb (79.8 kg)  ? ? ? ?Exam: ?General: Pt appears well and is in NAD  ?Lungs: Clear with good BS bilat with no rales, rhonchi, or wheezes  ?Heart: RRR   ?Abdomen: Normoactive bowel sounds, soft, nontender, without masses or organomegaly palpable  ?Extremities: No pretibial edema.   ?Neuro: MS is good with appropriate affect, pt is alert and Ox3  ? ?DM foot exam: 08/13/2021 ?The skin of the feet is intact without sores or ulcerations. ?The pedal pulses are 1+ bilaterally  ?The sensation is intact to a screening 5.07, 10 gram monofilament bilaterally ?   ? ?  ?   ? ? ? ?DATA REVIEWED: ? ?Lab Results  ?Component Value Date  ? HGBA1C 7.8 (A) 08/13/2021  ? HGBA1C 8.9 (A) 05/13/2021  ? HGBA1C 7.9 (H) 08/29/2015  ? ? ?    07/24/2021 ?A1c 7.9% ?MA/Cr < 13.6 ? ? ?ASSESSMENT / PLAN / RECOMMENDATIONS:  ? ?1) Type 2 Diabetes Mellitus, with improving glycemic control, With Macrovascular  complications - Most recent A1c of  7.8%. Goal A1c < 7.0 %.   ? ? ?-A1c down from 8.9% ?-I have praised the patient improved glycemic  control ?-No changes at this time ? ?MEDICATIONS: ?-Continue Metformin 500 mg ,2 tablets in the morning and 2 tablets with Supper  ?-Continue Jardiance 25 mg , 1 tablet every morning  ?-Continue glipizide 5 mg, 1 tablet before supper ? ?EDUCATION / INSTRUCTIONS: ?BG monitoring instructions: Patient is instructed to check his blood sugars 1 times a day, fasting . ?Call [facility] if: BG persistently < 70  ?I reviewed the Rule of 15 for the treatment of hypoglycemia in detail with the patient. Literature supplied. ? ? ?2) Diabetic complications:  ?Eye: Does not have known diabetic retinopathy. Pt referred to Ophthalmology  ?Neuro/ Feet: Does not have known diabetic peripheral neuropathy .  ?Renal: Patient does not have known baseline CKD. He   is  on an ACEI/ARB at present.  ? ? ? ? ? ?F/U in 4 months   ? ? ?Signed electronically by: ?Abby Raelyn Mora, MD ? ?[facility]  ?[facility] ?[address] ?[place], [place] [address] ?Phone: [phone] ?FAX: [phone] ? ? ?CC: ?Ileana Ladd, MD ?[address] ?[place] [address] ?Phone: [phone]  ?Fax: [phone] ? ?Return to Endocrinology clinic as below: ?Future Appointments  ?Date Time Provider Department Center  ?10/04/2021 11:00 AM Carolan Shiver, RD [facility]  ?12/11/2021  2:20 PM Leveda Kendrix, Konrad Dolores, MD [facility] None  ?02/12/2022  3:30 PM Cantwell, Renne Musca, PA-C [facility] None  ?  ? ? ?

## 2021-10-04 ENCOUNTER — Ambulatory Visit: Payer: BC Managed Care – PPO | Admitting: Registered"

## 2021-11-11 ENCOUNTER — Other Ambulatory Visit: Payer: Self-pay

## 2021-11-11 MED ORDER — GLIPIZIDE 5 MG PO TABS: 5.0000 mg | ORAL_TABLET | Freq: Every day | ORAL | 0 refills | Status: DC

## 2021-11-11 MED ORDER — EMPAGLIFLOZIN 25 MG PO TABS: 25.0000 mg | ORAL_TABLET | Freq: Every day | ORAL | 0 refills | Status: DC

## 2021-11-11 MED ORDER — METFORMIN HCL 500 MG PO TABS: ORAL_TABLET | ORAL | 1 refills | Status: DC

## 2021-11-19 DIAGNOSIS — N529 Male erectile dysfunction, unspecified: Secondary | ICD-10-CM | POA: Diagnosis not present

## 2021-11-19 DIAGNOSIS — E1169 Type 2 diabetes mellitus with other specified complication: Secondary | ICD-10-CM | POA: Diagnosis not present

## 2021-11-27 ENCOUNTER — Encounter: Payer: BC Managed Care – PPO | Attending: Internal Medicine | Admitting: Registered"

## 2021-11-27 DIAGNOSIS — E1165 Type 2 diabetes mellitus with hyperglycemia: Secondary | ICD-10-CM | POA: Insufficient documentation

## 2021-11-27 NOTE — Progress Notes (Unsigned)
Diabetes Self-Management Education  Visit Type:    Appt. Start Time: 1533 Appt. End Time: 1600  11/27/2021  [name], identified by name and date of birth, is a 56 y.o. male with a diagnosis of Diabetes:  Marland Kitchen Type 2  ASSESSMENT  There were no vitals taken for this visit. There is no height or weight on file to calculate BMI.  A1c: 7.8% SMBG: FBS: 133 mg/dL Medications: Jardiance; (Ozempic discontinued) metformin 1000 bid. Glipizide 15 min before meal. Medical History: s/p CABG 5 yrs ago  Ate a late supper and FBS was 163 mg/dL 2 double cheeseburgers and unsweet tea, 2 diet Mt Dew, 1 bottle water on way to work 1 bottle on the way home  Usually eats [place] kids meals 2-3 x/week.  Cut snacks out occasionally 1/2 glass milk and 2 cookies  Individualized Plan for Diabetes Self-Management Training:   Learning Objective:  Patient will have a greater understanding of diabetes self-management. Patient education plan is to attend individual and/or group sessions per assessed needs and concerns.   There are no Patient Instructions on file for this visit.  Expected Outcomes:     Education material provided: none  If problems or questions, patient to contact team via:  Phone and MyChart  Future DSME appointment:

## 2021-11-27 NOTE — Patient Instructions (Addendum)
Start checking blood sugar before bed and sometimes 2 hrs after lunch, the goal being less than 180 mg/dL. Great job on getting a routine to make sure you take medication Continue eating vegetables daily Consider changing up your breakfast. When ordering your breakfast ask if they have whole wheat bread or take your own bread to work to go with over-easy eggs. Try [place] yogurt and you can add berries

## 2021-12-11 ENCOUNTER — Ambulatory Visit: Payer: BC Managed Care – PPO | Admitting: Internal Medicine

## 2021-12-11 ENCOUNTER — Encounter: Payer: Self-pay | Admitting: Internal Medicine

## 2021-12-11 VITALS — BP 132/70 | HR 62 | Ht 66.0 in | Wt 180.0 lb

## 2021-12-11 DIAGNOSIS — E1165 Type 2 diabetes mellitus with hyperglycemia: Secondary | ICD-10-CM | POA: Diagnosis not present

## 2021-12-11 DIAGNOSIS — E1159 Type 2 diabetes mellitus with other circulatory complications: Secondary | ICD-10-CM

## 2021-12-11 LAB — POCT GLUCOSE (DEVICE FOR HOME USE): POC Glucose: 123 mg/dl — AB (ref 70–99)

## 2021-12-11 MED ORDER — EMPAGLIFLOZIN 25 MG PO TABS
25.0000 mg | ORAL_TABLET | Freq: Every day | ORAL | 3 refills | Status: DC
Start: 2021-12-11 — End: 2022-05-14

## 2021-12-11 MED ORDER — GLIPIZIDE 5 MG PO TABS: 5.0000 mg | ORAL_TABLET | Freq: Every day | ORAL | 3 refills | Status: DC

## 2021-12-11 MED ORDER — METFORMIN HCL 500 MG PO TABS
ORAL_TABLET | ORAL | 3 refills | Status: DC
Start: 2021-12-11 — End: 2022-05-14

## 2021-12-11 NOTE — Progress Notes (Signed)
Name: Justin Olson  Age/ Sex: 56 y.o., male   MRN/ DOB: [id number], 01/16/1966     PCP: Ileana Ladd, MD (Inactive)   Reason for Endocrinology Evaluation: Type 2 Diabetes Mellitus  Initial Endocrine Consultative Visit: 07/10/2020    PATIENT IDENTIFIER: [name] is a 56 y.o. male with a past medical history of T2DM and CAD. The patient has followed with Endocrinology clinic since 07/10/2020 for consultative assistance with management of his diabetes.  DIABETIC HISTORY:  [name] was diagnosed with DM in 2017,  Bydureon- skin knots . His hemoglobin A1c has ranged from  7.9%  in 2017, peaking at 8.8 % in 2022.    On his initial visit to our clinic he had an A1c of 8.8%, His Ozempic was recently increased and we opted not to change and conitnued Metformin   We switched Ozempic to Jardiance due to change supply interruptions in 12/2020  Started glipizide 05/2021  SUBJECTIVE:   During the last visit (08/13/2021): A1c of 7.8%, will continue on metformin,  Jardiance and Glipizide     Today (12/11/2021): [name] is here for a follow up on diabetes management.  He checks his blood sugars 1 daily . The patient has not had hypoglycemic episodes since the last clinic visit.  Per pt his metformin was reduced by his PCP due to improved glycemic control but this results in worsening glycemic control  Denies nausea vomiting or diarrhea    HOME DIABETES REGIMEN:  Metformin 500 mg ,2 tablets in the morning and 2 tablets with Supper  Jardiance 25 mg daily Glipizide 5 mg daily      Statin: yes ACE-I/ARB: yes Prior Diabetic Education: no   METER DOWNLOAD SUMMARY: Did not bring     DIABETIC COMPLICATIONS: Microvascular complications:   Denies: CKD, neuropathy, retinopathy Last Eye Exam: Completed 02/2021    Macrovascular complications:  CAD(S/P CABG ) Denies: CVA, PVD   HISTORY:  Past Medical History:  Past Medical History:  Diagnosis Date   Coronary artery disease     Diabetes mellitus type 2, noninsulin dependent (HCC) 08/2015   Dyslipidemia associated with type 2 diabetes mellitus (HCC) 08/2015   Tobacco abuse    Past Surgical History:  Past Surgical History:  Procedure Laterality Date   CARDIAC CATHETERIZATION N/A 08/28/2015   Procedure: Left Heart Cath and Coronary Angiography;  Surgeon: Runell Gess, MD;  Location: [facility];  Service: Cardiovascular;  Laterality: N/A;   CORONARY ARTERY BYPASS GRAFT N/A 08/30/2015   Procedure: CORONARY ARTERY BYPASS GRAFTING (CABG) x2 using left internal mammary artery and right greater saphenous vein. ;  Surgeon: Kerin Perna, MD;  LIMA-LAD, SVG-RI   TEE WITHOUT CARDIOVERSION N/A 08/30/2015   Procedure: TRANSESOPHAGEAL ECHOCARDIOGRAM (TEE);  Surgeon: Kerin Perna, MD;  Location: [facility] OR;  Service: Open Heart Surgery;  Laterality: N/A;   Social History:  reports that he quit smoking about 6 years ago. His smoking use included cigarettes. He has a 67.50 pack-year smoking history. He has never used smokeless tobacco. He reports that he does not currently use alcohol. He reports that he does not use drugs. Family History:  Family History  Problem Relation Age of Onset   Coronary artery disease Father        MI   Coronary artery disease Sister        s/p CABG x5   Drug abuse Brother    Heart disease Sister      HOME MEDICATIONS:  Allergies as of 12/11/2021       Reactions   Lisinopril    Stomach cramping, severe constipation        Medication List        Accurate as of December 11, 2021  3:32 PM. If you have any questions, ask your nurse or doctor.          aspirin 81 MG tablet Take 81 mg by mouth daily.   atorvastatin 80 MG tablet Commonly known as: LIPITOR Take 1 tablet (80 mg total) by mouth daily at 6 PM.   CONTOUR BLOOD GLUCOSE SYSTEM Devi Inject 1 Device as directed 2 (two) times daily.   empagliflozin 25 MG Tabs tablet Commonly known as: Jardiance Take 1 tablet (25 mg  total) by mouth daily before breakfast.   fexofenadine 180 MG tablet Commonly known as: ALLEGRA Take 180 mg by mouth daily.   glipiZIDE 5 MG tablet Commonly known as: GLUCOTROL Take 1 tablet (5 mg total) by mouth daily before supper.   losartan 100 MG tablet Commonly known as: COZAAR Take 1 tablet (100 mg total) by mouth every evening.   losartan 50 MG tablet Commonly known as: COZAAR Take 50 mg by mouth daily.   metFORMIN 500 MG tablet Commonly known as: GLUCOPHAGE 2 tablets in the morning and 2 tablets in the evening   metoprolol tartrate 25 MG tablet Commonly known as: LOPRESSOR Take 12.5 mg by mouth 2 (two) times daily.   nitroGLYCERIN 0.4 MG SL tablet Commonly known as: NITROSTAT Place 1 tablet (0.4 mg total) under the tongue every 5 (five) minutes as needed for up to 25 days for chest pain.   OneTouch Verio test strip Generic drug: glucose blood Use as instructed   pantoprazole 40 MG tablet Commonly known as: PROTONIX Take 40 mg by mouth daily.   sildenafil 20 MG tablet Commonly known as: REVATIO SMARTSIG:3-5 Tablet(s) By Mouth Daily         OBJECTIVE:   Vital Signs: BP 132/70 (BP Location: Left Arm, Patient Position: Sitting, Cuff Size: Small)   Pulse 62   Ht 5\' 6"  (1.676 m)   Wt 180 lb (81.6 kg)   SpO2 95%   BMI 29.05 kg/m   Wt Readings from Last 3 Encounters:  12/11/21 180 lb (81.6 kg)  08/13/21 179 lb (81.2 kg)  05/13/21 179 lb (81.2 kg)     Exam: General: Pt appears well and is in NAD  Lungs: Clear with good BS bilat with no rales, rhonchi, or wheezes  Heart: RRR   Abdomen: Normoactive bowel sounds, soft, nontender, without masses or organomegaly palpable  Extremities: No pretibial edema.   Neuro: MS is good with appropriate affect, pt is alert and Ox3   DM foot exam: 08/13/2021 The skin of the feet is intact without sores or ulcerations. The pedal pulses are 1+ bilaterally  The sensation is intact to a screening 5.07, 10 gram  monofilament bilaterally            DATA REVIEWED:  Lab Results  Component Value Date   HGBA1C 7.8 (A) 08/13/2021   HGBA1C 8.9 (A) 05/13/2021   HGBA1C 7.9 (H) 08/29/2015        07/24/2021 A1c 7.9% MA/Cr < 13.6   11/19/2021 A1c 8.8%  BUN/Cr. 16/0.850 GFR 102 HDL 38 LDL 62   ASSESSMENT / PLAN / RECOMMENDATIONS:   1) Type 2 Diabetes Mellitus, Poorly controlled , With Macrovascular  complications - Most recent A1c of  8.8%. Goal A1c <  7.0 %.     -A1c has trended up from 7.9% to 8.8%, patient attributes this increase due to reducing metformin by 50%, according to the patient this was advised by his PCP -He is currently back on metformin 2000 mg daily -He does admit to nighttime snacks of cookies and milk but he has cut that down to once a week -No changes at this time  MEDICATIONS: -Continue Metformin 500 mg ,2 tablets in the morning and 2 tablets with Supper  -Continue Jardiance 25 mg , 1 tablet every morning  -Continue glipizide 5 mg, 1 tablet before supper  EDUCATION / INSTRUCTIONS: BG monitoring instructions: Patient is instructed to check his blood sugars 1 times a day, fasting . Call [facility] if: BG persistently < 70  I reviewed the Rule of 15 for the treatment of hypoglycemia in detail with the patient. Literature supplied.   2) Diabetic complications:  Eye: Does not have known diabetic retinopathy. Pt referred to Ophthalmology  Neuro/ Feet: Does not have known diabetic peripheral neuropathy .  Renal: Patient does not have known baseline CKD. He   is  on an ACEI/ARB at present.       F/U in 4 months     Signed electronically by: Lyndle Herrlich, MD  [facility] Endocrinology  [facility] [address] [place], [place] [address] Phone: [phone] FAX: [phone]   CC: Ileana Ladd, MD (Inactive) No address on file Phone: None  Fax: None  Return to Endocrinology clinic as below: Future  Appointments  Date Time Provider Department Center  02/03/2022  4:30 PM Carolan Shiver, RD [facility]  05/14/2022  2:00 PM Alder Murri, Konrad Dolores, MD [facility] None

## 2021-12-11 NOTE — Patient Instructions (Addendum)
-   Continue Metformin 500 mg ,2 tablets in the morning and 2 tablets with Supper  - Continue Jardiance 25 mg , 1 tablet every morning  - Continue Glipizide 5 mg , 1 tablet before supper    HOW TO TREAT LOW BLOOD SUGARS (Blood sugar LESS THAN 70 MG/DL) Please follow the RULE OF 15 for the treatment of hypoglycemia treatment (when your (blood sugars are less than 70 mg/dL)   STEP 1: Take 15 grams of carbohydrates when your blood sugar is low, which includes:  3-4 GLUCOSE TABS  OR 3-4 OZ OF JUICE OR REGULAR SODA OR ONE TUBE OF GLUCOSE GEL    STEP 2: RECHECK blood sugar in 15 MINUTES STEP 3: If your blood sugar is still low at the 15 minute recheck --> then, go back to STEP 1 and treat AGAIN with another 15 grams of carbohydrates.

## 2022-01-27 ENCOUNTER — Other Ambulatory Visit: Payer: Self-pay | Admitting: Internal Medicine

## 2022-02-03 ENCOUNTER — Ambulatory Visit: Payer: BC Managed Care – PPO | Admitting: Registered"

## 2022-02-12 ENCOUNTER — Ambulatory Visit: Payer: BC Managed Care – PPO | Admitting: Student

## 2022-02-13 ENCOUNTER — Telehealth: Payer: Self-pay

## 2022-02-13 NOTE — Patient Outreach (Signed)
  Care Coordination   Initial Visit Note   02/13/2022 Name: Justin Olson MRN: [id number] DOB: Jun 09, 1965  Justin Olson is a 56 y.o. year old male who sees Soundra Pilon, FNP for primary care. I spoke with  Justin Olson by phone today.  What matters to the patients health and wellness today?  I am doing good and my blood sugars are better.  I am eating less cookies and milk.  States he is working with the RD and his endocrinologist and does not feel he needs more help.    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          SDOH assessments and interventions completed:  Yes  SDOH Interventions Today    Flowsheet Row Most Recent Value  SDOH Interventions   Food Insecurity Interventions Intervention Not Indicated  Housing Interventions Intervention Not Indicated  Transportation Interventions Intervention Not Indicated  Utilities Interventions Intervention Not Indicated  Financial Strain Interventions Intervention Not Indicated  Physical Activity Interventions Intervention Not Indicated        Care Coordination Interventions Activated:  Yes  Care Coordination Interventions:  Yes, provided   Follow up plan: No further intervention required.   Encounter Outcome:  Pt. Visit Completed  Dudley Major RN, BSN,CCM, CDE Care Management Coordinator [facility] Care Management [phone]

## 2022-02-13 NOTE — Patient Instructions (Signed)
Visit Information  Thank you for taking time to visit with me today. Please don't hesitate to contact me if I can be of assistance to you.   Following are the goals we discussed today:   Goals Addressed             This Visit's Progress    COMPLETED: Care Coordination Activities - no follow up required       Care Coordination Interventions: Provided education to patient re: care coordination services Assessed social determinant of health barriers Reviewed medications with patient and discussed importance of medication adherence Reviewed scheduled/upcoming provider appointments including: Endocrinology 05/14/22  Reviewed to include protein with his snacks No further follow up prefers to work with Endocrinologist and RD           If you are experiencing a Mental Health or Behavioral Health Crisis or need someone to talk to, please call the Suicide and Crisis Lifeline: 988 call the [place] National Suicide Prevention Lifeline: (615)729-8953 or TTY: (716)732-0008 TTY (716)338-4216) to talk to a trained counselor call 1-800-273-TALK (toll free, 24 hour hotline) go to [facility] [address] [place] [phone]) call 911   The patient verbalized understanding of instructions, educational materials, and care plan provided today and DECLINED offer to receive copy of patient instructions, educational materials, and care plan.   No further follow up required:    Dudley Major RN, Maximiano Coss, CDE Care Management Coordinator [facility] Care Management [phone]

## 2022-02-19 ENCOUNTER — Encounter: Payer: Self-pay | Admitting: Internal Medicine

## 2022-02-19 ENCOUNTER — Ambulatory Visit: Payer: BC Managed Care – PPO | Admitting: Internal Medicine

## 2022-02-19 VITALS — BP 140/76 | HR 64 | Ht 66.0 in | Wt 181.0 lb

## 2022-02-19 DIAGNOSIS — I2510 Atherosclerotic heart disease of native coronary artery without angina pectoris: Secondary | ICD-10-CM | POA: Diagnosis not present

## 2022-02-19 DIAGNOSIS — E7800 Pure hypercholesterolemia, unspecified: Secondary | ICD-10-CM | POA: Insufficient documentation

## 2022-02-19 DIAGNOSIS — Z951 Presence of aortocoronary bypass graft: Secondary | ICD-10-CM

## 2022-02-19 NOTE — Progress Notes (Signed)
Primary Physician/Referring:  Kristen Loader, FNP  Patient ID: Justin Olson, male    DOB: 06/09/1965, 56 y.o.   MRN: [id number]  Chief Complaint  Patient presents with   CAD   Follow-up   HPI:    Justin Olson  is a 56 y.o. Caucasian male  with history of heavy tobacco use disorder quit in 2017, diabetes and hyperlipidemia. family history of premature coronary artery disease with NSTEMI on 08/27/2016 with non-ST elevation myocardial infarction. Due to ostial left main, 95% stenosis underwent emergent CABG with LIMA to LAD and SVG to ramus intermediate on 08/30/2015 by Ivin Poot, MD.    Patient is here for follow-up visit.  He has been doing very well since the last time he was here.  His blood pressure slightly elevated because he just got off work and was racing to come here.  He denies chest pain, shortness of breath, palpitations, diaphoresis, syncope, orthopnea, edema, PND, claudication. Patient continues to work without issue and has remained abstinent from tobacco.  Past Medical History:  Diagnosis Date   Coronary artery disease    Diabetes mellitus type 2, noninsulin dependent ([place]) 08/2015   Dyslipidemia associated with type 2 diabetes mellitus ([place]) 08/2015   Tobacco abuse    Past Surgical History:  Procedure Laterality Date   CARDIAC CATHETERIZATION N/A 08/28/2015   Procedure: Left Heart Cath and Coronary Angiography;  Surgeon: Lorretta Harp, MD;  Location: [place] [facility];  Service: Cardiovascular;  Laterality: N/A;   CORONARY ARTERY BYPASS GRAFT N/A 08/30/2015   Procedure: CORONARY ARTERY BYPASS GRAFTING (CABG) x2 using left internal mammary artery and right greater saphenous vein. ;  Surgeon: Ivin Poot, MD;  LIMA-LAD, SVG-RI   TEE WITHOUT CARDIOVERSION N/A 08/30/2015   Procedure: TRANSESOPHAGEAL ECHOCARDIOGRAM (TEE);  Surgeon: Ivin Poot, MD;  Location: [place];  Service: Open Heart Surgery;  Laterality: N/A;   Family History  Problem Relation Age of  Onset   Coronary artery disease Father        MI   Coronary artery disease Sister        s/p CABG x5   Drug abuse Brother    Heart disease Sister     Social History   Tobacco Use   Smoking status: Former    Packs/day: 1.50    Years: 45.00    Total pack years: 67.50    Types: Cigarettes    Quit date: 08/28/2015    Years since quitting: 6.4   Smokeless tobacco: Never   Tobacco comments:    30  Substance Use Topics   Alcohol use: Not Currently   Marital Status: Married  ROS  Review of Systems  Cardiovascular:  Negative for chest pain, dyspnea on exertion, leg swelling and palpitations.  Respiratory:  Negative for shortness of breath.   Musculoskeletal:  Positive for back pain.  Gastrointestinal:  Negative for melena.   Objective  Blood pressure (!) 140/76, pulse 64, height _0  (1.676 m), weight 181 lb (82.1 kg), SpO2 96 %.     02/19/2022    2:10 PM 12/11/2021    2:21 PM 08/13/2021    3:05 PM  Vitals with BMI  Height _1  _2  _3   Weight 181 lbs 180 lbs 179 lbs  BMI 29.23 03.12 81.18  Systolic 867 737 366  Diastolic 76 70 80  Pulse 64 62 72     Physical Exam Vitals reviewed.  HENT:     Head:  Normocephalic and atraumatic.  Cardiovascular:     Rate and Rhythm: Normal rate and regular rhythm.     Pulses: Intact distal pulses.     Heart sounds: Normal heart sounds, S1 normal and S2 normal. No murmur heard.    No gallop.     Comments: No JVD. Pulmonary:     Effort: Pulmonary effort is normal. No respiratory distress.     Breath sounds: Normal breath sounds. No wheezing, rhonchi or rales.  Musculoskeletal:     Right lower leg: No edema.     Left lower leg: No edema.  Neurological:     Mental Status: He is alert.    Laboratory examination:   External labs:  08/10/2020: A1c 7.9% BUN 15, creatinine 0.90, GFR >60, sodium 139, potassium 4.0, AST 10, ALT 10 Total cholesterol 114, HDL 33, triglycerides 159, LDL 54  Labs 05/11/2020:  A1c 8.8%.  Serum  glucose 117 mg, BUN 9, creatinine 0.78, potassium 4.5, EGFR >60 mL.  CMP otherwise normal.  Cholesterol, total 122.000 08/15/2019 HDL 34.000 08/15/2019 LDL-C 61.000 08/15/2019 Triglycerides 156.000 08/15/2019  A1C 11.900 08/15/2019  Creatinine, Serum 0.780 08/15/2019 Potassium 4.300 08/15/2019 Magnesium 2.100 MG/ 03/10/2016 ALT (SGPT) 17.000 08/15/2019  04/23/2018: A1c 6.3%.  Serum glucose 113 mg, BUN 15, creatinine 0.77, EGFR greater than 61, potassium 4.2, CMP normal.   Allergies   Allergies  Allergen Reactions   Lisinopril     Stomach cramping, severe constipation    Medications Prior to Visit:   Outpatient Medications Prior to Visit  Medication Sig Dispense Refill   aspirin 81 MG tablet Take 81 mg by mouth daily.     atorvastatin (LIPITOR) 80 MG tablet Take 1 tablet (80 mg total) by mouth daily at 6 PM. 30 tablet 1   empagliflozin (JARDIANCE) 25 MG TABS tablet Take 1 tablet (25 mg total) by mouth daily before breakfast. 90 tablet 3   fexofenadine (ALLEGRA) 180 MG tablet Take 180 mg by mouth daily.     glipiZIDE (GLUCOTROL) 5 MG tablet Take 1 tablet (5 mg total) by mouth daily before supper. 90 tablet 3   glucose blood (ONETOUCH VERIO) test strip Use as instructed 100 each 12   losartan (COZAAR) 50 MG tablet Take 50 mg by mouth daily.     metFORMIN (GLUCOPHAGE) 500 MG tablet 2 tablets in the morning and 2 tablets in the evening 360 tablet 3   metoprolol tartrate (LOPRESSOR) 25 MG tablet Take 12.5 mg by mouth 2 (two) times daily.     nitroGLYCERIN (NITROSTAT) 0.4 MG SL tablet Place 1 tablet (0.4 mg total) under the tongue every 5 (five) minutes as needed for up to 25 days for chest pain. 25 tablet 3   pantoprazole (PROTONIX) 40 MG tablet Take 40 mg by mouth daily.     sildenafil (REVATIO) 20 MG tablet SMARTSIG:3-5 Tablet(s) By Mouth Daily     Blood Glucose Monitoring Suppl (CONTOUR BLOOD GLUCOSE SYSTEM) DEVI Inject 1 Device as directed 2 (two) times daily. 1 Device 0   losartan  (COZAAR) 100 MG tablet Take 1 tablet (100 mg total) by mouth every evening. 90 tablet 3   No facility-administered medications prior to visit.   Final Medications at End of Visit    Current Meds  Medication Sig   aspirin 81 MG tablet Take 81 mg by mouth daily.   atorvastatin (LIPITOR) 80 MG tablet Take 1 tablet (80 mg total) by mouth daily at 6 PM.   empagliflozin (JARDIANCE) 25 MG TABS tablet  Take 1 tablet (25 mg total) by mouth daily before breakfast.   fexofenadine (ALLEGRA) 180 MG tablet Take 180 mg by mouth daily.   glipiZIDE (GLUCOTROL) 5 MG tablet Take 1 tablet (5 mg total) by mouth daily before supper.   glucose blood (ONETOUCH VERIO) test strip Use as instructed   losartan (COZAAR) 50 MG tablet Take 50 mg by mouth daily.   metFORMIN (GLUCOPHAGE) 500 MG tablet 2 tablets in the morning and 2 tablets in the evening   metoprolol tartrate (LOPRESSOR) 25 MG tablet Take 12.5 mg by mouth 2 (two) times daily.   nitroGLYCERIN (NITROSTAT) 0.4 MG SL tablet Place 1 tablet (0.4 mg total) under the tongue every 5 (five) minutes as needed for up to 25 days for chest pain.   pantoprazole (PROTONIX) 40 MG tablet Take 40 mg by mouth daily.   sildenafil (REVATIO) 20 MG tablet SMARTSIG:3-5 Tablet(s) By Mouth Daily   Radiology:   No results found.  Cardiac Studies:   Coronary angiogram 08/27/2016: LM ostial 95% S/P LIMA to LAD and SVG to ramus intermediate on 08/30/2015 by Ivin Poot, MD.  PCV ECHOCARDIOGRAM COMPLETE 78/67/6720 Normal LV systolic function with visual EF 55-60%. Left ventricle cavity is normal in size. Normal global wall motion. Normal diastolic filling pattern, normal LAP. Trace tricuspid  regurgitation. No evidence of pulmonary hypertension. Compared to prior study 10/17/2015: no significant change.   PCV MYOCARDIAL PERFUSION WO LEXISCAN 08/27/2020 Exercise nuclear stress test was performed using Bruce protocol. Patient reached 12 METS, and 96% of age predicted maximum  heart rate. Exercise capacity was excellent. No chest pain reported. Heart rate and hemodynamic response were normal. Stress EKG revealed no ischemic changes. Normal myocardial perfusion. Stress LVEF 61%. Low risk study.  EKG   02/11/2021: Sinus rhythm at a rate of 53 bpm.  Normal axis.  Inferior infarct old, posterior infarct old.  Compared to EKG 08/10/2020, no significant change.  08/10/2020: Normal sinus rhythm at rate of 64 bpm, normal axis, inferior infarct old, posterior infarct old.  No evidence of ischemia.  No significant change from 08/01/2018   Assessment     ICD-10-CM   1. Coronary artery disease involving native coronary artery of native heart without angina pectoris  I25.10 EKG 12-Lead    2. S/P CABG x 2  Z95.1     3. Hypercholesterolemia  E78.00       No orders of the defined types were placed in this encounter.   There are no discontinued medications.    Recommendations:   SOHAM HOLLETT  is a 56 y.o. Caucasian male  with history of heavy tobacco use disorder quit in 2017, diabetes and hyperlipidemia. family history of premature coronary artery disease with NSTEMI on 08/27/2016 with non-ST elevation myocardial infarction. Due to ostial left main, 95% stenosis underwent emergent CABG with LIMA to LAD and SVG to ramus intermediate on 08/30/2015 by Ivin Poot, MD.   Coronary artery disease involving native coronary artery of native heart without angina pectoris Continue GDMT   S/P CABG x 2 No anginal symptoms Patient working without issue No chest pain or SOB with any activity   Hypercholesterolemia Continue statin   Follow-up in 1 year, sooner if needed, for hypertension, CAD, hyperlipidemia.   Floydene Flock, DO, [facility] 02/19/2022, 3:38 PM Office: [phone]

## 2022-05-14 ENCOUNTER — Ambulatory Visit: Payer: BC Managed Care – PPO | Admitting: Internal Medicine

## 2022-05-14 ENCOUNTER — Encounter: Payer: Self-pay | Admitting: Internal Medicine

## 2022-05-14 VITALS — BP 118/72 | HR 74 | Ht 66.0 in | Wt 186.0 lb

## 2022-05-14 DIAGNOSIS — E1165 Type 2 diabetes mellitus with hyperglycemia: Secondary | ICD-10-CM

## 2022-05-14 DIAGNOSIS — E1159 Type 2 diabetes mellitus with other circulatory complications: Secondary | ICD-10-CM

## 2022-05-14 LAB — POCT GLYCOSYLATED HEMOGLOBIN (HGB A1C): Hemoglobin A1C: 9.6 % — AB (ref 4.0–5.6)

## 2022-05-14 LAB — POCT GLUCOSE (DEVICE FOR HOME USE): POC Glucose: 186 mg/dl — AB (ref 70–99)

## 2022-05-14 MED ORDER — METFORMIN HCL 500 MG PO TABS: ORAL_TABLET | ORAL | 3 refills | Status: DC

## 2022-05-14 MED ORDER — GLIPIZIDE 5 MG PO TABS: 5.0000 mg | ORAL_TABLET | Freq: Every day | ORAL | 3 refills | Status: DC

## 2022-05-14 MED ORDER — SEMAGLUTIDE(0.25 OR 0.5MG/DOS) 2 MG/3ML ~~LOC~~ SOPN: 0.5000 mg | PEN_INJECTOR | SUBCUTANEOUS | 3 refills | Status: DC

## 2022-05-14 MED ORDER — EMPAGLIFLOZIN 25 MG PO TABS: 25.0000 mg | ORAL_TABLET | Freq: Every day | ORAL | 3 refills | Status: DC

## 2022-05-14 NOTE — Patient Instructions (Signed)
-   Continue Metformin 500 mg ,2 tablets in the morning and 2 tablets with Supper  - Continue Jardiance 25 mg , 1 tablet every morning  - Take  Glipizide 5 mg , 1 tablet before Breakfast  - Start Ozempic 0.25 mg once weekly for 6 weeks than increase 0.5 mg after that     HOW TO TREAT LOW BLOOD SUGARS (Blood sugar LESS THAN 70 MG/DL) Please follow the RULE OF 15 for the treatment of hypoglycemia treatment (when your (blood sugars are less than 70 mg/dL)   STEP 1: Take 15 grams of carbohydrates when your blood sugar is low, which includes:  3-4 GLUCOSE TABS  OR 3-4 OZ OF JUICE OR REGULAR SODA OR ONE TUBE OF GLUCOSE GEL    STEP 2: RECHECK blood sugar in 15 MINUTES STEP 3: If your blood sugar is still low at the 15 minute recheck --> then, go back to STEP 1 and treat AGAIN with another 15 grams of carbohydrates.

## 2022-05-14 NOTE — Progress Notes (Signed)
Name: [name]  Age/ Sex: 57 y.o., male   MRN/ DOB: [id number], December 16, 1965     PCP: Kristen Loader, FNP   Reason for Endocrinology Evaluation: Type 2 Diabetes Mellitus  Initial Endocrine Consultative Visit: 07/10/2020    PATIENT IDENTIFIER: Justin Olson is a 57 y.o. male with a past medical history of T2DM and CAD. The patient has followed with Endocrinology clinic since 07/10/2020 for consultative assistance with management of his diabetes.  DIABETIC HISTORY:  [name] was diagnosed with DM in 2017,  Bydureon- skin knots . His hemoglobin A1c has ranged from  7.9%  in 2017, peaking at 8.8 % in 2022.    On his initial visit to our clinic he had an A1c of 8.8%, His Ozempic was recently increased and we opted not to change and conitnued Metformin   We switched Ozempic to Jardiance due to change supply interruptions in 12/2020  Started glipizide 05/2021  SUBJECTIVE:   During the last visit (12/11/2021): A1c of 8.8%    Today (05/14/2022): [name] is here for a follow up on diabetes management.  He checks his blood sugars 1 daily . The patient has not had hypoglycemic episodes since the last clinic visit.  Patient had a follow-up with cardiology 02/19/2022 Denies nausea, vomiting or diarrhea   He has been forgetting to take his glipizide at supper time    [place]:  Metformin 500 mg ,2 tablets in the morning and 2 tablets with Supper  Jardiance 25 mg daily Glipizide 5 mg before supper      Statin: yes ACE-I/ARB: yes Prior Diabetic Education: no   METER DOWNLOAD SUMMARY: Did not bring     DIABETIC COMPLICATIONS: Microvascular complications:   Denies: CKD, neuropathy, retinopathy Last Eye Exam: Completed 02/2021    Macrovascular complications:  CAD(S/P CABG ) Denies: CVA, PVD   HISTORY:  Past Medical History:  Past Medical History:  Diagnosis Date   Coronary artery disease    Diabetes mellitus type 2, noninsulin dependent ([place]) 08/2015    Dyslipidemia associated with type 2 diabetes mellitus ([place]) 08/2015   Tobacco abuse    Past Surgical History:  Past Surgical History:  Procedure Laterality Date   CARDIAC CATHETERIZATION N/A 08/28/2015   Procedure: Left Heart Cath and Coronary Angiography;  Surgeon: Lorretta Harp, MD;  Location: [facility];  Service: Cardiovascular;  Laterality: N/A;   CORONARY ARTERY BYPASS GRAFT N/A 08/30/2015   Procedure: CORONARY ARTERY BYPASS GRAFTING (CABG) x2 using left internal mammary artery and right greater saphenous vein. ;  Surgeon: Ivin Poot, MD;  LIMA-LAD, SVG-RI   TEE WITHOUT CARDIOVERSION N/A 08/30/2015   Procedure: TRANSESOPHAGEAL ECHOCARDIOGRAM (TEE);  Surgeon: Ivin Poot, MD;  Location: [facility];  Service: Open Heart Surgery;  Laterality: N/A;   Social History:  reports that he quit smoking about 6 years ago. His smoking use included cigarettes. He has a 67.50 pack-year smoking history. He has never used smokeless tobacco. He reports that he does not currently use alcohol. He reports that he does not use drugs. Family History:  Family History  Problem Relation Age of Onset   Coronary artery disease Father        MI   Coronary artery disease Sister        s/p CABG x5   Drug abuse Brother    Heart disease Sister      HOME MEDICATIONS: Allergies as of 05/14/2022       Reactions  Lisinopril    Stomach cramping, severe constipation        Medication List        Accurate as of May 14, 2022  2:17 PM. If you have any questions, ask your nurse or doctor.          aspirin 81 MG tablet Take 81 mg by mouth daily.   atorvastatin 80 MG tablet Commonly known as: LIPITOR Take 1 tablet (80 mg total) by mouth daily at 6 PM.   CONTOUR BLOOD GLUCOSE SYSTEM Devi Inject 1 Device as directed 2 (two) times daily.   empagliflozin 25 MG Tabs tablet Commonly known as: Jardiance Take 1 tablet (25 mg total) by mouth daily before breakfast.   fexofenadine 180 MG  tablet Commonly known as: ALLEGRA Take 180 mg by mouth daily.   glipiZIDE 5 MG tablet Commonly known as: GLUCOTROL Take 1 tablet (5 mg total) by mouth daily before supper.   losartan 100 MG tablet Commonly known as: COZAAR Take 1 tablet (100 mg total) by mouth every evening.   losartan 50 MG tablet Commonly known as: COZAAR Take 50 mg by mouth daily.   metFORMIN 500 MG tablet Commonly known as: GLUCOPHAGE 2 tablets in the morning and 2 tablets in the evening   metoprolol tartrate 25 MG tablet Commonly known as: LOPRESSOR Take 12.5 mg by mouth 2 (two) times daily.   nitroGLYCERIN 0.4 MG SL tablet Commonly known as: NITROSTAT Place 1 tablet (0.4 mg total) under the tongue every 5 (five) minutes as needed for up to 25 days for chest pain.   OneTouch Verio test strip Generic drug: glucose blood Use as instructed   pantoprazole 40 MG tablet Commonly known as: PROTONIX Take 40 mg by mouth daily.   sildenafil 20 MG tablet Commonly known as: REVATIO SMARTSIG:3-5 Tablet(s) By Mouth Daily         OBJECTIVE:   Vital Signs: BP 118/72 (BP Location: Left Arm, Patient Position: Sitting, Cuff Size: Small)   Pulse 74   Ht 5\' 6"  (1.676 m)   Wt 186 lb (84.4 kg)   SpO2 96%   BMI 30.02 kg/m   Wt Readings from Last 3 Encounters:  05/14/22 186 lb (84.4 kg)  02/19/22 181 lb (82.1 kg)  12/11/21 180 lb (81.6 kg)     Exam: General: Pt appears well and is in NAD  Lungs: Clear with good BS bilat with no rales, rhonchi, or wheezes  Heart: RRR   Abdomen: Normoactive bowel sounds, soft, nontender, without masses or organomegaly palpable  Extremities: No pretibial edema.   Neuro: MS is good with appropriate affect, pt is alert and Ox3   DM foot exam: 05/14/2022 The skin of the feet is intact without sores or ulcerations. The pedal pulses are 1+ bilaterally  The sensation is intact to a screening 5.07, 10 gram monofilament bilaterally      DATA REVIEWED:  Lab Results   Component Value Date   HGBA1C 9.6 (A) 05/14/2022   HGBA1C 7.8 (A) 08/13/2021   HGBA1C 8.9 (A) 05/13/2021       07/24/2021 A1c 7.9% MA/Cr < 13.6   11/19/2021 A1c 8.8%  BUN/Cr. 16/0.850 GFR 102 HDL 38 LDL 62   ASSESSMENT / PLAN / RECOMMENDATIONS:   1) Type 2 Diabetes Mellitus, Poorly controlled , With Macrovascular  complications - Most recent A1c of  9.6 %. Goal A1c < 7.0 %.    -Patient with worsening glycemic control -His wife has started healthy diet and he has  been partially following this diet specially at supper, he has been avoiding snacks at night, he does acknowledge that if he eats cookies and drinks milks at night next morning his BG's are in the 180 range -Pt has been forgetting to take his Glipizide at night, I have asked him to switch this to the morning -We also discussed restarting Ozempic, in the past we had to switch due to shortage of supplies -He was given a sample of Ozempic pen and a prescription was sent -Cautioned against GI side effects with Ozempic  MEDICATIONS: -Continue Metformin 500 mg ,2 tablets in the morning and 2 tablets with Supper  -Continue Jardiance 25 mg , 1 tablet every morning  -Take glipizide 5 mg, 1 tablet before breakfast Start Ozempic 0.25 mg weekly, increase 0.5 mg weekly after 6 weeks  EDUCATION / INSTRUCTIONS: BG monitoring instructions: Patient is instructed to check his blood sugars 1 times a day, fasting . Call [facility] if: BG persistently < 70  I reviewed the Rule of 15 for the treatment of hypoglycemia in detail with the patient. Literature supplied.   2) Diabetic complications:  Eye: Does not have known diabetic retinopathy. Pt referred to Ophthalmology  Neuro/ Feet: Does not have known diabetic peripheral neuropathy .  Renal: Patient does not have known baseline CKD. He   is  on an ACEI/ARB at present.     F/U in 4 months     Signed electronically by: Mack Guise, MD  [facility]  Endocrinology  [facility] [address] [place], [place] [address] Phone: [phone] FAX: [phone]   CC: Kristen Loader, [place] [address] Phone: [phone]  Fax: [phone]  Return to Endocrinology clinic as below: Future Appointments  Date Time Provider [place]  02/18/2023  3:00 PM Custovic, Collene Mares, DO [facility] None

## 2022-05-22 DIAGNOSIS — Z0000 Encounter for general adult medical examination without abnormal findings: Secondary | ICD-10-CM | POA: Diagnosis not present

## 2022-05-26 DIAGNOSIS — E1169 Type 2 diabetes mellitus with other specified complication: Secondary | ICD-10-CM | POA: Diagnosis not present

## 2022-05-26 DIAGNOSIS — N529 Male erectile dysfunction, unspecified: Secondary | ICD-10-CM | POA: Diagnosis not present

## 2022-05-26 DIAGNOSIS — E559 Vitamin D deficiency, unspecified: Secondary | ICD-10-CM | POA: Diagnosis not present

## 2022-05-26 DIAGNOSIS — E785 Hyperlipidemia, unspecified: Secondary | ICD-10-CM | POA: Diagnosis not present

## 2022-06-13 ENCOUNTER — Other Ambulatory Visit (HOSPITAL_COMMUNITY): Payer: Self-pay

## 2022-07-15 ENCOUNTER — Telehealth: Payer: Self-pay

## 2022-07-15 NOTE — Telephone Encounter (Signed)
Ozempic needs PA

## 2022-07-21 ENCOUNTER — Telehealth: Payer: Self-pay

## 2022-07-21 ENCOUNTER — Other Ambulatory Visit (HOSPITAL_COMMUNITY): Payer: Self-pay

## 2022-07-21 NOTE — Telephone Encounter (Signed)
Patient Advocate Encounter   Received notification from pt msgs that prior authorization is required for Ozempic  Submitted: 07/21/22 Key [id number]  Status is pending

## 2022-07-21 NOTE — Telephone Encounter (Signed)
Ozempic needs PA. I didn't route message on 9th so need asap.

## 2022-07-23 NOTE — Telephone Encounter (Signed)
Pharmacy Patient Advocate Encounter  Prior Authorization for Ozempic has been approved by [employer] [place] (ins).    Effective dates: 07/22/22 through 07/22/23

## 2022-07-24 ENCOUNTER — Other Ambulatory Visit (HOSPITAL_COMMUNITY): Payer: Self-pay

## 2022-07-24 NOTE — Telephone Encounter (Signed)
Patient advised and will contact pharmacy  

## 2022-07-24 NOTE — Telephone Encounter (Signed)
[facility] pharmacy needs for the approval for Ozempic to be sent over to them for the patient to get his medication. Justin Olson

## 2022-07-25 NOTE — Telephone Encounter (Signed)
Did you speak with pharmacy?

## 2022-07-28 NOTE — Telephone Encounter (Signed)
I did. They wanted a copy of the approval, for records. We didn't get a letter and there's not one in CMM. I gave them the dates of approval and they were supposed to fill it.

## 2022-08-05 DIAGNOSIS — E119 Type 2 diabetes mellitus without complications: Secondary | ICD-10-CM | POA: Diagnosis not present

## 2022-08-05 DIAGNOSIS — H31002 Unspecified chorioretinal scars, left eye: Secondary | ICD-10-CM | POA: Diagnosis not present

## 2022-08-05 LAB — HM DIABETES EYE EXAM

## 2022-08-06 ENCOUNTER — Encounter: Payer: Self-pay | Admitting: Internal Medicine

## 2022-09-29 ENCOUNTER — Encounter: Payer: Self-pay | Admitting: Internal Medicine

## 2022-09-29 ENCOUNTER — Ambulatory Visit: Payer: BC Managed Care – PPO | Admitting: Internal Medicine

## 2022-09-29 VITALS — BP 136/80 | HR 72 | Ht 66.0 in | Wt 179.0 lb

## 2022-09-29 DIAGNOSIS — E1159 Type 2 diabetes mellitus with other circulatory complications: Secondary | ICD-10-CM | POA: Diagnosis not present

## 2022-09-29 DIAGNOSIS — E1165 Type 2 diabetes mellitus with hyperglycemia: Secondary | ICD-10-CM | POA: Diagnosis not present

## 2022-09-29 DIAGNOSIS — Z7984 Long term (current) use of oral hypoglycemic drugs: Secondary | ICD-10-CM

## 2022-09-29 DIAGNOSIS — Z7985 Long-term (current) use of injectable non-insulin antidiabetic drugs: Secondary | ICD-10-CM

## 2022-09-29 LAB — POCT GLUCOSE (DEVICE FOR HOME USE): POC Glucose: 104 mg/dl — AB (ref 70–99)

## 2022-09-29 LAB — POCT GLYCOSYLATED HEMOGLOBIN (HGB A1C): Hemoglobin A1C: 7.8 % — AB (ref 4.0–5.6)

## 2022-09-29 MED ORDER — SEMAGLUTIDE (1 MG/DOSE) 4 MG/3ML ~~LOC~~ SOPN: 1.0000 mg | PEN_INJECTOR | SUBCUTANEOUS | 3 refills | Status: DC

## 2022-09-29 MED ORDER — METFORMIN HCL 500 MG PO TABS: ORAL_TABLET | ORAL | 3 refills | Status: DC

## 2022-09-29 MED ORDER — EMPAGLIFLOZIN 25 MG PO TABS: 25.0000 mg | ORAL_TABLET | Freq: Every day | ORAL | 3 refills | Status: DC

## 2022-09-29 MED ORDER — GLIPIZIDE 5 MG PO TABS: 2.5000 mg | ORAL_TABLET | Freq: Every day | ORAL | 3 refills | Status: DC

## 2022-09-29 NOTE — Patient Instructions (Signed)
-   Continue Metformin 500 mg ,2 tablets in the morning and 2 tablets with Supper  - Continue Jardiance 25 mg , 1 tablet every morning  - Decrease Glipizide 5 mg , Half tablet before Breakfast  - Increase  Ozempic 1mg  once weekly    HOW TO TREAT LOW BLOOD SUGARS (Blood sugar LESS THAN 70 MG/DL) Please follow the RULE OF 15 for the treatment of hypoglycemia treatment (when your (blood sugars are less than 70 mg/dL)   STEP 1: Take 15 grams of carbohydrates when your blood sugar is low, which includes:  3-4 GLUCOSE TABS  OR 3-4 OZ OF JUICE OR REGULAR SODA OR ONE TUBE OF GLUCOSE GEL    STEP 2: RECHECK blood sugar in 15 MINUTES STEP 3: If your blood sugar is still low at the 15 minute recheck --> then, go back to STEP 1 and treat AGAIN with another 15 grams of carbohydrates.

## 2022-09-29 NOTE — Progress Notes (Signed)
Name: Justin Olson  Age/ Sex: 57 y.o., male   MRN/ DOB: [id number], [birth year]     PCP: Soundra Pilon, FNP   Reason for Endocrinology Evaluation: Type 2 Diabetes Mellitus  Initial Endocrine Consultative Visit: 07/10/2020    PATIENT IDENTIFIER: [name] is a 57 y.o. male with a past medical history of T2DM and CAD. The patient has followed with Endocrinology clinic since 07/10/2020 for consultative assistance with management of his diabetes.  DIABETIC HISTORY:  Mr. Apo was diagnosed with DM in 2017,  Bydureon- skin knots . His hemoglobin A1c has ranged from  7.9%  in 2017, peaking at 8.8 % in 2022.    On his initial visit to our clinic he had an A1c of 8.8%, His Ozempic was recently increased and we opted not to change and conitnued Metformin   We switched Ozempic to Jardiance due to change supply interruptions in 12/2020  Started glipizide 05/2021  Restarted Ozempic 05/2022 with an A1c of 9.6%  SUBJECTIVE:   During the last visit (05/14/2022): A1c of 9.6%    Today (09/29/2022): Mr. Schlabach is here for a follow up on diabetes management.  He checks his blood sugars 1 daily . The patient has not had hypoglycemic episodes since the last clinic visit.  Patient had a follow-up with cardiology 02/19/2022  Denies nausea or vomiting  Denies constipation or diarrhea  He has been following a strict diet with his wife    HOME DIABETES REGIMEN:  Metformin 500 mg ,2 tablets in the morning and 2 tablets with Supper  Jardiance 25 mg daily Glipizide 5 mg daily  Ozempic 0.5 mg weekly    Statin: yes ACE-I/ARB: yes Prior Diabetic Education: no   METER DOWNLOAD SUMMARY: Did not bring     DIABETIC COMPLICATIONS: Microvascular complications:   Denies: CKD, neuropathy, retinopathy Last Eye Exam: Completed 02/2021    Macrovascular complications:  CAD(S/P CABG ) Denies: CVA, PVD   HISTORY:  Past Medical History:  Past Medical History:  Diagnosis Date   Coronary artery  disease    Diabetes mellitus type 2, noninsulin dependent ([facility]) 08/2015   Dyslipidemia associated with type 2 diabetes mellitus ([facility]) 08/2015   Tobacco abuse    Past Surgical History:  Past Surgical History:  Procedure Laterality Date   CARDIAC CATHETERIZATION N/A 08/28/2015   Procedure: Left Heart Cath and Coronary Angiography;  Surgeon: Runell Gess, MD;  [facility];  Service: Cardiovascular;  Laterality: N/A;   CORONARY ARTERY BYPASS GRAFT N/A 08/30/2015   Procedure: CORONARY ARTERY BYPASS GRAFTING (CABG) x2 using left internal mammary artery and right greater saphenous vein. ;  Surgeon: Kerin Perna, MD;  LIMA-LAD, SVG-RI   TEE WITHOUT CARDIOVERSION N/A 08/30/2015   Procedure: TRANSESOPHAGEAL ECHOCARDIOGRAM (TEE);  Surgeon: Kerin Perna, MD;  Location: [facility];  Service: Open Heart Surgery;  Laterality: N/A;   Social History:  reports that he quit smoking about 7 years ago. His smoking use included cigarettes. He has a 67.50 pack-year smoking history. He has never used smokeless tobacco. He reports that he does not currently use alcohol. He reports that he does not use drugs. Family History:  Family History  Problem Relation Age of Onset   Coronary artery disease Father        MI   Coronary artery disease Sister        s/p CABG x5   Drug abuse Brother    Heart disease Sister  HOME MEDICATIONS: Allergies as of 09/29/2022       Reactions   Lisinopril    Stomach cramping, severe constipation        Medication List        Accurate as of September 29, 2022  2:28 PM. If you have any questions, ask your nurse or doctor.          aspirin 81 MG tablet Take 81 mg by mouth daily.   atorvastatin 80 MG tablet Commonly known as: LIPITOR Take 1 tablet (80 mg total) by mouth daily at 6 PM.   CONTOUR BLOOD GLUCOSE SYSTEM Devi Inject 1 Device as directed 2 (two) times daily.   empagliflozin 25 MG Tabs tablet Commonly known as: Jardiance Take 1 tablet  (25 mg total) by mouth daily before breakfast.   fexofenadine 180 MG tablet Commonly known as: ALLEGRA Take 180 mg by mouth daily.   glipiZIDE 5 MG tablet Commonly known as: GLUCOTROL Take 1 tablet (5 mg total) by mouth daily before breakfast.   losartan 100 MG tablet Commonly known as: COZAAR Take 1 tablet (100 mg total) by mouth every evening.   losartan 50 MG tablet Commonly known as: COZAAR Take 50 mg by mouth daily.   metFORMIN 500 MG tablet Commonly known as: GLUCOPHAGE 2 tablets in the morning and 2 tablets in the evening   metoprolol tartrate 25 MG tablet Commonly known as: LOPRESSOR Take 12.5 mg by mouth 2 (two) times daily.   nitroGLYCERIN 0.4 MG SL tablet Commonly known as: NITROSTAT Place 1 tablet (0.4 mg total) under the tongue every 5 (five) minutes as needed for up to 25 days for chest pain.   OneTouch Verio test strip Generic drug: glucose blood Use as instructed   pantoprazole 40 MG tablet Commonly known as: PROTONIX Take 40 mg by mouth daily.   Semaglutide(0.25 or 0.5MG /DOS) 2 MG/3ML Sopn Inject 0.5 mg into the skin once a week.   sildenafil 20 MG tablet Commonly known as: REVATIO SMARTSIG:3-5 Tablet(s) By Mouth Daily         OBJECTIVE:   Vital Signs: BP 136/80 (BP Location: Left Arm, Patient Position: Sitting, Cuff Size: Large)   Pulse 72   Ht 5\' 6"  (1.676 m)   Wt 179 lb (81.2 kg)   SpO2 98%   BMI 28.89 kg/m   Wt Readings from Last 3 Encounters:  09/29/22 179 lb (81.2 kg)  05/14/22 186 lb (84.4 kg)  02/19/22 181 lb (82.1 kg)     Exam: General: Pt appears well and is in NAD  Lungs: Clear with good BS bilat   Heart: RRR   Abdomen: Soft, nontender  Extremities: No pretibial edema.   Neuro: MS is good with appropriate affect, pt is alert and Ox3   DM foot exam: 05/14/2022 The skin of the feet is intact without sores or ulcerations. The pedal pulses are 1+ bilaterally  The sensation is intact to a screening 5.07, 10 gram  monofilament bilaterally      DATA REVIEWED:  Lab Results  Component Value Date   HGBA1C 7.8 (A) 09/29/2022   HGBA1C 9.6 (A) 05/14/2022   HGBA1C 7.8 (A) 08/13/2021    07/24/2021 A1c 7.9% MA/Cr < 13.6   11/19/2021 A1c 8.8%  BUN/Cr. 16/0.850 GFR 102 HDL 38 LDL 62   ASSESSMENT / PLAN / RECOMMENDATIONS:   1) Type 2 Diabetes Mellitus, Sub- Optimally controlled , With Macrovascular  complications - Most recent A1c of  7.8 %. Goal A1c < 7.0 %.    -  I have praised the pt on improving glucose control  - I will increase Ozempic and reduce Glipizide preemptively to prevent hypoglycemia      MEDICATIONS: -Continue Metformin 500 mg ,2 tablets in the morning and 2 tablets with Supper  -Continue Jardiance 25 mg , 1 tablet every morning  -Decrease  glipizide 5 mg, Half a tablet before breakfast - Increase Ozempic 1 mg weekly  EDUCATION / INSTRUCTIONS: BG monitoring instructions: Patient is instructed to check his blood sugars 1 times a day, fasting . Call [place] [facility] if: BG persistently < 70  I reviewed the Rule of 15 for the treatment of hypoglycemia in detail with the patient. Literature supplied.   2) Diabetic complications:  Eye: Does not have known diabetic retinopathy. Pt referred to Ophthalmology  Neuro/ Feet: Does not have known diabetic peripheral neuropathy .  Renal: Patient does not have known baseline CKD. He   is  on an ACEI/ARB at present.     F/U in 6 months     Signed electronically by: Lyndle Herrlich, MD  [facility] Endocrinology  [facility] [address] [place] [place], [place] [address] Phone: [phone] FAX: [phone]   CC: Soundra Pilon, FNP [address] [place] [address] Phone: [phone]  Fax: [phone]  Return to Endocrinology clinic as below: Future Appointments  Date Time Provider Department Center  02/18/2023  3:00 PM Yates Decamp, MD [facility] None

## 2022-11-20 DIAGNOSIS — N529 Male erectile dysfunction, unspecified: Secondary | ICD-10-CM | POA: Diagnosis not present

## 2022-11-20 DIAGNOSIS — E1169 Type 2 diabetes mellitus with other specified complication: Secondary | ICD-10-CM | POA: Diagnosis not present

## 2022-11-20 DIAGNOSIS — E785 Hyperlipidemia, unspecified: Secondary | ICD-10-CM | POA: Diagnosis not present

## 2023-02-18 ENCOUNTER — Ambulatory Visit: Payer: Self-pay | Admitting: Cardiology

## 2023-04-17 ENCOUNTER — Ambulatory Visit: Payer: BC Managed Care – PPO | Admitting: Internal Medicine

## 2023-05-05 ENCOUNTER — Encounter: Payer: Self-pay | Admitting: Cardiology

## 2023-05-05 ENCOUNTER — Ambulatory Visit: Payer: BC Managed Care – PPO | Attending: Internal Medicine | Admitting: Cardiology

## 2023-05-05 VITALS — BP 116/68 | HR 66 | Resp 16 | Ht 66.0 in | Wt 173.8 lb

## 2023-05-05 DIAGNOSIS — E7800 Pure hypercholesterolemia, unspecified: Secondary | ICD-10-CM

## 2023-05-05 DIAGNOSIS — I10 Essential (primary) hypertension: Secondary | ICD-10-CM

## 2023-05-05 DIAGNOSIS — I2510 Atherosclerotic heart disease of native coronary artery without angina pectoris: Secondary | ICD-10-CM

## 2023-05-05 NOTE — Patient Instructions (Signed)
Medication Instructions:  Your physician recommends that you continue on your current medications as directed. Please refer to the Current Medication list given to you today.  *If you need a refill on your cardiac medications before your next appointment, please call your pharmacy*   Lab Work: none If you have labs (blood work) drawn today and your tests are completely normal, you will receive your results only by: MyChart Message (if you have MyChart) OR A paper copy in the mail If you have any lab test that is abnormal or we need to change your treatment, we will call you to review the results.   Testing/Procedures: none   Follow-Up: At [facility], you and your health needs are our priority.  As part of our continuing mission to provide you with exceptional heart care, we have created designated Provider Care Teams.  These Care Teams include your primary Cardiologist (physician) and Advanced Practice Providers (APPs -  Physician Assistants and Nurse Practitioners) who all work together to provide you with the care you need, when you need it.  We recommend signing up for the patient portal called "MyChart".  Sign up information is provided on this After Visit Summary.  MyChart is used to connect with patients for Virtual Visits (Telemedicine).  Patients are able to view lab/test results, encounter notes, upcoming appointments, etc.  Non-urgent messages can be sent to your provider as well.   To learn more about what you can do with MyChart, go to [email or web address].    Your next appointment:   12 month(s)  Provider:   Yates Decamp, MD     Other Instructions

## 2023-05-05 NOTE — Progress Notes (Signed)
Cardiology Office Note:  .   Date:  05/05/2023  ID:  Justin Olson, DOB 01-26-1966, MRN [id number] PCP: Soundra Pilon, FNP  [facility] Providers Cardiologist:  Yates Decamp, MD   History of Present Illness: .   Justin Olson is a 58 y.o. Caucasian male with history of heavy tobacco use disorder quit in 2017, diabetes and hyperlipidemia. family history of premature coronary artery disease presented with NSTEMI on 08/27/2016 and due to ostial left main, 95% stenosis underwent emergent CABG with LIMA to LAD and SVG to ramus intermediate on 08/30/2015   Discussed the use of AI scribe software for clinical note transcription with the patient, who gave verbal consent to proceed.  History of Present Illness   The patient, with coronary artery disease, hypertension, hypercholesterolemia, and diabetes, presents for a routine follow-up.  He has a history of diabetes and was previously on semaglutide (Ozempic) but was taken off it last year. His blood sugar levels increased after discontinuation, and he has since resumed the medication. His last recorded HbA1c was 7.8.  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 He works long hours, often from 6 AM to 5:30 PM, and also runs a Editor, commissioning on weekends, indicating a busy work schedule.  No chest pain, shortness of breath, or smoking.       Labs   Lab Results  Component Value Date   HGBA1C 7.8 (A) 09/29/2022    Lab Results  Component Value Date   TSH 2.224 08/29/2015    External Labs:  Labs 11/23/2022:  Serum glucose 109 mg, BUN 16, creatinine 0.72, EGFR 106 mL, potassium 4.4, LFTs normal.  Hb 14.9/HCT 45.5, platelets 390, normal indicis.  Labs 05/31/2022:  Total cholesterol 125, triglycerides 138, HDL 37, LDL 63.  Vitamin D 41.8.  Review of Systems  Cardiovascular:  Negative for chest pain, dyspnea on exertion and leg swelling.    Physical Exam:   VS:   BP 116/68 (BP Location: Right Arm, Patient Position: Sitting, Cuff Size: Normal)   Pulse 66   Resp 16   Ht 5\' 6"  (1.676 m)   Wt 173 lb 12.8 oz (78.8 kg)   SpO2 96%   BMI 28.05 kg/m    Wt Readings from Last 3 Encounters:  05/05/23 173 lb 12.8 oz (78.8 kg)  09/29/22 179 lb (81.2 kg)  05/14/22 186 lb (84.4 kg)     Physical Exam Neck:     Vascular: No carotid bruit or JVD.  Cardiovascular:     Rate and Rhythm: Normal rate and regular rhythm.     Pulses: Intact distal pulses.     Heart sounds: Normal heart sounds. No murmur heard.    No gallop.  Pulmonary:     Effort: Pulmonary effort is normal.     Breath sounds: Normal breath sounds.  Abdominal:     General: Bowel sounds are normal.     Palpations: Abdomen is soft.  Musculoskeletal:     Right lower leg: No edema.     Left lower leg: No edema.     Studies Reviewed: Marland Kitchen    Coronary angiogram 08/27/2016: LM ostial 95% S/P LIMA to LAD and SVG to ramus intermediate on 08/30/2015   ECHOCARDIOGRAM COMPLETE 08/15/2020 Normal LV systolic function with visual EF 55-60%. Left ventricle cavity is normal in size. Normal global wall motion. Normal diastolic filling pattern, normal LAP. Trace tricuspid  regurgitation. No  evidence of pulmonary hypertension. Compared to prior study 10/17/2015: no significant change.   MYOCARDIAL PERFUSION WO LEXISCAN 08/27/2020 Exercise nuclear stress test was performed using Bruce protocol. Patient reached 12 METS, and 96% of age predicted maximum heart rate. Exercise capacity was excellent. No chest pain reported. Heart rate and hemodynamic response were normal. Stress EKG revealed no ischemic changes. Normal myocardial perfusion. Stress LVEF 61%. Low risk study.  EKG:    EKG Interpretation Date/Time:  Tuesday May 05 2023 16:48:07 EST Ventricular Rate:  62 PR Interval:  170 QRS Duration:  94 QT Interval:  384 QTC Calculation: 389 R Axis:   22  Text Interpretation: EKG 05/05/2023: Normal sinus  rhythm at rate of 62 bpm, inferior infarct 4.  Cannot exclude posterior infarct old.  No evidence of ischemia.  Compared to 02/11/2021, no significant change. Confirmed by Delrae Rend [phone]) on 05/05/2023 4:53:24 PM    Medications and allergies    Allergies  Allergen Reactions   Lisinopril     Stomach cramping, severe constipation    Current Outpatient Medications:    aspirin 81 MG tablet, Take 81 mg by mouth daily., Disp: , Rfl:    atorvastatin (LIPITOR) 80 MG tablet, Take 1 tablet (80 mg total) by mouth daily at 6 PM., Disp: 30 tablet, Rfl: 1   Blood Glucose Monitoring Suppl (CONTOUR BLOOD GLUCOSE SYSTEM) DEVI, Inject 1 Device as directed 2 (two) times daily., Disp: 1 Device, Rfl: 0   empagliflozin (JARDIANCE) 25 MG TABS tablet, Take 1 tablet (25 mg total) by mouth daily before breakfast., Disp: 90 tablet, Rfl: 3   fexofenadine (ALLEGRA) 180 MG tablet, Take 180 mg by mouth daily., Disp: , Rfl:    glucose blood (ONETOUCH VERIO) test strip, Use as instructed, Disp: 100 each, Rfl: 12   losartan (COZAAR) 50 MG tablet, Take 50 mg by mouth daily., Disp: , Rfl:    metFORMIN (GLUCOPHAGE) 500 MG tablet, 2 tablets in the morning and 2 tablets in the evening, Disp: 360 tablet, Rfl: 3   metoprolol tartrate (LOPRESSOR) 25 MG tablet, Take 12.5 mg by mouth 2 (two) times daily., Disp: , Rfl:    nitroGLYCERIN (NITROSTAT) 0.4 MG SL tablet, Place 1 tablet (0.4 mg total) under the tongue every 5 (five) minutes as needed for up to 25 days for chest pain., Disp: 25 tablet, Rfl: 3   pantoprazole (PROTONIX) 40 MG tablet, Take 40 mg by mouth daily., Disp: , Rfl:    Semaglutide, 1 MG/DOSE, 4 MG/3ML SOPN, Inject 1 mg as directed once a week., Disp: 9 mL, Rfl: 3   sildenafil (REVATIO) 20 MG tablet, SMARTSIG:3-5 Tablet(s) By Mouth Daily, Disp: , Rfl:    ASSESSMENT AND PLAN: .      ICD-10-CM   1. Coronary artery disease involving native coronary artery of native heart without angina pectoris  I25.10 EKG 12-Lead     2. Hypercholesterolemia  E78.00     3. Primary hypertension  I10       Assessment and Plan    Coronary Artery Disease (CAD) Well-managed CAD. No recent nitroglycerin use, chest pain, or dyspnea. EKG unchanged. 2022 stress test and echocardiogram showed excellent exercise tolerance and normal heart function. Continuing current medications is crucial for maintaining heart health and preventing complications. - Continue aspirin 81 mg daily - Continue atorvastatin 80 mg daily - Continue metoprolol 25 mg daily (split into two doses) - Continue losartan 50 mg daily - Follow-up in one year  Hypertension Well-controlled on current regimen. Emphasized adherence  to prevent cardiovascular events. - Continue current antihypertensive medications (losartan 50 mg daily, metoprolol 25 mg daily)  Hypercholesterolemia LDL well-controlled at 63 with current medication. Maintaining this level reduces cardiovascular risk. - Continue atorvastatin 80 mg daily  Type 2 Diabetes Mellitus Requires improved management. Previously well-controlled but lapsed after discontinuing semaglutide. Currently on Ozempic with an upcoming appointment with his endocrinologist. Emphasized medication adherence and regular follow-ups for optimal glycemic control. - Continue Ozempic (semaglutide) - Follow-up with Dr. Mariana Single next month  General Health Maintenance Non-smoker. 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 Encouraged to maintain non-smoking status and active lifestyle for cardiovascular health. - Encourage continued non-smoking - Maintain active lifestyle  Follow-up - Follow-up in one year - Call if any issues arise before the next scheduled visit.   Signed,  Yates Decamp, MD, [facility] 05/05/2023, 5:02 PM [facility] [address] [place], [place] [address] Phone: [phone]. Fax:  [phone]

## 2023-05-18 ENCOUNTER — Encounter: Payer: Self-pay | Admitting: Internal Medicine

## 2023-05-18 ENCOUNTER — Ambulatory Visit: Payer: BC Managed Care – PPO | Admitting: Internal Medicine

## 2023-05-18 VITALS — BP 120/80 | HR 79 | Ht 66.0 in | Wt 174.0 lb

## 2023-05-18 DIAGNOSIS — E1159 Type 2 diabetes mellitus with other circulatory complications: Secondary | ICD-10-CM | POA: Diagnosis not present

## 2023-05-18 DIAGNOSIS — Z7984 Long term (current) use of oral hypoglycemic drugs: Secondary | ICD-10-CM

## 2023-05-18 DIAGNOSIS — E1165 Type 2 diabetes mellitus with hyperglycemia: Secondary | ICD-10-CM

## 2023-05-18 DIAGNOSIS — Z7985 Long-term (current) use of injectable non-insulin antidiabetic drugs: Secondary | ICD-10-CM | POA: Diagnosis not present

## 2023-05-18 LAB — POCT GLYCOSYLATED HEMOGLOBIN (HGB A1C): Hemoglobin A1C: 8 % — AB (ref 4.0–5.6)

## 2023-05-18 LAB — POCT GLUCOSE (DEVICE FOR HOME USE): Glucose Fasting, POC: 137 mg/dL — AB (ref 70–99)

## 2023-05-18 MED ORDER — GLIPIZIDE 5 MG PO TABS: 5.0000 mg | ORAL_TABLET | Freq: Every day | ORAL | 3 refills | Status: DC

## 2023-05-18 MED ORDER — EMPAGLIFLOZIN 25 MG PO TABS: 25.0000 mg | ORAL_TABLET | Freq: Every day | ORAL | 3 refills | Status: DC

## 2023-05-18 MED ORDER — METFORMIN HCL 500 MG PO TABS: ORAL_TABLET | ORAL | 3 refills | Status: DC

## 2023-05-18 MED ORDER — SEMAGLUTIDE (1 MG/DOSE) 4 MG/3ML ~~LOC~~ SOPN: 1.0000 mg | PEN_INJECTOR | SUBCUTANEOUS | 3 refills | Status: DC

## 2023-05-18 NOTE — Progress Notes (Signed)
 Name: Justin Olson  Age/ Sex: 58 y.o., male   MRN/ DOB: [id number], [birth year]     PCP: Alejandro Hurt, FNP   Reason for Endocrinology Evaluation: Type 2 Diabetes Mellitus  Initial Endocrine Consultative Visit: 07/10/2020    PATIENT IDENTIFIER: [name] is a 58 y.o. male with a past medical history of T2DM and CAD. The patient has followed with Endocrinology clinic since 07/10/2020 for consultative assistance with management of his diabetes.  DIABETIC HISTORY:  Justin Olson was diagnosed with DM in 2017,  Bydureon- skin knots . His hemoglobin A1c has ranged from  7.9%  in 2017, peaking at 8.8 % in 2022.    On his initial visit to our clinic he had an A1c of 8.8%, His Ozempic  was recently increased and we opted not to change and conitnued Metformin    We switched Ozempic  to Jardiance  due to change supply interruptions in 12/2020  Started glipizide  05/2021  Restarted Ozempic  05/2022 with an A1c of 9.6%  SUBJECTIVE:   During the last visit (09/29/2022): A1c of 7.8%    Today (05/18/2023): [name] is here for a follow up on diabetes management.  He checks his blood sugars 1 x occasionally  . The patient has not had hypoglycemic episodes since the last clinic visit.  Patient follows with cardiology for CAD, dyslipidemia, and HTN 04/2023  Denies nausea or vomiting  Denies constipation or diarrhea    HOME DIABETES REGIMEN:  Metformin  500 mg ,2 tablets in the morning and 2 tablets with Supper  Jardiance  25 mg daily Glipizide  5 mg , half tab daily - not taking  Ozempic  1 mg weekly    Statin: yes ACE-I/ARB: yes Prior Diabetic Education: no   METER DOWNLOAD SUMMARY: Did not bring     DIABETIC COMPLICATIONS: Microvascular complications:   Denies: CKD, neuropathy, retinopathy Last Eye Exam: Completed 08/05/2022    Macrovascular complications:  CAD(S/P CABG ) Denies: CVA, PVD   HISTORY:  Past Medical History:  Past Medical History:  Diagnosis Date   Coronary artery  disease    Diabetes mellitus type 2, noninsulin dependent (HCC) 08/2015   Dyslipidemia associated with type 2 diabetes mellitus (HCC) 08/2015   Tobacco abuse    Past Surgical History:  Past Surgical History:  Procedure Laterality Date   CARDIAC CATHETERIZATION N/A 08/28/2015   Procedure: Left Heart Cath and Coronary Angiography;  Surgeon: Avanell Leigh, MD;  Location: [facility] INVASIVE CV LAB;  Service: Cardiovascular;  Laterality: N/A;   CORONARY ARTERY BYPASS GRAFT N/A 08/30/2015   Procedure: CORONARY ARTERY BYPASS GRAFTING (CABG) x2 using left internal mammary artery and right greater saphenous vein. ;  Surgeon: Heriberto London, MD;  LIMA-LAD, SVG-RI   TEE WITHOUT CARDIOVERSION N/A 08/30/2015   Procedure: TRANSESOPHAGEAL ECHOCARDIOGRAM (TEE);  Surgeon: Heriberto London, MD;  [facility];  Service: Open Heart Surgery;  Laterality: N/A;   Social History:  reports that he quit smoking about 7 years ago. His smoking use included cigarettes. He started smoking about 52 years ago. He has a 67.5 pack-year smoking history. He has never used smokeless tobacco. He reports that he does not currently use alcohol. He reports that he does not use drugs. Family History:  Family History  Problem Relation Age of Onset   Coronary artery disease Father        MI   Coronary artery disease Sister        s/p CABG x5   Drug abuse Brother  Heart disease Sister      HOME MEDICATIONS: Allergies as of 05/18/2023       Reactions   Lisinopril     Stomach cramping, severe constipation        Medication List        Accurate as of May 18, 2023  9:13 AM. If you have any questions, ask your nurse or doctor.          aspirin  81 MG tablet Take 81 mg by mouth daily.   atorvastatin  80 MG tablet Commonly known as: LIPITOR  Take 1 tablet (80 mg total) by mouth daily at 6 PM.   Cholecalciferol 50 MCG (2000 UT) Caps 1 capsule Orally Once a day   CONTOUR BLOOD GLUCOSE SYSTEM Devi Inject 1 Device  as directed 2 (two) times daily.   empagliflozin  25 MG Tabs tablet Commonly known as: Jardiance  Take 1 tablet (25 mg total) by mouth daily before breakfast.   fexofenadine 180 MG tablet Commonly known as: ALLEGRA Take 180 mg by mouth daily.   losartan  50 MG tablet Commonly known as: COZAAR  Take 50 mg by mouth daily.   metFORMIN  500 MG tablet Commonly known as: GLUCOPHAGE  2 tablets in the morning and 2 tablets in the evening   metoprolol  tartrate 25 MG tablet Commonly known as: LOPRESSOR  Take 12.5 mg by mouth 2 (two) times daily.   nitroGLYCERIN  0.4 MG SL tablet Commonly known as: NITROSTAT  Place 1 tablet (0.4 mg total) under the tongue every 5 (five) minutes as needed for up to 25 days for chest pain.   OneTouch Verio test strip Generic drug: glucose blood Use as instructed   pantoprazole  40 MG tablet Commonly known as: PROTONIX  Take 40 mg by mouth daily.   Semaglutide  (1 MG/DOSE) 4 MG/3ML Sopn Inject 1 mg as directed once a week.   sildenafil 20 MG tablet Commonly known as: REVATIO SMARTSIG:3-5 Tablet(s) By Mouth Daily         OBJECTIVE:   Vital Signs: BP 120/80 (BP Location: Left Arm, Patient Position: Sitting, Cuff Size: Small)   Pulse 79   Ht 5\' 6"  (1.676 m)   Wt 174 lb (78.9 kg)   SpO2 97%   BMI 28.08 kg/m   Wt Readings from Last 3 Encounters:  05/18/23 174 lb (78.9 kg)  05/05/23 173 lb 12.8 oz (78.8 kg)  09/29/22 179 lb (81.2 kg)     Exam: General: Pt appears well and is in NAD  Lungs: Clear with good BS bilat   Heart: RRR   Abdomen: Soft, nontender  Extremities: No pretibial edema.   Neuro: MS is good with appropriate affect, pt is alert and Ox3   DM foot exam: 05/18/2023 The skin of the feet is intact without sores or ulcerations. The pedal pulses are 1+ bilaterally  The sensation is intact to a screening 5.07, 10 gram monofilament bilaterally      DATA REVIEWED:  Lab Results  Component Value Date   HGBA1C 8.0 (A) 05/18/2023    HGBA1C 7.8 (A) 09/29/2022   HGBA1C 9.6 (A) 05/14/2022      In office BG 137 mg/dL   ASSESSMENT / PLAN / RECOMMENDATIONS:   1) Type 2 Diabetes Mellitus, Poorly controlled , With Macrovascular  complications - Most recent A1c of  8.0 %. Goal A1c < 7.0 %.    -Patient has been noted with worsening glycemic control - He has not been taking half a tablet of glipizide , will restart with a full tablet as below  MEDICATIONS: -  Continue Metformin  500 mg ,2 tablets in the morning and 2 tablets with Supper  -Continue Jardiance  25 mg , 1 tablet every morning  - Restart   glipizide  5 mg, 1 tablet before breakfast - Continue  Ozempic  1 mg weekly  EDUCATION / INSTRUCTIONS: BG monitoring instructions: Patient is instructed to check his blood sugars 1 times a day, fasting . Call [facility] if: BG persistently < 70  I reviewed the Rule of 15 for the treatment of hypoglycemia in detail with the patient. Literature supplied.   2) Diabetic complications:  Eye: Does not have known diabetic retinopathy. Pt referred to Ophthalmology  Neuro/ Feet: Does not have known diabetic peripheral neuropathy .  Renal: Patient does not have known baseline CKD. He   is  on an ACEI/ARB at present.     F/U in 3 months     Signed electronically by: Natale Bail, MD  [facility]  [facility] [address] [place] [place], [place] [address] Phone: [phone] FAX: [phone]   CC: Alejandro Hurt, FNP [address] [place] [address] Phone: [phone]  Fax: [phone]  Return to Endocrinology clinic as below: No future appointments.

## 2023-05-18 NOTE — Patient Instructions (Addendum)
-   Continue Metformin  500 mg ,2 tablets in the morning and 2 tablets with Supper  - Continue Jardiance  25 mg , 1 tablet every morning  - Continue Ozempic  1mg  once weekly  - Restart Glipizide  5 mg, 1 tablet before Breakfast    HOW TO TREAT LOW BLOOD SUGARS (Blood sugar LESS THAN 70 MG/DL) Please follow the RULE OF 15 for the treatment of hypoglycemia treatment (when your (blood sugars are less than 70 mg/dL)   STEP 1: Take 15 grams of carbohydrates when your blood sugar is low, which includes:  3-4 GLUCOSE TABS  OR 3-4 OZ OF JUICE OR REGULAR SODA OR ONE TUBE OF GLUCOSE GEL    STEP 2: RECHECK blood sugar in 15 MINUTES STEP 3: If your blood sugar is still low at the 15 minute recheck --> then, go back to STEP 1 and treat AGAIN with another 15 grams of carbohydrates.

## 2023-06-12 DIAGNOSIS — I2510 Atherosclerotic heart disease of native coronary artery without angina pectoris: Secondary | ICD-10-CM | POA: Diagnosis not present

## 2023-06-12 DIAGNOSIS — J302 Other seasonal allergic rhinitis: Secondary | ICD-10-CM | POA: Diagnosis not present

## 2023-06-12 DIAGNOSIS — I10 Essential (primary) hypertension: Secondary | ICD-10-CM | POA: Diagnosis not present

## 2023-06-12 DIAGNOSIS — K219 Gastro-esophageal reflux disease without esophagitis: Secondary | ICD-10-CM | POA: Diagnosis not present

## 2023-06-12 DIAGNOSIS — Z0000 Encounter for general adult medical examination without abnormal findings: Secondary | ICD-10-CM | POA: Diagnosis not present

## 2023-06-12 DIAGNOSIS — E559 Vitamin D deficiency, unspecified: Secondary | ICD-10-CM | POA: Diagnosis not present

## 2023-06-12 DIAGNOSIS — E1169 Type 2 diabetes mellitus with other specified complication: Secondary | ICD-10-CM | POA: Diagnosis not present

## 2023-06-12 DIAGNOSIS — Z125 Encounter for screening for malignant neoplasm of prostate: Secondary | ICD-10-CM | POA: Diagnosis not present

## 2023-06-12 DIAGNOSIS — E785 Hyperlipidemia, unspecified: Secondary | ICD-10-CM | POA: Diagnosis not present

## 2023-06-19 ENCOUNTER — Ambulatory Visit: Payer: BC Managed Care – PPO | Admitting: Internal Medicine

## 2023-07-13 ENCOUNTER — Other Ambulatory Visit (HOSPITAL_COMMUNITY): Payer: Self-pay

## 2023-07-13 ENCOUNTER — Telehealth: Payer: Self-pay | Admitting: Pharmacy Technician

## 2023-07-13 NOTE — Telephone Encounter (Signed)
 Pharmacy Patient Advocate Encounter   Received notification from  that prior authorization for Ozempic (0.25 or 0.5 MG/DOSE) 2MG /3ML pen-injectors is due for rCoverMyMedsenewal.   Insurance verification completed.   The patient is insured through [facility].  Action: Medication has been discontinued. Archived Key: Justin Olson

## 2023-07-14 DIAGNOSIS — Z01818 Encounter for other preprocedural examination: Secondary | ICD-10-CM | POA: Diagnosis not present

## 2023-08-05 ENCOUNTER — Telehealth: Payer: Self-pay

## 2023-08-05 NOTE — Telephone Encounter (Signed)
 Pharmacy Patient Advocate Encounter   Received notification from CoverMyMeds that prior authorization for Ozempic  is required/requested.   Insurance verification completed.   The patient is insured through [facility] [employer] .   Per test claim: PA required; PA submitted to above mentioned insurance via CoverMyMeds Key/confirmation #/EOC [facility] Status is pending

## 2023-08-11 NOTE — Telephone Encounter (Signed)
 Pharmacy Patient Advocate Encounter  Received notification from [facility] that Prior Authorization for Ozempic  has been APPROVED from 08/05/23 to 08/04/24

## 2023-08-17 ENCOUNTER — Ambulatory Visit: Payer: BC Managed Care – PPO | Admitting: Internal Medicine

## 2023-08-17 ENCOUNTER — Encounter: Payer: Self-pay | Admitting: Internal Medicine

## 2023-08-17 VITALS — BP 124/70 | HR 61 | Ht 66.0 in | Wt 171.0 lb

## 2023-08-17 DIAGNOSIS — Z7984 Long term (current) use of oral hypoglycemic drugs: Secondary | ICD-10-CM | POA: Diagnosis not present

## 2023-08-17 DIAGNOSIS — Z7985 Long-term (current) use of injectable non-insulin antidiabetic drugs: Secondary | ICD-10-CM | POA: Diagnosis not present

## 2023-08-17 DIAGNOSIS — E1165 Type 2 diabetes mellitus with hyperglycemia: Secondary | ICD-10-CM

## 2023-08-17 DIAGNOSIS — E1159 Type 2 diabetes mellitus with other circulatory complications: Secondary | ICD-10-CM

## 2023-08-17 LAB — POCT GLUCOSE (DEVICE FOR HOME USE): Glucose Fasting, POC: 136 mg/dL — AB (ref 70–99)

## 2023-08-17 LAB — POCT GLYCOSYLATED HEMOGLOBIN (HGB A1C): Hemoglobin A1C: 7.1 % — AB (ref 4.0–5.6)

## 2023-08-17 MED ORDER — EMPAGLIFLOZIN 25 MG PO TABS: 25.0000 mg | ORAL_TABLET | Freq: Every day | ORAL | 3 refills | Status: DC

## 2023-08-17 MED ORDER — GLIPIZIDE 5 MG PO TABS: 5.0000 mg | ORAL_TABLET | Freq: Every day | ORAL | 3 refills | Status: DC

## 2023-08-17 MED ORDER — SEMAGLUTIDE (1 MG/DOSE) 4 MG/3ML ~~LOC~~ SOPN: 1.0000 mg | PEN_INJECTOR | SUBCUTANEOUS | 3 refills | Status: DC

## 2023-08-17 MED ORDER — METFORMIN HCL 500 MG PO TABS: ORAL_TABLET | ORAL | 3 refills | Status: DC

## 2023-08-17 NOTE — Progress Notes (Signed)
 Name: Justin Olson  Age/ Sex: 58 y.o., male   MRN/ DOB: [id number], [birth year]     PCP: Alejandro Hurt, FNP   Reason for Endocrinology Evaluation: Type 2 Diabetes Mellitus  Initial Endocrine Consultative Visit: 07/10/2020    PATIENT IDENTIFIER: Justin Olson is a 58 y.o. male with a past medical history of T2DM and CAD. The patient has followed with Endocrinology clinic since 07/10/2020 for consultative assistance with management of his diabetes.  DIABETIC HISTORY:  Justin Olson was diagnosed with DM in 2017,  Bydureon- skin knots . His hemoglobin A1c has ranged from  7.9%  in 2017, peaking at 8.8 % in 2022.    On his initial visit to our clinic he had an A1c of 8.8%, His Ozempic  was recently increased and we opted not to change and conitnued Metformin    We switched Ozempic  to Jardiance  due to change supply interruptions in 12/2020  Started glipizide  05/2021  Restarted Ozempic  05/2022 with an A1c of 9.6%  SUBJECTIVE:   During the last visit (09/29/2022): A1c of 7.8%    Today (08/17/2023): Justin Olson is here for a follow up on diabetes management.  He checks his blood sugars 1 x occasionally  . The patient has not had hypoglycemic episodes since the last clinic visit.  Patient follows with cardiology for CAD, dyslipidemia, and HTN 04/2023  Denies nausea or vomiting  Denies constipation or diarrhea  Denies genital infection  Scheduled  for  colonoscopy next Friday   HOME DIABETES REGIMEN:  Metformin  500 mg ,2 tablets in the morning and 2 tablets with Supper  Jardiance  25 mg daily Glipizide  5 mg , 1 tab before breakfast  Ozempic  1 mg weekly   Statin: yes ACE-I/ARB: yes Prior Diabetic Education: no      METER DOWNLOAD SUMMARY: Did not bring     DIABETIC COMPLICATIONS: Microvascular complications:   Denies: CKD, neuropathy, retinopathy Last Eye Exam: Completed 08/05/2022    Macrovascular complications:  CAD(S/P CABG ) Denies: CVA, PVD   HISTORY:  Past Medical  History:  Past Medical History:  Diagnosis Date   Coronary artery disease    Diabetes mellitus type 2, noninsulin dependent (HCC) 08/2015   Dyslipidemia associated with type 2 diabetes mellitus (HCC) 08/2015   Tobacco abuse    Past Surgical History:  Past Surgical History:  Procedure Laterality Date   CARDIAC CATHETERIZATION N/A 08/28/2015   Procedure: Left Heart Cath and Coronary Angiography;  Surgeon: Avanell Leigh, MD;  Location: [facility];  Service: Cardiovascular;  Laterality: N/A;   CORONARY ARTERY BYPASS GRAFT N/A 08/30/2015   Procedure: CORONARY ARTERY BYPASS GRAFTING (CABG) x2 using left internal mammary artery and right greater saphenous vein. ;  Surgeon: Heriberto London, MD;  LIMA-LAD, SVG-RI   TEE WITHOUT CARDIOVERSION N/A 08/30/2015   Procedure: TRANSESOPHAGEAL ECHOCARDIOGRAM (TEE);  Surgeon: Heriberto London, MD;  Location: [facility] OR;  Service: Open Heart Surgery;  Laterality: N/A;   Social History:  reports that he quit smoking about 7 years ago. His smoking use included cigarettes. He started smoking about 53 years ago. He has a 67.5 pack-year smoking history. He has never used smokeless tobacco. He reports that he does not currently use alcohol. He reports that he does not use drugs. Family History:  Family History  Problem Relation Age of Onset   Coronary artery disease Father        MI   Coronary artery disease Sister  s/p CABG x5   Drug abuse Brother    Heart disease Sister      HOME MEDICATIONS: Allergies as of 08/17/2023       Reactions   Lisinopril     Stomach cramping, severe constipation        Medication List        Accurate as of Aug 17, 2023  8:54 AM. If you have any questions, ask your nurse or doctor.          aspirin  81 MG tablet Take 81 mg by mouth daily.   atorvastatin  80 MG tablet Commonly known as: LIPITOR  Take 1 tablet (80 mg total) by mouth daily at 6 PM.   Cholecalciferol 50 MCG (2000 UT) Caps 1 capsule Orally  Once a day   CONTOUR BLOOD GLUCOSE SYSTEM Devi Inject 1 Device as directed 2 (two) times daily.   empagliflozin  25 MG Tabs tablet Commonly known as: Jardiance  Take 1 tablet (25 mg total) by mouth daily before breakfast.   Eroxon Gel apply Externally as needed   fexofenadine 180 MG tablet Commonly known as: ALLEGRA Take 180 mg by mouth daily.   glipiZIDE  5 MG tablet Commonly known as: GLUCOTROL  Take 1 tablet (5 mg total) by mouth daily before breakfast.   losartan  50 MG tablet Commonly known as: COZAAR  Take 50 mg by mouth daily.   metFORMIN  500 MG tablet Commonly known as: GLUCOPHAGE  2 tablets in the morning and 2 tablets in the evening   metoprolol  tartrate 25 MG tablet Commonly known as: LOPRESSOR  Take 12.5 mg by mouth 2 (two) times daily.   nitroGLYCERIN  0.4 MG SL tablet Commonly known as: NITROSTAT  Place 1 tablet (0.4 mg total) under the tongue every 5 (five) minutes as needed for up to 25 days for chest pain.   OneTouch Verio test strip Generic drug: glucose blood Use as instructed   pantoprazole  40 MG tablet Commonly known as: PROTONIX  Take 40 mg by mouth daily.   Semaglutide  (1 MG/DOSE) 4 MG/3ML Sopn Inject 1 mg as directed once a week.   sildenafil 20 MG tablet Commonly known as: REVATIO SMARTSIG:3-5 Tablet(s) By Mouth Daily         OBJECTIVE:   Vital Signs: BP 124/70 (BP Location: Left Arm, Patient Position: Sitting, Cuff Size: Normal)   Pulse 61   Ht 5\' 6"  (1.676 m)   Wt 171 lb (77.6 kg)   SpO2 96%   BMI 27.60 kg/m   Wt Readings from Last 3 Encounters:  08/17/23 171 lb (77.6 kg)  05/18/23 174 lb (78.9 kg)  05/05/23 173 lb 12.8 oz (78.8 kg)     Exam: General: Pt appears well and is in NAD  Lungs: Clear with good BS bilat   Heart: RRR   Abdomen: Soft, nontender  Extremities: No pretibial edema.   Neuro: MS is good with appropriate affect, pt is alert and Ox3   DM foot exam: 05/18/2023 The skin of the feet is intact without sores  or ulcerations. The pedal pulses are 1+ bilaterally  The sensation is intact to a screening 5.07, 10 gram monofilament bilaterally      DATA REVIEWED:  Lab Results  Component Value Date   HGBA1C 8.0 (A) 05/18/2023   HGBA1C 7.8 (A) 09/29/2022   HGBA1C 9.6 (A) 05/14/2022    In office BG 136 mg/dL   ASSESSMENT / PLAN / RECOMMENDATIONS:   1) Type 2 Diabetes Mellitus, Sub-Optimally controlled , With Macrovascular  complications - Most recent A1c of  7.1 %.  Goal A1c < 7.0 %.    -A1c is trending down -I did receive a letter from his health insurance indicating inconsistent pickup of medications, patient assures me compliance - No changes at this time - Patient is scheduled for colonoscopy in 2 weeks, he was advised to hold Ozempic  a week prior to colonoscopy and to hold his oral glycemic agents the day prior and the day of colonoscopy to prevent hypoglycemia.  MEDICATIONS: -Continue Metformin  500 mg ,2 tablets in the morning and 2 tablets with Supper  -Continue Jardiance  25 mg , 1 tablet every morning  -Continue Glipizide  5 mg, 1 tablet before breakfast -Continue  Ozempic  1 mg weekly  EDUCATION / INSTRUCTIONS: BG monitoring instructions: Patient is instructed to check his blood sugars 1 times a day, fasting . Call [facility] if: BG persistently < 70  I reviewed the Rule of 15 for the treatment of hypoglycemia in detail with the patient. Literature supplied.   2) Diabetic complications:  Eye: Does not have known diabetic retinopathy. Pt referred to Ophthalmology  Neuro/ Feet: Does not have known diabetic peripheral neuropathy .  Renal: Patient does not have known baseline CKD. He   is  on an ACEI/ARB at present.     F/U in 4 months     Signed electronically by: Natale Bail, MD  [facility] Endocrinology  [facility] [address] [place] [place], [place] [address] Phone: [phone] FAX: [phone]   CC: Alejandro Hurt,  FNP [address] [place] [address] Phone: [phone]  Fax: [phone]  Return to Endocrinology clinic as below: No future appointments.

## 2023-08-17 NOTE — Patient Instructions (Addendum)
 Do NOT take  Ozempic  on Sunday 5/18th, resume after colonoscopy Do NOT take Metformin , Jardiance  or Glipizide  on Thursday 5/22nd or Friday 5/23rd, resume Saturday 5/24     - Continue Metformin  500 mg ,2 tablets in the morning and 2 tablets with Supper  - Continue Jardiance  25 mg , 1 tablet every morning  - Continue Ozempic  1mg  once weekly  - Continue Glipizide  5 mg, 1 tablet before Breakfast      HOW TO TREAT LOW BLOOD SUGARS (Blood sugar LESS THAN 70 MG/DL) Please follow the RULE OF 15 for the treatment of hypoglycemia treatment (when your (blood sugars are less than 70 mg/dL)   STEP 1: Take 15 grams of carbohydrates when your blood sugar is low, which includes:  3-4 GLUCOSE TABS  OR 3-4 OZ OF JUICE OR REGULAR SODA OR ONE TUBE OF GLUCOSE GEL    STEP 2: RECHECK blood sugar in 15 MINUTES STEP 3: If your blood sugar is still low at the 15 minute recheck --> then, go back to STEP 1 and treat AGAIN with another 15 grams of carbohydrates.

## 2023-10-05 LAB — HM DIABETES EYE EXAM

## 2023-12-01 DIAGNOSIS — K219 Gastro-esophageal reflux disease without esophagitis: Secondary | ICD-10-CM | POA: Diagnosis not present

## 2023-12-01 DIAGNOSIS — I2510 Atherosclerotic heart disease of native coronary artery without angina pectoris: Secondary | ICD-10-CM | POA: Diagnosis not present

## 2023-12-01 DIAGNOSIS — I10 Essential (primary) hypertension: Secondary | ICD-10-CM | POA: Diagnosis not present

## 2023-12-01 DIAGNOSIS — E1169 Type 2 diabetes mellitus with other specified complication: Secondary | ICD-10-CM | POA: Diagnosis not present

## 2023-12-01 DIAGNOSIS — E785 Hyperlipidemia, unspecified: Secondary | ICD-10-CM | POA: Diagnosis not present

## 2023-12-21 ENCOUNTER — Encounter: Payer: Self-pay | Admitting: Internal Medicine

## 2023-12-21 ENCOUNTER — Ambulatory Visit: Admitting: Internal Medicine

## 2023-12-21 VITALS — BP 120/84 | HR 68 | Ht 66.0 in | Wt 175.0 lb

## 2023-12-21 DIAGNOSIS — Z7984 Long term (current) use of oral hypoglycemic drugs: Secondary | ICD-10-CM

## 2023-12-21 DIAGNOSIS — E1165 Type 2 diabetes mellitus with hyperglycemia: Secondary | ICD-10-CM

## 2023-12-21 DIAGNOSIS — E119 Type 2 diabetes mellitus without complications: Secondary | ICD-10-CM | POA: Diagnosis not present

## 2023-12-21 DIAGNOSIS — E1159 Type 2 diabetes mellitus with other circulatory complications: Secondary | ICD-10-CM

## 2023-12-21 LAB — POCT GLUCOSE (DEVICE FOR HOME USE): Glucose Fasting, POC: 134 mg/dL — AB (ref 70–99)

## 2023-12-21 MED ORDER — METFORMIN HCL 500 MG PO TABS: 1000.0000 mg | ORAL_TABLET | Freq: Two times a day (BID) | ORAL | 3 refills | Status: DC

## 2023-12-21 MED ORDER — SEMAGLUTIDE (2 MG/DOSE) 8 MG/3ML ~~LOC~~ SOPN: 2.0000 mg | PEN_INJECTOR | SUBCUTANEOUS | 3 refills | Status: DC

## 2023-12-21 MED ORDER — EMPAGLIFLOZIN 25 MG PO TABS: 25.0000 mg | ORAL_TABLET | Freq: Every day | ORAL | 3 refills | Status: DC

## 2023-12-21 MED ORDER — GLIPIZIDE 5 MG PO TABS: 5.0000 mg | ORAL_TABLET | Freq: Every day | ORAL | 3 refills | Status: DC

## 2023-12-21 NOTE — Progress Notes (Signed)
 Name: Justin Olson  Age/ Sex: 58 y.o., male   MRN/ DOB: [id number], [birth year]     PCP: Marvene Prentice SAUNDERS, FNP   Reason for Endocrinology Evaluation: Type 2 Diabetes Mellitus  Initial Endocrine Consultative Visit: 07/10/2020    PATIENT IDENTIFIER: Justin Olson is a 58 y.o. male with a past medical history of T2DM and CAD. The patient has followed with Endocrinology clinic since 07/10/2020 for consultative assistance with management of his diabetes.  DIABETIC HISTORY:  Justin Olson was diagnosed with DM in 2017,  Bydureon- skin knots . His hemoglobin A1c has ranged from  7.9%  in 2017, peaking at 8.8 % in 2022.    On his initial visit to our clinic he had an A1c of 8.8%, His Ozempic  was recently increased and we opted not to change and conitnued Metformin    We switched Ozempic  to Jardiance  due to change supply interruptions in 12/2020  Started glipizide  05/2021  Restarted Ozempic  05/2022 with an A1c of 9.6%  SUBJECTIVE:   During the last visit (08/17/2023): A1c of 7.1%    Today (12/21/2023): Justin Olson is here for a follow up on diabetes management.  He checks his blood sugars 1 x occasionally  . The patient has not had hypoglycemic episodes since the last clinic visit.  Patient follows with cardiology for CAD, dyslipidemia, and HTN 04/2023 Weight stable  He did have a right eye bruising , no vision  Continues to have milk and cookies at night  No nausea or vomiting  No constipation or diarrhea   HOME DIABETES REGIMEN:  Metformin  500 mg ,2 tablets in the morning and 2 tablets with Supper  Jardiance  25 mg daily Glipizide  5 mg , 1 tab before breakfast  Ozempic  1 mg weekly   Statin: yes ACE-I/ARB: yes Prior Diabetic Education: no      METER DOWNLOAD SUMMARY: Did not bring     DIABETIC COMPLICATIONS: Microvascular complications:   Denies: CKD, neuropathy, retinopathy Last Eye Exam: Completed 10/05/2023    Macrovascular complications:  CAD(S/P CABG ) Denies: CVA,  PVD   HISTORY:  Past Medical History:  Past Medical History:  Diagnosis Date   Coronary artery disease    Diabetes mellitus type 2, noninsulin dependent (HCC) 08/2015   Dyslipidemia associated with type 2 diabetes mellitus (HCC) 08/2015   Tobacco abuse    Past Surgical History:  Past Surgical History:  Procedure Laterality Date   CARDIAC CATHETERIZATION N/A 08/28/2015   Procedure: Left Heart Cath and Coronary Angiography;  Surgeon: Dorn JINNY Lesches, MD;  Location: [facility];  Service: Cardiovascular;  Laterality: N/A;   CORONARY ARTERY BYPASS GRAFT N/A 08/30/2015   Procedure: CORONARY ARTERY BYPASS GRAFTING (CABG) x2 using left internal mammary artery and right greater saphenous vein. ;  Surgeon: Maude Fleeta Ochoa, MD;  LIMA-LAD, SVG-RI   TEE WITHOUT CARDIOVERSION N/A 08/30/2015   Procedure: TRANSESOPHAGEAL ECHOCARDIOGRAM (TEE);  Surgeon: Maude Fleeta Ochoa, MD;  Location: [facility] OR;  Service: Open Heart Surgery;  Laterality: N/A;   Social History:  reports that he quit smoking about 8 years ago. His smoking use included cigarettes. He started smoking about 53 years ago. He has a 67.5 pack-year smoking history. He has never used smokeless tobacco. He reports that he does not currently use alcohol. He reports that he does not use drugs. Family History:  Family History  Problem Relation Age of Onset   Coronary artery disease Father        MI   Coronary  artery disease Sister        s/p CABG x5   Drug abuse Brother    Heart disease Sister      HOME MEDICATIONS: Allergies as of 12/21/2023       Reactions   Lisinopril     Stomach cramping, severe constipation        Medication List        Accurate as of December 21, 2023  8:15 AM. If you have any questions, ask your nurse or doctor.          STOP taking these medications    Semaglutide  (1 MG/DOSE) 4 MG/3ML Sopn Replaced by: Semaglutide  (2 MG/DOSE) 8 MG/3ML Sopn Stopped by: Donell PARAS Annjeanette Sarwar       TAKE these  medications    aspirin  81 MG tablet Take 81 mg by mouth daily.   atorvastatin  80 MG tablet Commonly known as: LIPITOR  Take 1 tablet (80 mg total) by mouth daily at 6 PM.   Cholecalciferol 50 MCG (2000 UT) Caps 1 capsule Orally Once a day   CONTOUR BLOOD GLUCOSE SYSTEM Devi Inject 1 Device as directed 2 (two) times daily.   empagliflozin  25 MG Tabs tablet Commonly known as: Jardiance  Take 1 tablet (25 mg total) by mouth daily before breakfast.   Eroxon Gel apply Externally as needed   fexofenadine 180 MG tablet Commonly known as: ALLEGRA Take 180 mg by mouth daily.   glipiZIDE  5 MG tablet Commonly known as: GLUCOTROL  Take 1 tablet (5 mg total) by mouth daily before breakfast.   losartan  50 MG tablet Commonly known as: COZAAR  Take 50 mg by mouth daily.   metFORMIN  500 MG tablet Commonly known as: GLUCOPHAGE  Take 2 tablets (1,000 mg total) by mouth 2 (two) times daily with a meal. What changed:  how much to take how to take this when to take this additional instructions Changed by: Zair Borawski J Ayah Cozzolino   metoprolol  tartrate 25 MG tablet Commonly known as: LOPRESSOR  Take 12.5 mg by mouth 2 (two) times daily.   nitroGLYCERIN  0.4 MG SL tablet Commonly known as: NITROSTAT  Place 1 tablet (0.4 mg total) under the tongue every 5 (five) minutes as needed for up to 25 days for chest pain.   OneTouch Verio test strip Generic drug: glucose blood Use as instructed   pantoprazole  40 MG tablet Commonly known as: PROTONIX  Take 40 mg by mouth daily.   Semaglutide  (2 MG/DOSE) 8 MG/3ML Sopn Inject 2 mg as directed once a week. Replaces: Semaglutide  (1 MG/DOSE) 4 MG/3ML Sopn Started by: Donell PARAS Toretto Tingler   sildenafil 20 MG tablet Commonly known as: REVATIO SMARTSIG:3-5 Tablet(s) By Mouth Daily         OBJECTIVE:   Vital Signs: BP 120/84 (BP Location: Left Arm, Patient Position: Sitting, Cuff Size: Normal)   Pulse 68   Ht 5' 6 (1.676 m)   Wt 175 lb (79.4  kg)   SpO2 98%   BMI 28.25 kg/m   Wt Readings from Last 3 Encounters:  12/21/23 175 lb (79.4 kg)  08/17/23 171 lb (77.6 kg)  05/18/23 174 lb (78.9 kg)     Exam: General: Pt appears well and is in NAD  Lungs: Clear with good BS bilat   Heart: RRR   Abdomen: Soft, nontender  Extremities: No pretibial edema.   Neuro: MS is good with appropriate affect, pt is alert and Ox3   DM foot exam: 12/21/2023 The skin of the feet is intact without sores or ulcerations. The pedal pulses  are 1+ bilaterally  The sensation is intact to a screening 5.07, 10 gram monofilament bilaterally     DATA REVIEWED:  Lab Results  Component Value Date   HGBA1C 7.1 (A) 08/17/2023   HGBA1C 8.0 (A) 05/18/2023   HGBA1C 7.8 (A) 09/29/2022    In office BG 134 mg/dL     1/73/7974 Through PCPs office BUN 17 Creatinine 0.9 GFR 94 A1c 7.9   ASSESSMENT / PLAN / RECOMMENDATIONS:   1) Type 2 Diabetes Mellitus, Sub-Optimally controlled , With Macrovascular  complications - Most recent A1c of  7.9%. Goal A1c < 7.0 %.    -A1c has trended , patient attributes this to dietary indiscretion while on vacation - We have discussed increasing Ozempic  since since tolerating it well  MEDICATIONS: -Continue Metformin  500 mg ,2 tablets in the morning and 2 tablets with Supper  -Continue Jardiance  25 mg , 1 tablet every morning  -Continue Glipizide  5 mg, 1 tablet before breakfast - Increase Ozempic  2 mg weekly  EDUCATION / INSTRUCTIONS: BG monitoring instructions: Patient is instructed to check his blood sugars 1 times a day, fasting . Call [facility] if: BG persistently < 70  I reviewed the Rule of 15 for the treatment of hypoglycemia in detail with the patient. Literature supplied.   2) Diabetic complications:  Eye: Does not have known diabetic retinopathy. Pt referred to Ophthalmology  Neuro/ Feet: Does not have known diabetic peripheral neuropathy .  Renal: Patient does not have known  baseline CKD. He   is  on an ACEI/ARB at present.     F/U in 4 months     Signed electronically by: Stefano Redgie Butts, MD  [facility]  [facility] [address] [place] [place], [place] [address] Phone: [phone] FAX: [phone]   CC: Marvene Prentice SAUNDERS, FNP [address] [place] [address] Phone: [phone]  Fax: [phone]  Return to Endocrinology clinic as below: No future appointments.

## 2023-12-21 NOTE — Patient Instructions (Addendum)
-   Continue Metformin  500 mg ,2 tablets in the morning and 2 tablets with Supper  - Continue Jardiance  25 mg , 1 tablet every morning  - Continue Glipizide  5 mg, 1 tablet before Breakfast  - Increase Ozempic  2 mg weekly     HOW TO TREAT LOW BLOOD SUGARS (Blood sugar LESS THAN 70 MG/DL) Please follow the RULE OF 15 for the treatment of hypoglycemia treatment (when your (blood sugars are less than 70 mg/dL)   STEP 1: Take 15 grams of carbohydrates when your blood sugar is low, which includes:  3-4 GLUCOSE TABS  OR 3-4 OZ OF JUICE OR REGULAR SODA OR ONE TUBE OF GLUCOSE GEL    STEP 2: RECHECK blood sugar in 15 MINUTES STEP 3: If your blood sugar is still low at the 15 minute recheck --> then, go back to STEP 1 and treat AGAIN with another 15 grams of carbohydrates.

## 2024-03-17 ENCOUNTER — Encounter: Payer: Self-pay | Admitting: Cardiology

## 2024-04-21 NOTE — Progress Notes (Deleted)
 " Virtual Visit via Video Note  I connected with Justin Olson on 04/21/24 at  7:30 AM EST by a video enabled telemedicine application and verified that I am speaking with the correct person using two identifiers.   I discussed the limitations of evaluation and management by telemedicine and the availability of in person appointments. The patient expressed understanding and agreed to proceed.   -Location of the patient : -Location of the provider : Office -The names of all persons participating in the telemedicine service : Pt and myself       Name: Justin Olson  Age/ Sex: 59 y.o., male   MRN/ DOB: [id number], 1966/02/19     PCP: Marvene Prentice SAUNDERS, FNP   Reason for Endocrinology Evaluation: Type 2 Diabetes Mellitus  Initial Endocrine Consultative Visit: 07/10/2020    PATIENT IDENTIFIER: [name] is a 59 y.o. male with a past medical history of T2DM and CAD. The patient has followed with Endocrinology clinic since 07/10/2020 for consultative assistance with management of his diabetes.  DIABETIC HISTORY:  Justin Olson was diagnosed with DM in 2017,  Bydureon- skin knots . His hemoglobin A1c has ranged from  7.9%  in 2017, peaking at 8.8 % in 2022.    On his initial visit to our clinic he had an A1c of 8.8%, His Ozempic  was recently increased and we opted not to change and conitnued Metformin    We switched Ozempic  to Jardiance  due to change supply interruptions in 12/2020  Started glipizide  05/2021  Restarted Ozempic  05/2022 with an A1c of 9.6%  SUBJECTIVE:   During the last visit (12/21/2023): A1c of 7.9%    Today (04/21/2024): Justin Olson is here for a follow up on diabetes management.  He checks his blood sugars 1 x occasionally  . The patient has not had hypoglycemic episodes since the last clinic visit.  Patient follows with cardiology for CAD, dyslipidemia, and HTN 04/2023  Continues to have milk and cookies at night  No nausea or vomiting  No constipation or diarrhea   HOME  DIABETES REGIMEN:  Metformin  500 mg ,2 tablets in the morning and 2 tablets with Supper  Jardiance  25 mg daily Glipizide  5 mg , 1 tab before breakfast  Ozempic  2 mg weekly   Statin: yes ACE-I/ARB: yes Prior Diabetic Education: no    METER DOWNLOAD SUMMARY: Did not bring     DIABETIC COMPLICATIONS: Microvascular complications:   Denies: CKD, neuropathy, retinopathy Last Eye Exam: Completed 10/05/2023    Macrovascular complications:  CAD(S/P CABG ) Denies: CVA, PVD   HISTORY:  Past Medical History:  Past Medical History:  Diagnosis Date   Coronary artery disease    Diabetes mellitus type 2, noninsulin dependent (HCC) 08/2015   Dyslipidemia associated with type 2 diabetes mellitus (HCC) 08/2015   Tobacco abuse    Past Surgical History:  Past Surgical History:  Procedure Laterality Date   CARDIAC CATHETERIZATION N/A 08/28/2015   Procedure: Left Heart Cath and Coronary Angiography;  Surgeon: Dorn JINNY Lesches, MD;  [facility]: [facility];  Service: Cardiovascular;  Laterality: N/A;   CORONARY ARTERY BYPASS GRAFT N/A 08/30/2015   Procedure: CORONARY ARTERY BYPASS GRAFTING (CABG) x2 using left internal mammary artery and right greater saphenous vein. ;  Surgeon: Maude Fleeta Ochoa, MD;  LIMA-LAD, SVG-RI   TEE WITHOUT CARDIOVERSION N/A 08/30/2015   Procedure: TRANSESOPHAGEAL ECHOCARDIOGRAM (TEE);  Surgeon: Maude Fleeta Ochoa, MD;  Location: [facility] OR;  Service: Open Heart Surgery;  Laterality: N/A;  Social History:  reports that he quit smoking about 8 years ago. His smoking use included cigarettes. He started smoking about 53 years ago. He has a 67.5 pack-year smoking history. He has never used smokeless tobacco. He reports that he does not currently use alcohol. He reports that he does not use drugs. Family History:  Family History  Problem Relation Age of Onset   Coronary artery disease Father        MI   Coronary artery disease Sister        s/p CABG x5   Drug abuse  Brother    Heart disease Sister      HOME MEDICATIONS: Allergies as of 04/22/2024       Reactions   Lisinopril     Stomach cramping, severe constipation        Medication List        Accurate as of April 21, 2024  2:11 PM. If you have any questions, ask your nurse or doctor.          aspirin  81 MG tablet Take 81 mg by mouth daily.   atorvastatin  80 MG tablet Commonly known as: LIPITOR  Take 1 tablet (80 mg total) by mouth daily at 6 PM.   Cholecalciferol 50 MCG (2000 UT) Caps 1 capsule Orally Once a day   CONTOUR BLOOD GLUCOSE SYSTEM Devi Inject 1 Device as directed 2 (two) times daily.   empagliflozin  25 MG Tabs tablet Commonly known as: Jardiance  Take 1 tablet (25 mg total) by mouth daily before breakfast.   Eroxon Gel apply Externally as needed   fexofenadine 180 MG tablet Commonly known as: ALLEGRA Take 180 mg by mouth daily.   glipiZIDE  5 MG tablet Commonly known as: GLUCOTROL  Take 1 tablet (5 mg total) by mouth daily before breakfast.   losartan  50 MG tablet Commonly known as: COZAAR  Take 50 mg by mouth daily.   metFORMIN  500 MG tablet Commonly known as: GLUCOPHAGE  Take 2 tablets (1,000 mg total) by mouth 2 (two) times daily with a meal.   metoprolol  tartrate 25 MG tablet Commonly known as: LOPRESSOR  Take 12.5 mg by mouth 2 (two) times daily.   nitroGLYCERIN  0.4 MG SL tablet Commonly known as: NITROSTAT  Place 1 tablet (0.4 mg total) under the tongue every 5 (five) minutes as needed for up to 25 days for chest pain.   OneTouch Verio test strip Generic drug: glucose blood Use as instructed   pantoprazole  40 MG tablet Commonly known as: PROTONIX  Take 40 mg by mouth daily.   Semaglutide  (2 MG/DOSE) 8 MG/3ML Sopn Inject 2 mg as directed once a week.   sildenafil 20 MG tablet Commonly known as: REVATIO SMARTSIG:3-5 Tablet(s) By Mouth Daily         OBJECTIVE:   Vital Signs: There were no vitals taken for this visit.  Wt  Readings from Last 3 Encounters:  12/21/23 175 lb (79.4 kg)  08/17/23 171 lb (77.6 kg)  05/18/23 174 lb (78.9 kg)     Exam: General: Pt appears well and is in NAD  Lungs: Clear with good BS bilat   Heart: RRR   Abdomen: Soft, nontender  Extremities: No pretibial edema.   Neuro: MS is good with appropriate affect, pt is alert and Ox3   DM foot exam: 12/21/2023 The skin of the feet is intact without sores or ulcerations. The pedal pulses are 1+ bilaterally  The sensation is intact to a screening 5.07, 10 gram monofilament bilaterally     DATA REVIEWED:  Lab Results  Component Value Date   HGBA1C 7.1 (A) 08/17/2023   HGBA1C 8.0 (A) 05/18/2023   HGBA1C 7.8 (A) 09/29/2022      12/01/2023 Through PCPs office BUN 17 Creatinine 0.9 GFR 94 A1c 7.9   ASSESSMENT / PLAN / RECOMMENDATIONS:   1) Type 2 Diabetes Mellitus, Sub-Optimally controlled , With Macrovascular  complications - Most recent A1c of  7.9%. Goal A1c < 7.0 %.    -A1c has trended , patient attributes this to dietary indiscretion while on vacation - We have discussed increasing Ozempic  since since tolerating it well  MEDICATIONS: -Continue Metformin  500 mg ,2 tablets in the morning and 2 tablets with Supper  -Continue Jardiance  25 mg , 1 tablet every morning  -Continue Glipizide  5 mg, 1 tablet before breakfast - Increase Ozempic  2 mg weekly  EDUCATION / INSTRUCTIONS: BG monitoring instructions: Patient is instructed to check his blood sugars 1 times a day, fasting . Call  Endocrinology clinic if: BG persistently < 70  I reviewed the Rule of 15 for the treatment of hypoglycemia in detail with the patient. Literature supplied.   2) Diabetic complications:  Eye: Does not have known diabetic retinopathy. Pt referred to Ophthalmology  Neuro/ Feet: Does not have known diabetic peripheral neuropathy .  Renal: Patient does not have known baseline CKD. He   is  on an ACEI/ARB at present.     F/U in 4  months     Signed electronically by: Stefano Redgie Butts, MD  [facility] Endocrinology  [facility] [address] [place], [place] [address] Phone: [phone] FAX: [phone]   CC: Marvene Prentice SAUNDERS, FNP [address] [place] [address] Phone: [phone]  Fax: [phone]  Return to Endocrinology clinic as below: Future Appointments  Date Time Provider Department Center  04/22/2024  7:30 AM Tanganika Barradas, Donell Redgie, MD [facility] None  05/23/2024  8:00 AM Ladona Heinz, MD [facility]       "

## 2024-04-22 ENCOUNTER — Ambulatory Visit: Admitting: Internal Medicine

## 2024-04-22 ENCOUNTER — Encounter: Admitting: Internal Medicine

## 2024-04-22 ENCOUNTER — Telehealth: Admitting: Internal Medicine

## 2024-04-22 ENCOUNTER — Encounter: Payer: Self-pay | Admitting: Internal Medicine

## 2024-04-25 ENCOUNTER — Other Ambulatory Visit

## 2024-04-25 ENCOUNTER — Ambulatory Visit: Admitting: Internal Medicine

## 2024-04-25 ENCOUNTER — Encounter: Payer: Self-pay | Admitting: Internal Medicine

## 2024-04-25 VITALS — BP 116/80 | Ht 66.0 in | Wt 173.0 lb

## 2024-04-25 DIAGNOSIS — Z7984 Long term (current) use of oral hypoglycemic drugs: Secondary | ICD-10-CM

## 2024-04-25 DIAGNOSIS — Z7985 Long-term (current) use of injectable non-insulin antidiabetic drugs: Secondary | ICD-10-CM | POA: Diagnosis not present

## 2024-04-25 DIAGNOSIS — E1159 Type 2 diabetes mellitus with other circulatory complications: Secondary | ICD-10-CM | POA: Diagnosis not present

## 2024-04-25 DIAGNOSIS — E1165 Type 2 diabetes mellitus with hyperglycemia: Secondary | ICD-10-CM

## 2024-04-25 LAB — POCT GLYCOSYLATED HEMOGLOBIN (HGB A1C): Hemoglobin A1C: 7.1 % — AB (ref 4.0–5.6)

## 2024-04-25 MED ORDER — EMPAGLIFLOZIN 25 MG PO TABS: 25.0000 mg | ORAL_TABLET | Freq: Every day | ORAL | 3 refills | Status: AC

## 2024-04-25 MED ORDER — GLIPIZIDE 5 MG PO TABS: 5.0000 mg | ORAL_TABLET | Freq: Every day | ORAL | 3 refills | Status: AC

## 2024-04-25 MED ORDER — SEMAGLUTIDE (2 MG/DOSE) 8 MG/3ML ~~LOC~~ SOPN: 2.0000 mg | PEN_INJECTOR | SUBCUTANEOUS | 3 refills | Status: AC

## 2024-04-25 MED ORDER — METFORMIN HCL 500 MG PO TABS: 1000.0000 mg | ORAL_TABLET | Freq: Two times a day (BID) | ORAL | 3 refills | Status: AC

## 2024-04-25 NOTE — Patient Instructions (Signed)
" °-   Continue Metformin  500 mg ,2 tablets in the morning and 2 tablets with Supper  - Continue Jardiance  25 mg , 1 tablet every morning  - Continue Glipizide  5 mg, 1 tablet before Breakfast  - Continue  Ozempic  2 mg weekly     HOW TO TREAT LOW BLOOD SUGARS (Blood sugar LESS THAN 70 MG/DL) Please follow the RULE OF 15 for the treatment of hypoglycemia treatment (when your (blood sugars are less than 70 mg/dL)   STEP 1: Take 15 grams of carbohydrates when your blood sugar is low, which includes:  3-4 GLUCOSE TABS  OR 3-4 OZ OF JUICE OR REGULAR SODA OR ONE TUBE OF GLUCOSE GEL    STEP 2: RECHECK blood sugar in 15 MINUTES STEP 3: If your blood sugar is still low at the 15 minute recheck --> then, go back to STEP 1 and treat AGAIN with another 15 grams of carbohydrates. "

## 2024-04-25 NOTE — Progress Notes (Signed)
 error

## 2024-04-25 NOTE — Progress Notes (Signed)
 "    Name: Justin Olson  Age/ Sex: 59 y.o., male   MRN/ DOB: [id number], Sep 20, 1965     PCP: Marvene Prentice SAUNDERS, FNP   Reason for Endocrinology Evaluation: Type 2 Diabetes Mellitus  Initial Endocrine Consultative Visit: 07/10/2020    PATIENT IDENTIFIER: [name] is a 59 y.o. male with a past medical history of T2DM and CAD. The patient has followed with Endocrinology clinic since 07/10/2020 for consultative assistance with management of his diabetes.  DIABETIC HISTORY:  Mr. Rapozo was diagnosed with DM in 2017,  Bydureon- skin knots . His hemoglobin A1c has ranged from  7.9%  in 2017, peaking at 8.8 % in 2022.    On his initial visit to our clinic he had an A1c of 8.8%, His Ozempic  was recently increased and we opted not to change and conitnued Metformin    We switched Ozempic  to Jardiance  due to change supply interruptions in 12/2020  Started glipizide  05/2021  Restarted Ozempic  05/2022 with an A1c of 9.6%  SUBJECTIVE:   During the last visit (12/21/2023): A1c of 7.9%    Today (04/25/2024): [name] is here for a follow up on diabetes management.  He checks his blood sugars 1 x occasionally  . The patient has not had hypoglycemic episodes since the last clinic visit.  Patient follows with cardiology for CAD, dyslipidemia, and HTN 04/2023 He continues with cookies and milk at night  No nausea  No constipation or diarrhea    HOME DIABETES REGIMEN:  Metformin  500 mg ,2 tablets in the morning and 2 tablets with Supper  Jardiance  25 mg daily Glipizide  5 mg , 1 tab before breakfast  Ozempic  2 mg weekly   Statin: yes ACE-I/ARB: yes Prior Diabetic Education: no    METER DOWNLOAD SUMMARY: Did not bring   Memory recall 130-160 mg/dL   DIABETIC COMPLICATIONS: Microvascular complications:   Denies: CKD, neuropathy, retinopathy Last Eye Exam: Completed 10/05/2023    Macrovascular complications:  CAD(S/P CABG ) Denies: CVA, PVD   HISTORY:  Past Medical History:  Past  Medical History:  Diagnosis Date   Coronary artery disease    Diabetes mellitus type 2, noninsulin dependent (HCC) 08/2015   Dyslipidemia associated with type 2 diabetes mellitus (HCC) 08/2015   Tobacco abuse    Past Surgical History:  Past Surgical History:  Procedure Laterality Date   CARDIAC CATHETERIZATION N/A 08/28/2015   Procedure: Left Heart Cath and Coronary Angiography;  Surgeon: Dorn JINNY Lesches, MD;  Location: [facility];  Service: Cardiovascular;  Laterality: N/A;   CORONARY ARTERY BYPASS GRAFT N/A 08/30/2015   Procedure: CORONARY ARTERY BYPASS GRAFTING (CABG) x2 using left internal mammary artery and right greater saphenous vein. ;  Surgeon: Maude Fleeta Ochoa, MD;  LIMA-LAD, SVG-RI   TEE WITHOUT CARDIOVERSION N/A 08/30/2015   Procedure: TRANSESOPHAGEAL ECHOCARDIOGRAM (TEE);  Surgeon: Maude Fleeta Ochoa, MD;  Location: [facility] OR;  Service: Open Heart Surgery;  Laterality: N/A;   Social History:  reports that he quit smoking about 8 years ago. His smoking use included cigarettes. He started smoking about 53 years ago. He has a 67.5 pack-year smoking history. He has never used smokeless tobacco. He reports that he does not currently use alcohol. He reports that he does not use drugs. Family History:  Family History  Problem Relation Age of Onset   Coronary artery disease Father        MI   Coronary artery disease Sister  s/p CABG x5   Drug abuse Brother    Heart disease Sister      HOME MEDICATIONS: Allergies as of 04/25/2024       Reactions   Lisinopril     Stomach cramping, severe constipation        Medication List        Accurate as of April 25, 2024  7:02 AM. If you have any questions, ask your nurse or doctor.          aspirin  81 MG tablet Take 81 mg by mouth daily.   atorvastatin  80 MG tablet Commonly known as: LIPITOR  Take 1 tablet (80 mg total) by mouth daily at 6 PM.   CONTOUR BLOOD GLUCOSE SYSTEM Devi Inject 1 Device as directed 2 (two)  times daily.   empagliflozin  25 MG Tabs tablet Commonly known as: Jardiance  Take 1 tablet (25 mg total) by mouth daily before breakfast.   fexofenadine 180 MG tablet Commonly known as: ALLEGRA Take 180 mg by mouth daily.   glipiZIDE  5 MG tablet Commonly known as: GLUCOTROL  Take 1 tablet (5 mg total) by mouth daily before breakfast.   losartan  50 MG tablet Commonly known as: COZAAR  Take 50 mg by mouth daily.   metFORMIN  500 MG tablet Commonly known as: GLUCOPHAGE  Take 2 tablets (1,000 mg total) by mouth 2 (two) times daily with a meal.   metoprolol  tartrate 25 MG tablet Commonly known as: LOPRESSOR  Take 12.5 mg by mouth 2 (two) times daily.   OneTouch Verio test strip Generic drug: glucose blood Use as instructed   pantoprazole  40 MG tablet Commonly known as: PROTONIX  Take 40 mg by mouth daily.   Semaglutide  (2 MG/DOSE) 8 MG/3ML Sopn Inject 2 mg as directed once a week.   sildenafil 20 MG tablet Commonly known as: REVATIO SMARTSIG:3-5 Tablet(s) By Mouth Daily         OBJECTIVE:   Vital Signs: There were no vitals taken for this visit.  Wt Readings from Last 3 Encounters:  04/22/24 175 lb (79.4 kg)  12/21/23 175 lb (79.4 kg)  08/17/23 171 lb (77.6 kg)     Exam: General: Pt appears well and is in NAD  Lungs: Clear with good BS bilat   Heart: RRR   Abdomen: Soft, nontender  Extremities: No pretibial edema.   Neuro: MS is good with appropriate affect, pt is alert and Ox3   DM foot exam: 12/21/2023 The skin of the feet is intact without sores or ulcerations. The pedal pulses are 1+ bilaterally  The sensation is intact to a screening 5.07, 10 gram monofilament bilaterally     DATA REVIEWED:  Lab Results  Component Value Date   HGBA1C 7.1 (A) 08/17/2023   HGBA1C 8.0 (A) 05/18/2023   HGBA1C 7.8 (A) 09/29/2022    12/01/2023  Through PCPs office BUN 17 Creatinine 0.9 GFR 94 A1c 7.9   ASSESSMENT / PLAN / RECOMMENDATIONS:   1) Type 2  Diabetes Mellitus, Optimally controlled , With Macrovascular  complications - Most recent A1c of  7.1%. Goal A1c < 7.0 %.    -A1c remains stable -Patient continues with cookies and milk at bedtime, I did entertain the option of switching to sugar-free cookies - No changes at this time    MEDICATIONS: -Continue Metformin  500 mg ,2 tablets in the morning and 2 tablets with Supper  -Continue Jardiance  25 mg , 1 tablet every morning  -Continue Glipizide  5 mg, 1 tablet before breakfast - Continue Ozempic  2 mg weekly  EDUCATION / INSTRUCTIONS: BG monitoring instructions: Patient is instructed to check his blood sugars 1 times a day, fasting . Call [place] [facility]ocr[facility] clinic if: BG persistently < 70  I reviewed the Rule of 15 for the treatment of hypoglycemia in detail with the patient. Literature supplied.   2) Diabetic complications:  Eye: Does not have known diabetic retinopathy. Pt referred to Ophthalmology  Neuro/ Feet: Does not have known diabetic peripheral neuropathy .  Renal: Patient does not have known baseline CKD. He   is  on an ACEI/ARB at present.     F/U in 4 months     Signed electronically by: Stefano Redgie Butts, MD  [facility] Endocrinology  [facility] [address] [place] [place], [place] [address] Phone: [phone] FAX: [phone]   CC: Marvene Prentice SAUNDERS, FNP [address] [place] [address] Phone: [phone]  Fax: [phone]  Return to Endocrinology clinic as below: Future Appointments  Date Time Provider Department Center  04/25/2024  8:30 AM Lakeeta Dobosz, Donell Redgie, MD [facility] None  05/23/2024  8:00 AM Ladona Heinz, MD [facility]       "

## 2024-04-26 ENCOUNTER — Ambulatory Visit: Payer: Self-pay | Admitting: Internal Medicine

## 2024-04-26 LAB — MICROALBUMIN / CREATININE URINE RATIO
Creatinine, Urine: 38 mg/dL (ref 20–320)
Microalb, Ur: <0.2 mg/dL

## 2024-05-23 ENCOUNTER — Ambulatory Visit: Admitting: Cardiology

## 2024-08-23 ENCOUNTER — Ambulatory Visit: Admitting: Internal Medicine
